# Patient Record
Sex: Female | Born: 1945
Health system: Southern US, Community
[De-identification: ages and names within clinical notes are randomized; demographics above are authoritative.]

## PROBLEM LIST (undated history)

## (undated) DIAGNOSIS — Z9109 Other allergy status, other than to drugs and biological substances: Secondary | ICD-10-CM

## (undated) DIAGNOSIS — H409 Unspecified glaucoma: Secondary | ICD-10-CM

## (undated) DIAGNOSIS — M8580 Other specified disorders of bone density and structure, unspecified site: Secondary | ICD-10-CM

## (undated) DIAGNOSIS — G4700 Insomnia, unspecified: Secondary | ICD-10-CM

## (undated) DIAGNOSIS — F32A Depression, unspecified: Secondary | ICD-10-CM

## (undated) DIAGNOSIS — L309 Dermatitis, unspecified: Secondary | ICD-10-CM

## (undated) DIAGNOSIS — F329 Major depressive disorder, single episode, unspecified: Secondary | ICD-10-CM

## (undated) DIAGNOSIS — C55 Malignant neoplasm of uterus, part unspecified: Secondary | ICD-10-CM

## (undated) DIAGNOSIS — C541 Malignant neoplasm of endometrium: Secondary | ICD-10-CM

## (undated) DIAGNOSIS — J45909 Unspecified asthma, uncomplicated: Secondary | ICD-10-CM

## (undated) DIAGNOSIS — R739 Hyperglycemia, unspecified: Secondary | ICD-10-CM

## (undated) DIAGNOSIS — E785 Hyperlipidemia, unspecified: Secondary | ICD-10-CM

## (undated) DIAGNOSIS — E278 Other specified disorders of adrenal gland: Secondary | ICD-10-CM

## (undated) DIAGNOSIS — B019 Varicella without complication: Secondary | ICD-10-CM

## (undated) HISTORY — DX: Malignant neoplasm of endometrium: C54.1

## (undated) HISTORY — DX: Other specified disorders of adrenal gland: E27.8

## (undated) HISTORY — DX: Hyperlipidemia, unspecified: E78.5

## (undated) HISTORY — PX: CATARACT EXTRACTION: SUR2

## (undated) HISTORY — PX: OTHER SURGICAL HISTORY: SHX169

## (undated) HISTORY — DX: Dermatitis, unspecified: L30.9

## (undated) HISTORY — DX: Insomnia, unspecified: G47.00

## (undated) HISTORY — PX: ECTOPIC PREGNANCY SURGERY: SHX613

## (undated) HISTORY — PX: ABDOMINAL HYSTERECTOMY: SHX81

## (undated) HISTORY — DX: Unspecified asthma, uncomplicated: J45.909

## (undated) HISTORY — DX: Malignant neoplasm of uterus, part unspecified: C55

## (undated) HISTORY — PX: BREAST SURGERY: SHX581

## (undated) HISTORY — DX: Unspecified glaucoma: H40.9

## (undated) HISTORY — DX: Varicella without complication: B01.9

## (undated) HISTORY — DX: Hyperglycemia, unspecified: R73.9

## (undated) HISTORY — DX: Depression, unspecified: F32.A

## (undated) HISTORY — DX: Other allergy status, other than to drugs and biological substances: Z91.09

## (undated) HISTORY — DX: Major depressive disorder, single episode, unspecified: F32.9

## (undated) HISTORY — DX: Other specified disorders of bone density and structure, unspecified site: M85.80

---

## 1997-11-07 HISTORY — PX: ABDOMINAL HYSTERECTOMY: SHX81

## 2001-11-07 HISTORY — PX: BREAST SURGERY: SHX581

## 2008-08-27 ENCOUNTER — Encounter: Payer: Self-pay | Admitting: Endocrinology

## 2011-11-15 DIAGNOSIS — M5020 Other cervical disc displacement, unspecified cervical region: Secondary | ICD-10-CM | POA: Diagnosis not present

## 2011-11-15 DIAGNOSIS — M9981 Other biomechanical lesions of cervical region: Secondary | ICD-10-CM | POA: Diagnosis not present

## 2011-11-15 DIAGNOSIS — M5412 Radiculopathy, cervical region: Secondary | ICD-10-CM | POA: Diagnosis not present

## 2011-11-15 DIAGNOSIS — M5126 Other intervertebral disc displacement, lumbar region: Secondary | ICD-10-CM | POA: Diagnosis not present

## 2011-11-23 DIAGNOSIS — M5126 Other intervertebral disc displacement, lumbar region: Secondary | ICD-10-CM | POA: Diagnosis not present

## 2011-11-23 DIAGNOSIS — M9981 Other biomechanical lesions of cervical region: Secondary | ICD-10-CM | POA: Diagnosis not present

## 2011-11-23 DIAGNOSIS — M5412 Radiculopathy, cervical region: Secondary | ICD-10-CM | POA: Diagnosis not present

## 2011-11-23 DIAGNOSIS — M5020 Other cervical disc displacement, unspecified cervical region: Secondary | ICD-10-CM | POA: Diagnosis not present

## 2011-12-19 DIAGNOSIS — H409 Unspecified glaucoma: Secondary | ICD-10-CM | POA: Diagnosis not present

## 2011-12-19 DIAGNOSIS — J309 Allergic rhinitis, unspecified: Secondary | ICD-10-CM | POA: Diagnosis not present

## 2011-12-19 DIAGNOSIS — L259 Unspecified contact dermatitis, unspecified cause: Secondary | ICD-10-CM | POA: Diagnosis not present

## 2011-12-19 DIAGNOSIS — J45909 Unspecified asthma, uncomplicated: Secondary | ICD-10-CM | POA: Diagnosis not present

## 2011-12-21 DIAGNOSIS — M5020 Other cervical disc displacement, unspecified cervical region: Secondary | ICD-10-CM | POA: Diagnosis not present

## 2011-12-21 DIAGNOSIS — M9981 Other biomechanical lesions of cervical region: Secondary | ICD-10-CM | POA: Diagnosis not present

## 2011-12-21 DIAGNOSIS — M5412 Radiculopathy, cervical region: Secondary | ICD-10-CM | POA: Diagnosis not present

## 2012-01-18 DIAGNOSIS — M5412 Radiculopathy, cervical region: Secondary | ICD-10-CM | POA: Diagnosis not present

## 2012-01-18 DIAGNOSIS — M5020 Other cervical disc displacement, unspecified cervical region: Secondary | ICD-10-CM | POA: Diagnosis not present

## 2012-01-18 DIAGNOSIS — M9981 Other biomechanical lesions of cervical region: Secondary | ICD-10-CM | POA: Diagnosis not present

## 2012-02-01 DIAGNOSIS — H4010X0 Unspecified open-angle glaucoma, stage unspecified: Secondary | ICD-10-CM | POA: Diagnosis not present

## 2012-02-02 DIAGNOSIS — H4011X0 Primary open-angle glaucoma, stage unspecified: Secondary | ICD-10-CM | POA: Diagnosis not present

## 2012-03-22 DIAGNOSIS — L259 Unspecified contact dermatitis, unspecified cause: Secondary | ICD-10-CM | POA: Diagnosis not present

## 2012-03-22 DIAGNOSIS — H409 Unspecified glaucoma: Secondary | ICD-10-CM | POA: Diagnosis not present

## 2012-03-22 DIAGNOSIS — J45909 Unspecified asthma, uncomplicated: Secondary | ICD-10-CM | POA: Diagnosis not present

## 2012-03-22 DIAGNOSIS — J309 Allergic rhinitis, unspecified: Secondary | ICD-10-CM | POA: Diagnosis not present

## 2012-04-04 DIAGNOSIS — Z1231 Encounter for screening mammogram for malignant neoplasm of breast: Secondary | ICD-10-CM | POA: Diagnosis not present

## 2012-04-04 DIAGNOSIS — M899 Disorder of bone, unspecified: Secondary | ICD-10-CM | POA: Diagnosis not present

## 2012-04-04 DIAGNOSIS — Z01419 Encounter for gynecological examination (general) (routine) without abnormal findings: Secondary | ICD-10-CM | POA: Diagnosis not present

## 2012-04-04 DIAGNOSIS — Z8542 Personal history of malignant neoplasm of other parts of uterus: Secondary | ICD-10-CM | POA: Diagnosis not present

## 2012-04-04 DIAGNOSIS — M949 Disorder of cartilage, unspecified: Secondary | ICD-10-CM | POA: Diagnosis not present

## 2012-04-24 DIAGNOSIS — M5126 Other intervertebral disc displacement, lumbar region: Secondary | ICD-10-CM | POA: Diagnosis not present

## 2012-04-24 DIAGNOSIS — M999 Biomechanical lesion, unspecified: Secondary | ICD-10-CM | POA: Diagnosis not present

## 2012-04-24 DIAGNOSIS — M5020 Other cervical disc displacement, unspecified cervical region: Secondary | ICD-10-CM | POA: Diagnosis not present

## 2012-04-24 DIAGNOSIS — M5430 Sciatica, unspecified side: Secondary | ICD-10-CM | POA: Diagnosis not present

## 2012-06-07 ENCOUNTER — Other Ambulatory Visit: Payer: Self-pay | Admitting: Speech Pathology

## 2012-06-07 ENCOUNTER — Encounter: Payer: Self-pay | Admitting: Family

## 2012-06-07 ENCOUNTER — Ambulatory Visit (INDEPENDENT_AMBULATORY_CARE_PROVIDER_SITE_OTHER): Payer: Medicare Other | Admitting: Family

## 2012-06-07 VITALS — BP 140/84 | HR 80 | Temp 98.2°F | Resp 16 | Ht 77.0 in | Wt 125.0 lb

## 2012-06-07 DIAGNOSIS — R51 Headache: Secondary | ICD-10-CM

## 2012-06-07 DIAGNOSIS — H31099 Other chorioretinal scars, unspecified eye: Secondary | ICD-10-CM | POA: Diagnosis not present

## 2012-06-07 DIAGNOSIS — H43819 Vitreous degeneration, unspecified eye: Secondary | ICD-10-CM | POA: Diagnosis not present

## 2012-06-07 DIAGNOSIS — H538 Other visual disturbances: Secondary | ICD-10-CM | POA: Diagnosis not present

## 2012-06-07 DIAGNOSIS — R5381 Other malaise: Secondary | ICD-10-CM

## 2012-06-07 DIAGNOSIS — H4010X0 Unspecified open-angle glaucoma, stage unspecified: Secondary | ICD-10-CM | POA: Diagnosis not present

## 2012-06-07 DIAGNOSIS — R5383 Other fatigue: Secondary | ICD-10-CM | POA: Diagnosis not present

## 2012-06-07 DIAGNOSIS — H2510 Age-related nuclear cataract, unspecified eye: Secondary | ICD-10-CM | POA: Diagnosis not present

## 2012-06-07 DIAGNOSIS — R519 Headache, unspecified: Secondary | ICD-10-CM

## 2012-06-07 LAB — SEDIMENTATION RATE: Sed Rate: 9 mm/hr (ref 0–22)

## 2012-06-07 LAB — CBC WITH DIFFERENTIAL/PLATELET
Basophils Relative: 0.6 % (ref 0.0–3.0)
Eosinophils Relative: 5.7 % — ABNORMAL HIGH (ref 0.0–5.0)
Lymphocytes Relative: 20.6 % (ref 12.0–46.0)
Neutrophils Relative %: 66.2 % (ref 43.0–77.0)
RBC: 4.32 Mil/uL (ref 3.87–5.11)
WBC: 9.7 10*3/uL (ref 4.5–10.5)

## 2012-06-07 LAB — COMPREHENSIVE METABOLIC PANEL
Albumin: 3.9 g/dL (ref 3.5–5.2)
BUN: 18 mg/dL (ref 6–23)
Calcium: 9.5 mg/dL (ref 8.4–10.5)
Chloride: 102 mEq/L (ref 96–112)
Glucose, Bld: 85 mg/dL (ref 70–99)
Potassium: 3.9 mEq/L (ref 3.5–5.1)

## 2012-06-07 LAB — GLUCOSE, POCT (MANUAL RESULT ENTRY): POC Glucose: 98 mg/dl (ref 70–99)

## 2012-06-07 NOTE — Patient Instructions (Addendum)
Blurred Vision You have been seen today complaining of blurred vision. This means you have a loss of ability to see small details.  CAUSES  Blurred vision can be a symptom of underlying eye problems, such as:  Aging of the eye (presbyopia).   Glaucoma.   Cataracts.   Eye infection.   Eye-related migraine.   Diabetes mellitus.   Fatigue.   Migraine headaches.   High blood pressure.   Breakdown of the back of the eye (macular degeneration).   Problems caused by some medications.  The most common cause of blurred vision is the need for eyeglasses or a new prescription. Today in the emergency department, no cause for your blurred vision can be found. SYMPTOMS  Blurred vision is the loss of visual sharpness and detail (acuity). DIAGNOSIS  Should blurred vision continue, you should see your caregiver. If your caregiver is your primary care physician, he or she may choose to refer you to another specialist.  TREATMENT  Do not ignore your blurred vision. Make sure to have it checked out to see if further treatment or referral is necessary. SEEK MEDICAL CARE IF:  You are unable to get into a specialist so we can help you with a referral. SEEK IMMEDIATE MEDICAL CARE IF: You have severe eye pain, severe headache, or sudden loss of vision. MAKE SURE YOU:   Understand these instructions.   Will watch your condition.   Will get help right away if you are not doing well or get worse.  Document Released: 10/27/2003 Document Revised: 10/13/2011 Document Reviewed: 05/28/2008 [facility] Patient Information 2012 [place], [place].  Headaches, Frequently Asked Questions MIGRAINE HEADACHES Q: What is migraine? What causes it? How can I treat it? A: Generally, migraine headaches begin as a dull ache. Then they develop into a constant, throbbing, and pulsating pain. You may experience pain at the temples. You may experience pain at the front or back of one or both sides of the head. The pain is  usually accompanied by a combination of:  Nausea.   Vomiting.   Sensitivity to light and noise.  Some people (about 15%) experience an aura (see below) before an attack. The cause of migraine is believed to be chemical reactions in the brain. Treatment for migraine may include over-the-counter or prescription medications. It may also include self-help techniques. These include relaxation training and biofeedback.  Q: What is an aura? A: About 15% of people with migraine get an "aura". This is a sign of neurological symptoms that occur before a migraine headache. You may see wavy or jagged lines, dots, or flashing lights. You might experience tunnel vision or blind spots in one or both eyes. The aura can include visual or auditory hallucinations (something imagined). It may include disruptions in smell (such as strange odors), taste or touch. Other symptoms include:  Numbness.   A "pins and needles" sensation.   Difficulty in recalling or speaking the correct word.  These neurological events may last as long as 60 minutes. These symptoms will fade as the headache begins. Q: What is a trigger? A: Certain physical or environmental factors can lead to or "trigger" a migraine. These include:  Foods.   Hormonal changes.   Weather.   Stress.  It is important to remember that triggers are different for everyone. To help prevent migraine attacks, you need to figure out which triggers affect you. Keep a headache diary. This is a good way to track triggers. The diary will help you talk to  your healthcare professional about your condition. Q: Does weather affect migraines? A: Bright sunshine, hot, humid conditions, and drastic changes in barometric pressure may lead to, or "trigger," a migraine attack in some people. But studies have shown that weather does not act as a trigger for everyone with migraines. Q: What is the link between migraine and hormones? A: Hormones start and regulate many of  your body's functions. Hormones keep your body in balance within a constantly changing environment. The levels of hormones in your body are unbalanced at times. Examples are during menstruation, pregnancy, or menopause. That can lead to a migraine attack. In fact, about three quarters of all women with migraine report that their attacks are related to the menstrual cycle.  Q: Is there an increased risk of stroke for migraine sufferers? A: The likelihood of a migraine attack causing a stroke is very remote. That is not to say that migraine sufferers cannot have a stroke associated with their migraines. In persons under age 86, the most common associated factor for stroke is migraine headache. But over the course of a person's normal life span, the occurrence of migraine headache may actually be associated with a reduced risk of dying from cerebrovascular disease due to stroke.  Q: What are acute medications for migraine? A: Acute medications are used to treat the pain of the headache after it has started. Examples over-the-counter medications, NSAIDs, ergots, and triptans.  Q: What are the triptans? A: Triptans are the newest class of abortive medications. They are specifically targeted to treat migraine. Triptans are vasoconstrictors. They moderate some chemical reactions in the brain. The triptans work on receptors in your brain. Triptans help to restore the balance of a neurotransmitter called serotonin. Fluctuations in levels of serotonin are thought to be a main cause of migraine.  Q: Are over-the-counter medications for migraine effective? A: Over-the-counter, or "OTC," medications may be effective in relieving mild to moderate pain and associated symptoms of migraine. But you should see your caregiver before beginning any treatment regimen for migraine.  Q: What are preventive medications for migraine? A: Preventive medications for migraine are sometimes referred to as "prophylactic" treatments. They  are used to reduce the frequency, severity, and length of migraine attacks. Examples of preventive medications include antiepileptic medications, antidepressants, beta-blockers, calcium channel blockers, and NSAIDs (nonsteroidal anti-inflammatory drugs). Q: Why are anticonvulsants used to treat migraine? A: During the past few years, there has been an increased interest in antiepileptic drugs for the prevention of migraine. They are sometimes referred to as "anticonvulsants". Both epilepsy and migraine may be caused by similar reactions in the brain.  Q: Why are antidepressants used to treat migraine? A: Antidepressants are typically used to treat people with depression. They may reduce migraine frequency by regulating chemical levels, such as serotonin, in the brain.  Q: What alternative therapies are used to treat migraine? A: The term "alternative therapies" is often used to describe treatments considered outside the scope of conventional Western medicine. Examples of alternative therapy include acupuncture, acupressure, and yoga. Another common alternative treatment is herbal therapy. Some herbs are believed to relieve headache pain. Always discuss alternative therapies with your caregiver before proceeding. Some herbal products contain arsenic and other toxins. TENSION HEADACHES Q: What is a tension-type headache? What causes it? How can I treat it? A: Tension-type headaches occur randomly. They are often the result of temporary stress, anxiety, fatigue, or anger. Symptoms include soreness in your temples, a tightening band-like sensation around your head (a "  vice-like" ache). Symptoms can also include a pulling feeling, pressure sensations, and contracting head and neck muscles. The headache begins in your forehead, temples, or the back of your head and neck. Treatment for tension-type headache may include over-the-counter or prescription medications. Treatment may also include self-help techniques  such as relaxation training and biofeedback. CLUSTER HEADACHES Q: What is a cluster headache? What causes it? How can I treat it? A: Cluster headache gets its name because the attacks come in groups. The pain arrives with little, if any, warning. It is usually on one side of the head. A tearing or bloodshot eye and a runny nose on the same side of the headache may also accompany the pain. Cluster headaches are believed to be caused by chemical reactions in the brain. They have been described as the most severe and intense of any headache type. Treatment for cluster headache includes prescription medication and oxygen. SINUS HEADACHES Q: What is a sinus headache? What causes it? How can I treat it? A: When a cavity in the bones of the face and skull (a sinus) becomes inflamed, the inflammation will cause localized pain. This condition is usually the result of an allergic reaction, a tumor, or an infection. If your headache is caused by a sinus blockage, such as an infection, you will probably have a fever. An x-ray will confirm a sinus blockage. Your caregiver's treatment might include antibiotics for the infection, as well as antihistamines or decongestants.  REBOUND HEADACHES Q: What is a rebound headache? What causes it? How can I treat it? A: A pattern of taking acute headache medications too often can lead to a condition known as "rebound headache." A pattern of taking too much headache medication includes taking it more than 2 days per week or in excessive amounts. That means more than the label or a caregiver advises. With rebound headaches, your medications not only stop relieving pain, they actually begin to cause headaches. Doctors treat rebound headache by tapering the medication that is being overused. Sometimes your caregiver will gradually substitute a different type of treatment or medication. Stopping may be a challenge. Regularly overusing a medication increases the potential for serious  side effects. Consult a caregiver if you regularly use headache medications more than 2 days per week or more than the label advises. ADDITIONAL QUESTIONS AND ANSWERS Q: What is biofeedback? A: Biofeedback is a self-help treatment. Biofeedback uses special equipment to monitor your body's involuntary physical responses. Biofeedback monitors:  Breathing.   Pulse.   Heart rate.   Temperature.   Muscle tension.   Brain activity.  Biofeedback helps you refine and perfect your relaxation exercises. You learn to control the physical responses that are related to stress. Once the technique has been mastered, you do not need the equipment any more. Q: Are headaches hereditary? A: Four out of five (80%) of people that suffer report a family history of migraine. Scientists are not sure if this is genetic or a family predisposition. Despite the uncertainty, a child has a 50% chance of having migraine if one parent suffers. The child has a 75% chance if both parents suffer.  Q: Can children get headaches? A: By the time they reach high school, most young people have experienced some type of headache. Many safe and effective approaches or medications can prevent a headache from occurring or stop it after it has begun.  Q: What type of doctor should I see to diagnose and treat my headache? A: Start with your  primary caregiver. Discuss his or her experience and approach to headaches. Discuss methods of classification, diagnosis, and treatment. Your caregiver may decide to recommend you to a headache specialist, depending upon your symptoms or other physical conditions. Having diabetes, allergies, etc., may require a more comprehensive and inclusive approach to your headache. The National Headache Foundation will provide, upon request, a list of [facility] physician members in your state. Document Released: 01/14/2004 Document Revised: 10/13/2011 Document Reviewed: 06/23/2008 [facility] Patient Information 2012  [place], [place].

## 2012-06-07 NOTE — Progress Notes (Signed)
Subjective:    Patient ID: Kelly Petersen, female    DOB: 1946/09/12, 66 y.o.   MRN: [id number]  HPI 66 year old WF, new patient to the practice, is in with c/o aura, headache to the back of her head, blurred vision, and fatigue. She reports symptoms beginning suddenly 10 days ago. She is worried she has had a TIA. She has a history of migraines without headache but has had aura. Rates headache 4/10, lasting 4-6 hours a day. She takes ASA that helps. Denies nausea, vomiting, sensitivity to light or noise. She is stressed having 2 sick parents. She has a mother with advanced Alzheimer's disease and a father with cancer. She is also concerned that her blood pressure is elevated more than usual.      Review of Systems  Constitutional: Positive for fatigue.  Eyes: Positive for visual disturbance.  Respiratory: Negative.   Cardiovascular: Negative.   Gastrointestinal: Negative.   Genitourinary: Negative.   Musculoskeletal: Negative.   Skin: Negative.   Neurological: Positive for weakness and headaches.  Hematological: Negative.   Psychiatric/Behavioral: Negative.    Past Medical History  Diagnosis Date  . Endometrial cancer   . Chicken pox   . Depression   . Hyperlipidemia   . Glaucoma     History   Social History  . Marital Status: Single    Spouse Name: N/A    Number of Children: N/A  . Years of Education: N/A   Occupational History  . Not on file.   Social History Main Topics  . Smoking status: Never Smoker   . Smokeless tobacco: Not on file  . Alcohol Use: Yes  . Drug Use: No  . Sexually Active: Not on file   Other Topics Concern  . Not on file   Social History Narrative  . No narrative on file    Past Surgical History  Procedure Date  . Breast surgery   . 2004   . Abdominal hysterectomy     Family History  Problem Relation Age of Onset  . Lung cancer    . Hyperlipidemia      Not on File  Current Outpatient Prescriptions on File Prior to Visit    Medication Sig Dispense Refill  . mometasone (ASMANEX) 220 MCG/INH inhaler Inhale 4 puffs into the lungs daily.      . montelukast (SINGULAIR) 10 MG tablet Take 10 mg by mouth at bedtime.      . rosuvastatin (CRESTOR) 5 MG tablet Take 5 mg by mouth daily.      . sertraline (ZOLOFT) 50 MG tablet Take 50 mg by mouth daily.      . traZODone (DESYREL) 50 MG tablet Take 50 mg by mouth at bedtime.        BP 140/84  Pulse 80  Temp 98.2 F (36.8 C)  Resp 16  Ht 6\' 5"  (1.956 m)  Wt 125 lb (56.7 kg)  BMI 14.82 kg/m2chart    Objective:   Physical Exam  Constitutional: She is oriented to person, place, and time. She appears well-developed and well-nourished.  HENT:  Right Ear: External ear normal.  Left Ear: External ear normal.  Nose: Nose normal.  Mouth/Throat: Oropharynx is clear and moist.  Eyes: Conjunctivae and EOM are normal. Pupils are equal, round, and reactive to light.  Neck: Normal range of motion. Neck supple. No thyromegaly present.  Cardiovascular: Normal rate, regular rhythm and normal heart sounds.   Pulmonary/Chest: Effort normal and breath sounds normal.  Abdominal: Soft.  Bowel sounds are normal.  Musculoskeletal: Normal range of motion. She exhibits no edema and no tenderness.  Neurological: She is alert and oriented to person, place, and time. She has normal reflexes. She displays normal reflexes. No cranial nerve deficit. She exhibits normal muscle tone. Coordination normal.  Skin: Skin is warm and dry.  Psychiatric: She has a normal mood and affect.          Assessment & Plan:  Assessment: Headache-occipital, Fatigue, Blurred Vision   Plan: Labs sent. CT scan of the head ordered. Will notify patient pending results.

## 2012-06-08 ENCOUNTER — Ambulatory Visit (INDEPENDENT_AMBULATORY_CARE_PROVIDER_SITE_OTHER)
Admission: RE | Admit: 2012-06-08 | Discharge: 2012-06-08 | Disposition: A | Discharge status: Home / Self Care | Payer: Medicare Other | Source: Ambulatory Visit | Attending: Family | Admitting: Family

## 2012-06-08 ENCOUNTER — Telehealth: Payer: Self-pay | Admitting: Family

## 2012-06-08 DIAGNOSIS — H538 Other visual disturbances: Secondary | ICD-10-CM | POA: Diagnosis not present

## 2012-06-08 DIAGNOSIS — R5381 Other malaise: Secondary | ICD-10-CM | POA: Diagnosis not present

## 2012-06-08 DIAGNOSIS — R5383 Other fatigue: Secondary | ICD-10-CM | POA: Diagnosis not present

## 2012-06-08 DIAGNOSIS — R519 Headache, unspecified: Secondary | ICD-10-CM

## 2012-06-08 DIAGNOSIS — R51 Headache: Secondary | ICD-10-CM

## 2012-06-08 NOTE — Telephone Encounter (Signed)
Pt aware or lab and CT results and results mailed, per pt request

## 2012-06-08 NOTE — Telephone Encounter (Signed)
Pt would like ct scan/blood work results

## 2012-06-11 ENCOUNTER — Telehealth: Payer: Self-pay | Admitting: Family

## 2012-06-11 NOTE — Telephone Encounter (Signed)
Consider an med for stress like Lexapro once daily. Let me know what she wants to do.

## 2012-06-11 NOTE — Telephone Encounter (Signed)
Caller: Kelly Petersen; PCP: Adline Mango; CB#: [phone]; ; ; Patient was seen on 06/07/12 for headaches, blurry vision, etc. Patient was sent for CT scan and had a eye doctor visit. Patient was called with a negative result of CT scan. Patient reports headache x 2 weeks (05/29/12). Patient concerned that her blood pressure was elevated when she went to see the doctor. She wants to know what to do for the headaches and blurry vision. All emergent symptoms ruled out per Hypertension, Diagnosed or Suspected guideline with exception to "Single elevated blood pressure reading and has questions." Home care advice given.

## 2012-07-04 ENCOUNTER — Other Ambulatory Visit: Payer: Self-pay | Admitting: Specialist

## 2012-07-04 DIAGNOSIS — Z79899 Other long term (current) drug therapy: Secondary | ICD-10-CM | POA: Diagnosis not present

## 2012-07-04 DIAGNOSIS — H539 Unspecified visual disturbance: Secondary | ICD-10-CM

## 2012-07-04 DIAGNOSIS — G43119 Migraine with aura, intractable, without status migrainosus: Secondary | ICD-10-CM | POA: Diagnosis not present

## 2012-07-05 ENCOUNTER — Ambulatory Visit
Admission: RE | Admit: 2012-07-05 | Discharge: 2012-07-05 | Disposition: A | Discharge status: Home / Self Care | Payer: Medicare Other | Source: Ambulatory Visit | Attending: Specialist | Admitting: Specialist

## 2012-07-05 DIAGNOSIS — R51 Headache: Secondary | ICD-10-CM | POA: Diagnosis not present

## 2012-07-05 DIAGNOSIS — H539 Unspecified visual disturbance: Secondary | ICD-10-CM

## 2012-07-05 MED ORDER — GADOBENATE DIMEGLUMINE 529 MG/ML IV SOLN
10.0000 mL | Freq: Once | INTRAVENOUS | Status: AC | PRN
  Administered 2012-07-05: 10 mL via INTRAVENOUS

## 2012-07-11 ENCOUNTER — Other Ambulatory Visit: Payer: Medicare Other

## 2012-07-23 DIAGNOSIS — Z1231 Encounter for screening mammogram for malignant neoplasm of breast: Secondary | ICD-10-CM | POA: Diagnosis not present

## 2012-07-23 DIAGNOSIS — M899 Disorder of bone, unspecified: Secondary | ICD-10-CM | POA: Diagnosis not present

## 2012-07-23 DIAGNOSIS — M949 Disorder of cartilage, unspecified: Secondary | ICD-10-CM | POA: Diagnosis not present

## 2012-07-24 DIAGNOSIS — M9981 Other biomechanical lesions of cervical region: Secondary | ICD-10-CM | POA: Diagnosis not present

## 2012-07-24 DIAGNOSIS — R51 Headache: Secondary | ICD-10-CM | POA: Diagnosis not present

## 2012-07-24 DIAGNOSIS — M5412 Radiculopathy, cervical region: Secondary | ICD-10-CM | POA: Diagnosis not present

## 2012-07-24 DIAGNOSIS — M5020 Other cervical disc displacement, unspecified cervical region: Secondary | ICD-10-CM | POA: Diagnosis not present

## 2012-07-26 DIAGNOSIS — L819 Disorder of pigmentation, unspecified: Secondary | ICD-10-CM | POA: Diagnosis not present

## 2012-07-26 DIAGNOSIS — D485 Neoplasm of uncertain behavior of skin: Secondary | ICD-10-CM | POA: Diagnosis not present

## 2012-07-26 DIAGNOSIS — L820 Inflamed seborrheic keratosis: Secondary | ICD-10-CM | POA: Diagnosis not present

## 2012-07-26 DIAGNOSIS — D239 Other benign neoplasm of skin, unspecified: Secondary | ICD-10-CM | POA: Diagnosis not present

## 2012-07-26 DIAGNOSIS — D492 Neoplasm of unspecified behavior of bone, soft tissue, and skin: Secondary | ICD-10-CM | POA: Diagnosis not present

## 2012-08-07 DIAGNOSIS — H4011X0 Primary open-angle glaucoma, stage unspecified: Secondary | ICD-10-CM | POA: Diagnosis not present

## 2012-08-09 DIAGNOSIS — Z23 Encounter for immunization: Secondary | ICD-10-CM | POA: Diagnosis not present

## 2012-08-09 DIAGNOSIS — J45909 Unspecified asthma, uncomplicated: Secondary | ICD-10-CM | POA: Diagnosis not present

## 2012-08-09 DIAGNOSIS — H409 Unspecified glaucoma: Secondary | ICD-10-CM | POA: Diagnosis not present

## 2012-08-09 DIAGNOSIS — J309 Allergic rhinitis, unspecified: Secondary | ICD-10-CM | POA: Diagnosis not present

## 2012-08-13 DIAGNOSIS — C7490 Malignant neoplasm of unspecified part of unspecified adrenal gland: Secondary | ICD-10-CM | POA: Diagnosis not present

## 2012-08-21 DIAGNOSIS — M5020 Other cervical disc displacement, unspecified cervical region: Secondary | ICD-10-CM | POA: Diagnosis not present

## 2012-08-21 DIAGNOSIS — M9981 Other biomechanical lesions of cervical region: Secondary | ICD-10-CM | POA: Diagnosis not present

## 2012-08-21 DIAGNOSIS — M5412 Radiculopathy, cervical region: Secondary | ICD-10-CM | POA: Diagnosis not present

## 2012-08-21 DIAGNOSIS — R51 Headache: Secondary | ICD-10-CM | POA: Diagnosis not present

## 2012-09-25 DIAGNOSIS — M999 Biomechanical lesion, unspecified: Secondary | ICD-10-CM | POA: Diagnosis not present

## 2012-09-25 DIAGNOSIS — M5137 Other intervertebral disc degeneration, lumbosacral region: Secondary | ICD-10-CM | POA: Diagnosis not present

## 2012-09-25 DIAGNOSIS — M5124 Other intervertebral disc displacement, thoracic region: Secondary | ICD-10-CM | POA: Diagnosis not present

## 2012-09-25 DIAGNOSIS — M5414 Radiculopathy, thoracic region: Secondary | ICD-10-CM | POA: Diagnosis not present

## 2012-12-11 DIAGNOSIS — J309 Allergic rhinitis, unspecified: Secondary | ICD-10-CM | POA: Diagnosis not present

## 2012-12-11 DIAGNOSIS — H409 Unspecified glaucoma: Secondary | ICD-10-CM | POA: Diagnosis not present

## 2012-12-11 DIAGNOSIS — J45909 Unspecified asthma, uncomplicated: Secondary | ICD-10-CM | POA: Diagnosis not present

## 2012-12-17 DIAGNOSIS — M5126 Other intervertebral disc displacement, lumbar region: Secondary | ICD-10-CM | POA: Diagnosis not present

## 2012-12-17 DIAGNOSIS — M999 Biomechanical lesion, unspecified: Secondary | ICD-10-CM | POA: Diagnosis not present

## 2012-12-17 DIAGNOSIS — M5430 Sciatica, unspecified side: Secondary | ICD-10-CM | POA: Diagnosis not present

## 2013-03-26 DIAGNOSIS — H4011X0 Primary open-angle glaucoma, stage unspecified: Secondary | ICD-10-CM | POA: Diagnosis not present

## 2013-04-16 DIAGNOSIS — M5412 Radiculopathy, cervical region: Secondary | ICD-10-CM | POA: Diagnosis not present

## 2013-04-16 DIAGNOSIS — M5020 Other cervical disc displacement, unspecified cervical region: Secondary | ICD-10-CM | POA: Diagnosis not present

## 2013-04-16 DIAGNOSIS — M9981 Other biomechanical lesions of cervical region: Secondary | ICD-10-CM | POA: Diagnosis not present

## 2013-04-30 DIAGNOSIS — H4011X0 Primary open-angle glaucoma, stage unspecified: Secondary | ICD-10-CM | POA: Diagnosis not present

## 2013-05-08 ENCOUNTER — Encounter: Payer: Self-pay | Admitting: Family

## 2013-05-08 ENCOUNTER — Ambulatory Visit (INDEPENDENT_AMBULATORY_CARE_PROVIDER_SITE_OTHER): Payer: Medicare Other | Admitting: Family

## 2013-05-08 VITALS — BP 96/60 | HR 90 | Wt 122.0 lb

## 2013-05-08 DIAGNOSIS — L282 Other prurigo: Secondary | ICD-10-CM | POA: Diagnosis not present

## 2013-05-08 DIAGNOSIS — L259 Unspecified contact dermatitis, unspecified cause: Secondary | ICD-10-CM | POA: Diagnosis not present

## 2013-05-08 MED ORDER — METHYLPREDNISOLONE 4 MG PO KIT: PACK | ORAL | Status: AC

## 2013-05-08 NOTE — Progress Notes (Signed)
Subjective:    Patient ID: Kelly Petersen, female    DOB: 1946-01-11, 67 y.o.   MRN: [id number]  HPI Pt is a 67 year old white female who presents to PCP with suspected exposure to poison ivy to BUE and BLE x a week and a half. Rash started after doing yard work at home. Pt describes rash as "itchy"; denies any SOB or chest tightness with the onset of rash. Pt has used peroxide spray to affected areas, in which she states the solution has helped dry out the rash and takes a Benadryl at night which helps relieves the itching. Despite treatments at home new areas of rash continue to appear and the itching is persistent.    Review of Systems  Constitutional: Negative.   HENT: Negative.   Eyes: Negative.   Respiratory: Negative.   Cardiovascular: Negative.   Gastrointestinal: Negative.   Endocrine: Negative.   Genitourinary: Negative.   Musculoskeletal: Negative.   Skin: Positive for rash.  Allergic/Immunologic: Negative.   Neurological: Negative.   Hematological: Negative.   Psychiatric/Behavioral: Negative.        Past Medical History  Diagnosis Date  . Endometrial cancer   . Chicken pox   . Depression   . Hyperlipidemia   . Glaucoma     History   Social History  . Marital Status: Single    Spouse Name: N/A    Number of Children: N/A  . Years of Education: N/A   Occupational History  . Not on file.   Social History Main Topics  . Smoking status: Never Smoker   . Smokeless tobacco: Not on file  . Alcohol Use: Yes  . Drug Use: No  . Sexually Active: Not on file   Other Topics Concern  . Not on file   Social History Narrative  . No narrative on file    Past Surgical History  Procedure Laterality Date  . Breast surgery    . 2004    . Abdominal hysterectomy      Family History  Problem Relation Age of Onset  . Lung cancer    . Hyperlipidemia      No Known Allergies  Current Outpatient Prescriptions on File Prior to Visit  Medication Sig Dispense  Refill  . Brimonidine Tartrate-Timolol (COMBIGAN OP) Apply 1 drop to eye 2 (two) times daily.      . montelukast (SINGULAIR) 10 MG tablet Take 10 mg by mouth at bedtime.      . raloxifene (EVISTA) 60 MG tablet Take 60 mg by mouth daily.      . rosuvastatin (CRESTOR) 5 MG tablet Take 5 mg by mouth daily.      . sertraline (ZOLOFT) 50 MG tablet Take 50 mg by mouth daily.      . traZODone (DESYREL) 50 MG tablet Take 50 mg by mouth at bedtime.      . mometasone (ASMANEX) 220 MCG/INH inhaler Inhale 4 puffs into the lungs daily.       No current facility-administered medications on file prior to visit.    BP 96/60  Pulse 90  Wt 122 lb (55.339 kg)  BMI 14.46 kg/m2  SpO2 97%chart                  Objective:   Physical Exam  Constitutional: She is oriented to person, place, and time. She appears well-developed and well-nourished.  HENT:  Head: Normocephalic and atraumatic.  Eyes: Conjunctivae are normal. Pupils are equal, round, and reactive to light.  Neck: Normal range of motion.  Cardiovascular: Normal rate and regular rhythm.   Pulmonary/Chest: Effort normal and breath sounds normal.  Abdominal: Soft. Bowel sounds are normal.  Musculoskeletal: Normal range of motion.  Neurological: She is alert and oriented to person, place, and time.  Skin: Skin is warm and dry. Rash noted. There is erythema.  Papular rash to BUE and BLE; areas are dry with no drainage/discharge present          Assessment & Plan:  1. Contact Dermatitis  2. Pruritus   Pt prescribed Medrol pack and instructed to continue with Benadryl at home. Pt to contact PCP if symptoms worsen or persist.   Note by [facility], FNP Student

## 2013-05-08 NOTE — Patient Instructions (Signed)

## 2013-06-13 IMAGING — CT CT HEAD W/O CM
1 of 2 series · 16 of 30 positions shown, 20 images · non-contrast
Comparison: None.

CLINICAL DATA: Blurred vision.  Headache

CT HEAD WITHOUT CONTRAST
TECHNIQUE: Contiguous axial images were obtained from the base of
the skull through the vertex without contrast.

[Series 2: head_seq -c 4.5 h37s st · axial · 0.40mm/px · z∈[-112,+14]mm · 16 of 32 slices shown, 20 images]
[im 2/32  brain]
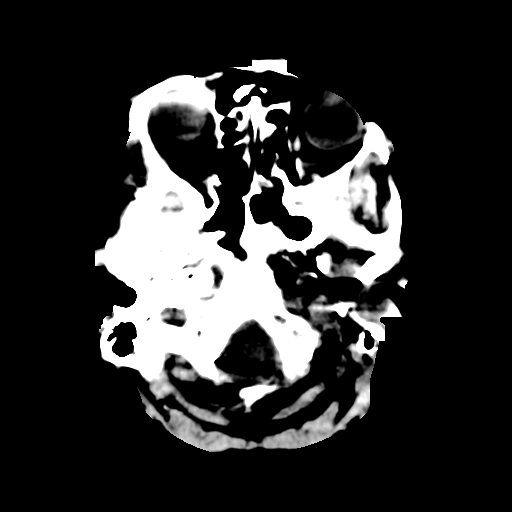
[im 2/32  bone]
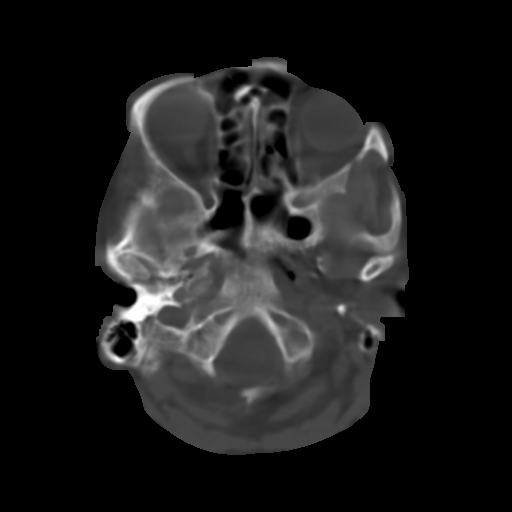
[im 5/32  brain]
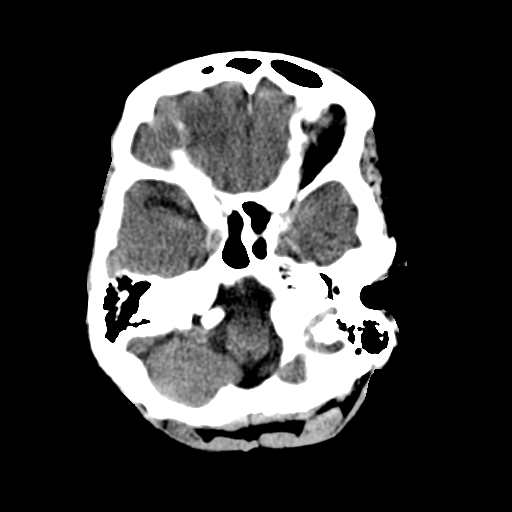
[im 6/32  brain]
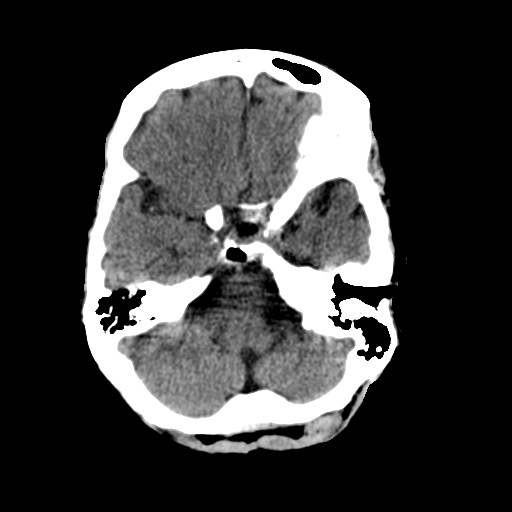
[im 7/32  brain]
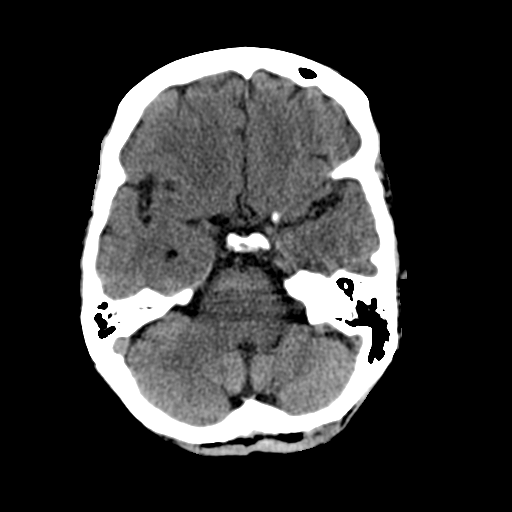
[im 10/32  brain]
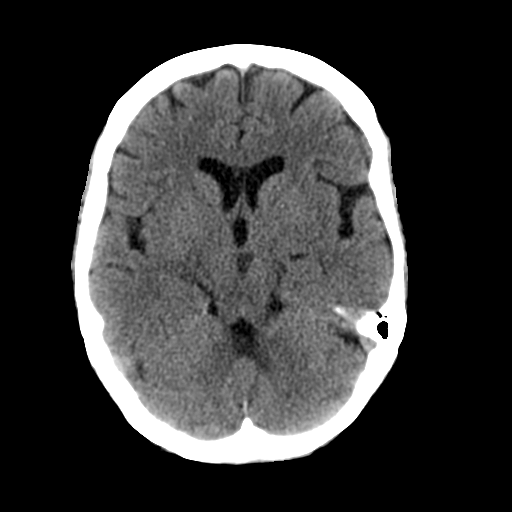
[im 10/32  bone]
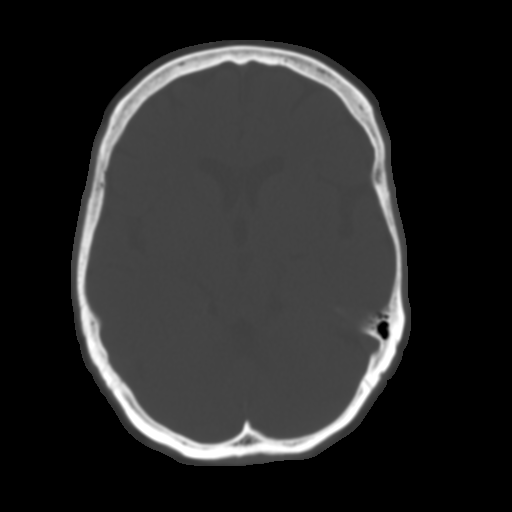
[im 11/32  brain]
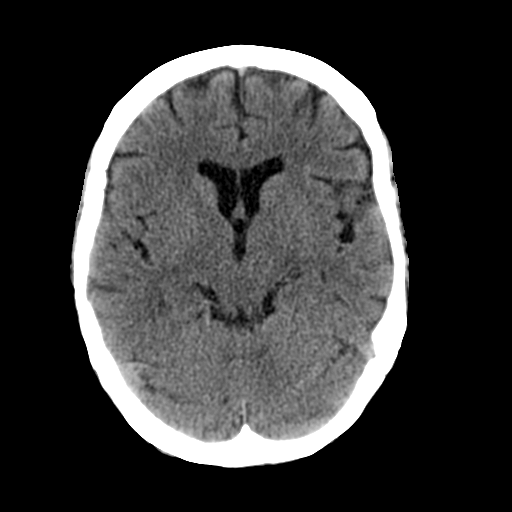
[im 13/32  brain]
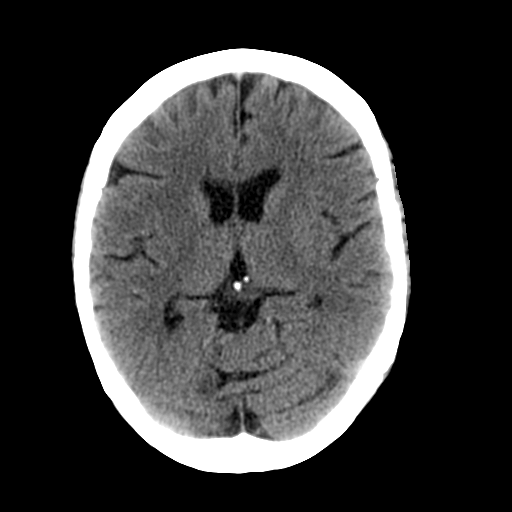
[im 15/32  brain]
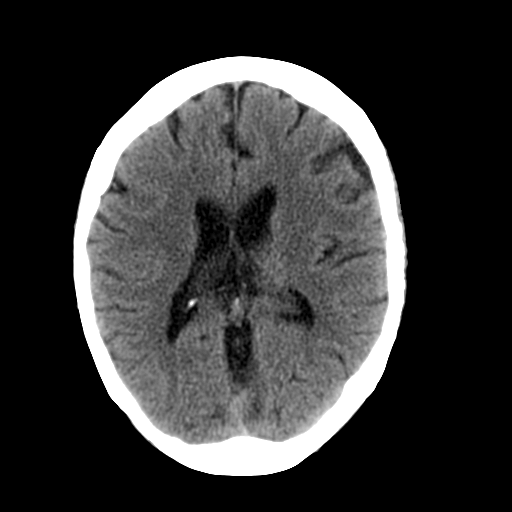
[im 17/32  brain]
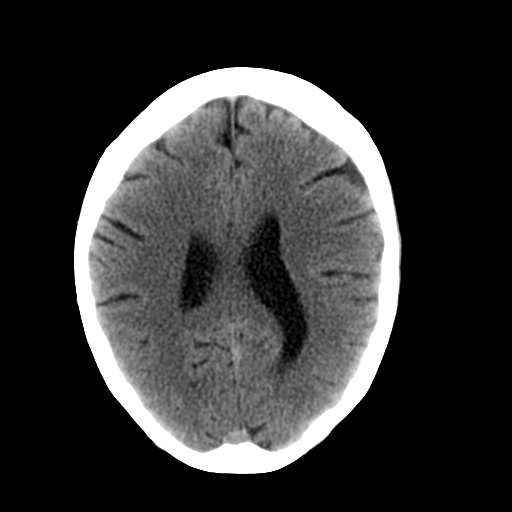
[im 17/32  bone]
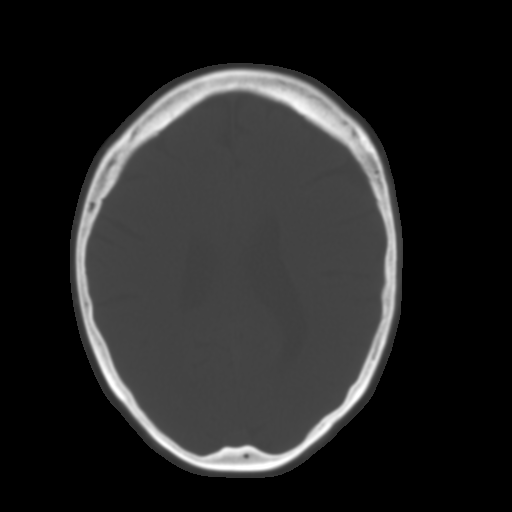
[im 19/32  brain]
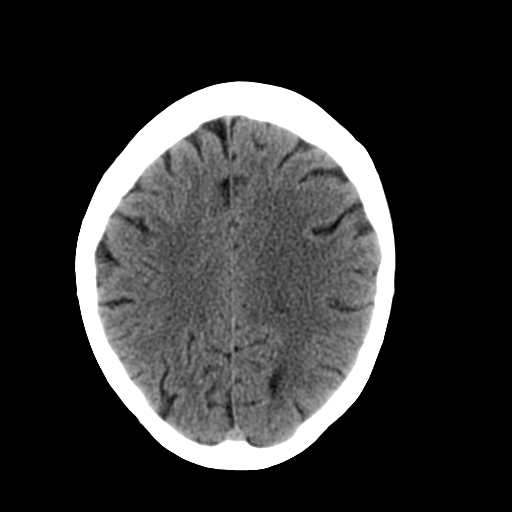
[im 21/32  brain]
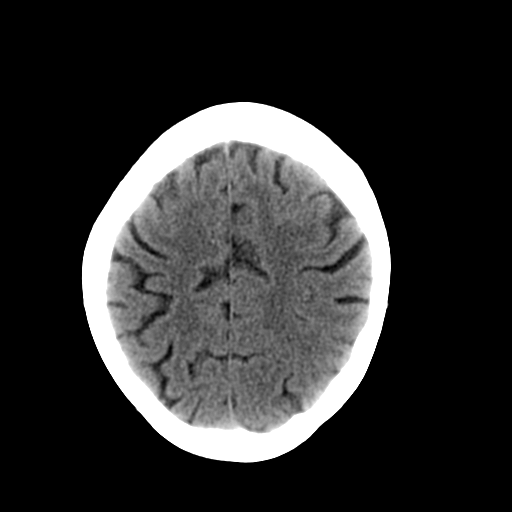
[im 22/32  brain]
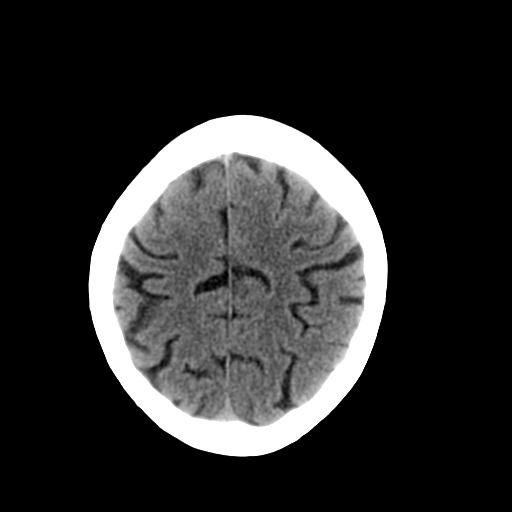
[im 25/32  brain]
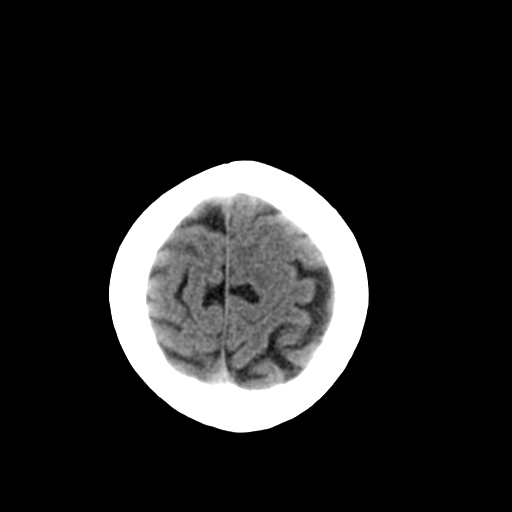
[im 25/32  bone]
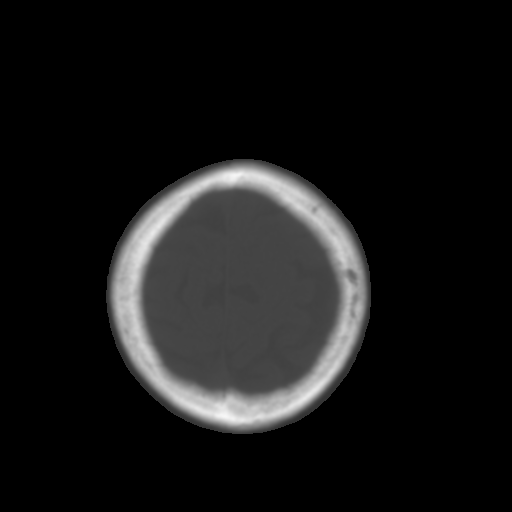
[im 26/32  brain]
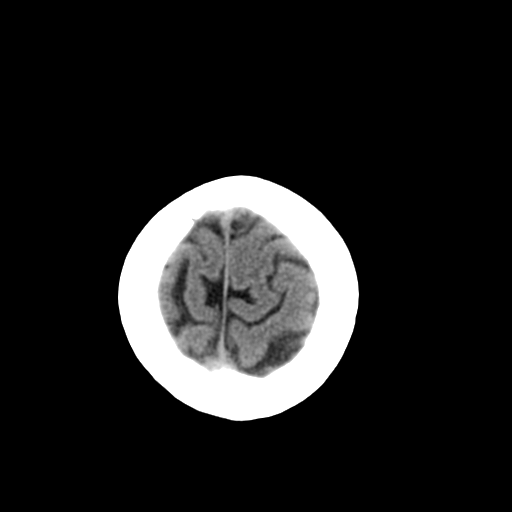
[im 27/32  brain]
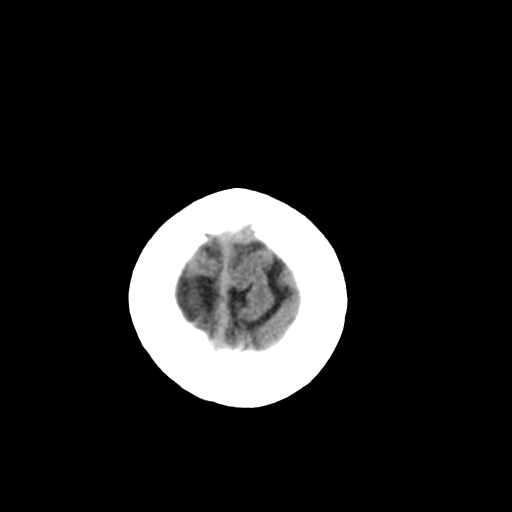
[im 30/32  brain]
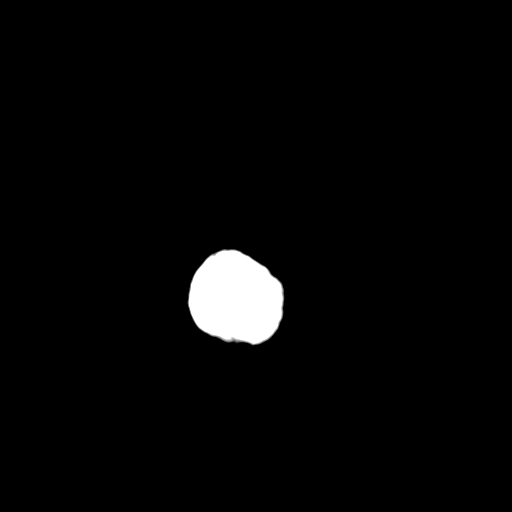

[16 of 30 positions shown; findings below may reference images not displayed]

FINDINGS: Ventricle size is normal.  Age appropriate atrophy.
Negative for acute or chronic infarct.  Negative for hemorrhage or
mass.

Mucosal thickening in the ethmoid sinuses bilaterally.  Calvarium
is intact.
IMPRESSION: No acute intracranial abnormality.

Mild chronic sinusitis.

## 2013-06-25 DIAGNOSIS — H4010X0 Unspecified open-angle glaucoma, stage unspecified: Secondary | ICD-10-CM | POA: Diagnosis not present

## 2013-06-27 DIAGNOSIS — Z23 Encounter for immunization: Secondary | ICD-10-CM | POA: Diagnosis not present

## 2013-06-28 DIAGNOSIS — H409 Unspecified glaucoma: Secondary | ICD-10-CM | POA: Diagnosis not present

## 2013-06-28 DIAGNOSIS — J309 Allergic rhinitis, unspecified: Secondary | ICD-10-CM | POA: Diagnosis not present

## 2013-06-28 DIAGNOSIS — Z8542 Personal history of malignant neoplasm of other parts of uterus: Secondary | ICD-10-CM | POA: Diagnosis not present

## 2013-06-28 DIAGNOSIS — M899 Disorder of bone, unspecified: Secondary | ICD-10-CM | POA: Diagnosis not present

## 2013-06-28 DIAGNOSIS — Z1231 Encounter for screening mammogram for malignant neoplasm of breast: Secondary | ICD-10-CM | POA: Diagnosis not present

## 2013-06-28 DIAGNOSIS — J45909 Unspecified asthma, uncomplicated: Secondary | ICD-10-CM | POA: Diagnosis not present

## 2013-06-28 DIAGNOSIS — Z01419 Encounter for gynecological examination (general) (routine) without abnormal findings: Secondary | ICD-10-CM | POA: Diagnosis not present

## 2013-07-02 DIAGNOSIS — M5430 Sciatica, unspecified side: Secondary | ICD-10-CM | POA: Diagnosis not present

## 2013-07-02 DIAGNOSIS — M5126 Other intervertebral disc displacement, lumbar region: Secondary | ICD-10-CM | POA: Diagnosis not present

## 2013-07-02 DIAGNOSIS — M999 Biomechanical lesion, unspecified: Secondary | ICD-10-CM | POA: Diagnosis not present

## 2013-07-15 DIAGNOSIS — Z1231 Encounter for screening mammogram for malignant neoplasm of breast: Secondary | ICD-10-CM | POA: Diagnosis not present

## 2013-07-19 DIAGNOSIS — D239 Other benign neoplasm of skin, unspecified: Secondary | ICD-10-CM | POA: Diagnosis not present

## 2013-07-19 DIAGNOSIS — D485 Neoplasm of uncertain behavior of skin: Secondary | ICD-10-CM | POA: Diagnosis not present

## 2013-07-19 DIAGNOSIS — D492 Neoplasm of unspecified behavior of bone, soft tissue, and skin: Secondary | ICD-10-CM | POA: Diagnosis not present

## 2013-07-19 DIAGNOSIS — L820 Inflamed seborrheic keratosis: Secondary | ICD-10-CM | POA: Diagnosis not present

## 2013-07-22 DIAGNOSIS — R222 Localized swelling, mass and lump, trunk: Secondary | ICD-10-CM | POA: Diagnosis not present

## 2013-07-23 DIAGNOSIS — E279 Disorder of adrenal gland, unspecified: Secondary | ICD-10-CM | POA: Diagnosis not present

## 2013-07-23 DIAGNOSIS — Z8541 Personal history of malignant neoplasm of cervix uteri: Secondary | ICD-10-CM | POA: Diagnosis not present

## 2013-08-12 ENCOUNTER — Encounter: Payer: Self-pay | Admitting: *Deleted

## 2013-08-12 DIAGNOSIS — E039 Hypothyroidism, unspecified: Secondary | ICD-10-CM | POA: Diagnosis not present

## 2013-08-12 DIAGNOSIS — C7490 Malignant neoplasm of unspecified part of unspecified adrenal gland: Secondary | ICD-10-CM | POA: Diagnosis not present

## 2013-08-16 DIAGNOSIS — Z01818 Encounter for other preprocedural examination: Secondary | ICD-10-CM | POA: Diagnosis not present

## 2013-08-16 DIAGNOSIS — Z1289 Encounter for screening for malignant neoplasm of other sites: Secondary | ICD-10-CM | POA: Diagnosis not present

## 2013-08-16 DIAGNOSIS — K2100 Gastro-esophageal reflux disease with esophagitis, without bleeding: Secondary | ICD-10-CM | POA: Diagnosis not present

## 2013-08-29 DIAGNOSIS — D485 Neoplasm of uncertain behavior of skin: Secondary | ICD-10-CM | POA: Diagnosis not present

## 2013-08-29 DIAGNOSIS — L820 Inflamed seborrheic keratosis: Secondary | ICD-10-CM | POA: Diagnosis not present

## 2013-08-29 DIAGNOSIS — L723 Sebaceous cyst: Secondary | ICD-10-CM | POA: Diagnosis not present

## 2013-11-16 DIAGNOSIS — J111 Influenza due to unidentified influenza virus with other respiratory manifestations: Secondary | ICD-10-CM | POA: Diagnosis not present

## 2013-12-09 DIAGNOSIS — S93409A Sprain of unspecified ligament of unspecified ankle, initial encounter: Secondary | ICD-10-CM | POA: Diagnosis not present

## 2013-12-18 DIAGNOSIS — R12 Heartburn: Secondary | ICD-10-CM | POA: Diagnosis not present

## 2013-12-18 DIAGNOSIS — K219 Gastro-esophageal reflux disease without esophagitis: Secondary | ICD-10-CM | POA: Diagnosis not present

## 2013-12-18 DIAGNOSIS — K6289 Other specified diseases of anus and rectum: Secondary | ICD-10-CM | POA: Diagnosis not present

## 2013-12-18 DIAGNOSIS — K319 Disease of stomach and duodenum, unspecified: Secondary | ICD-10-CM | POA: Diagnosis not present

## 2013-12-18 DIAGNOSIS — Z5181 Encounter for therapeutic drug level monitoring: Secondary | ICD-10-CM | POA: Diagnosis not present

## 2013-12-18 DIAGNOSIS — Z1211 Encounter for screening for malignant neoplasm of colon: Secondary | ICD-10-CM | POA: Diagnosis not present

## 2013-12-18 DIAGNOSIS — K2100 Gastro-esophageal reflux disease with esophagitis, without bleeding: Secondary | ICD-10-CM | POA: Diagnosis not present

## 2013-12-18 LAB — HM COLONOSCOPY: HM COLON: NORMAL

## 2013-12-19 DIAGNOSIS — H4011X0 Primary open-angle glaucoma, stage unspecified: Secondary | ICD-10-CM | POA: Diagnosis not present

## 2013-12-20 DIAGNOSIS — K2100 Gastro-esophageal reflux disease with esophagitis, without bleeding: Secondary | ICD-10-CM | POA: Diagnosis not present

## 2013-12-25 DIAGNOSIS — J45909 Unspecified asthma, uncomplicated: Secondary | ICD-10-CM | POA: Diagnosis not present

## 2013-12-25 DIAGNOSIS — E785 Hyperlipidemia, unspecified: Secondary | ICD-10-CM | POA: Diagnosis not present

## 2013-12-25 DIAGNOSIS — J3089 Other allergic rhinitis: Secondary | ICD-10-CM | POA: Diagnosis not present

## 2013-12-30 DIAGNOSIS — M5126 Other intervertebral disc displacement, lumbar region: Secondary | ICD-10-CM | POA: Diagnosis not present

## 2013-12-30 DIAGNOSIS — M999 Biomechanical lesion, unspecified: Secondary | ICD-10-CM | POA: Diagnosis not present

## 2013-12-30 DIAGNOSIS — M5430 Sciatica, unspecified side: Secondary | ICD-10-CM | POA: Diagnosis not present

## 2014-01-13 DIAGNOSIS — M5430 Sciatica, unspecified side: Secondary | ICD-10-CM | POA: Diagnosis not present

## 2014-01-13 DIAGNOSIS — M5126 Other intervertebral disc displacement, lumbar region: Secondary | ICD-10-CM | POA: Diagnosis not present

## 2014-01-13 DIAGNOSIS — M999 Biomechanical lesion, unspecified: Secondary | ICD-10-CM | POA: Diagnosis not present

## 2014-01-23 DIAGNOSIS — H409 Unspecified glaucoma: Secondary | ICD-10-CM | POA: Diagnosis not present

## 2014-01-23 DIAGNOSIS — J45909 Unspecified asthma, uncomplicated: Secondary | ICD-10-CM | POA: Diagnosis not present

## 2014-01-23 DIAGNOSIS — J309 Allergic rhinitis, unspecified: Secondary | ICD-10-CM | POA: Diagnosis not present

## 2014-03-19 DIAGNOSIS — M5020 Other cervical disc displacement, unspecified cervical region: Secondary | ICD-10-CM | POA: Diagnosis not present

## 2014-03-19 DIAGNOSIS — M5412 Radiculopathy, cervical region: Secondary | ICD-10-CM | POA: Diagnosis not present

## 2014-03-19 DIAGNOSIS — M9981 Other biomechanical lesions of cervical region: Secondary | ICD-10-CM | POA: Diagnosis not present

## 2014-03-24 DIAGNOSIS — M5020 Other cervical disc displacement, unspecified cervical region: Secondary | ICD-10-CM | POA: Diagnosis not present

## 2014-03-24 DIAGNOSIS — M9981 Other biomechanical lesions of cervical region: Secondary | ICD-10-CM | POA: Diagnosis not present

## 2014-03-24 DIAGNOSIS — M5412 Radiculopathy, cervical region: Secondary | ICD-10-CM | POA: Diagnosis not present

## 2014-03-28 DIAGNOSIS — M5412 Radiculopathy, cervical region: Secondary | ICD-10-CM | POA: Diagnosis not present

## 2014-03-28 DIAGNOSIS — M5020 Other cervical disc displacement, unspecified cervical region: Secondary | ICD-10-CM | POA: Diagnosis not present

## 2014-03-28 DIAGNOSIS — M9981 Other biomechanical lesions of cervical region: Secondary | ICD-10-CM | POA: Diagnosis not present

## 2014-04-04 DIAGNOSIS — M5412 Radiculopathy, cervical region: Secondary | ICD-10-CM | POA: Diagnosis not present

## 2014-04-04 DIAGNOSIS — M5020 Other cervical disc displacement, unspecified cervical region: Secondary | ICD-10-CM | POA: Diagnosis not present

## 2014-04-04 DIAGNOSIS — M9981 Other biomechanical lesions of cervical region: Secondary | ICD-10-CM | POA: Diagnosis not present

## 2014-04-14 DIAGNOSIS — H1710 Central corneal opacity, unspecified eye: Secondary | ICD-10-CM | POA: Diagnosis not present

## 2014-04-29 DIAGNOSIS — M999 Biomechanical lesion, unspecified: Secondary | ICD-10-CM | POA: Diagnosis not present

## 2014-04-29 DIAGNOSIS — M5124 Other intervertebral disc displacement, thoracic region: Secondary | ICD-10-CM | POA: Diagnosis not present

## 2014-04-29 DIAGNOSIS — M5414 Radiculopathy, thoracic region: Secondary | ICD-10-CM | POA: Diagnosis not present

## 2014-05-27 DIAGNOSIS — H52219 Irregular astigmatism, unspecified eye: Secondary | ICD-10-CM | POA: Diagnosis not present

## 2014-05-27 DIAGNOSIS — H409 Unspecified glaucoma: Secondary | ICD-10-CM | POA: Diagnosis not present

## 2014-05-27 DIAGNOSIS — H2510 Age-related nuclear cataract, unspecified eye: Secondary | ICD-10-CM | POA: Diagnosis not present

## 2014-05-27 DIAGNOSIS — H4011X0 Primary open-angle glaucoma, stage unspecified: Secondary | ICD-10-CM | POA: Diagnosis not present

## 2014-05-27 DIAGNOSIS — H18719 Corneal ectasia, unspecified eye: Secondary | ICD-10-CM | POA: Diagnosis not present

## 2014-07-02 ENCOUNTER — Ambulatory Visit (INDEPENDENT_AMBULATORY_CARE_PROVIDER_SITE_OTHER): Payer: Medicare Other | Admitting: Family

## 2014-07-02 ENCOUNTER — Encounter: Payer: Self-pay | Admitting: Family

## 2014-07-02 VITALS — BP 98/60 | HR 86 | Ht 63.0 in | Wt 121.6 lb

## 2014-07-02 DIAGNOSIS — F3289 Other specified depressive episodes: Secondary | ICD-10-CM | POA: Diagnosis not present

## 2014-07-02 DIAGNOSIS — E785 Hyperlipidemia, unspecified: Secondary | ICD-10-CM

## 2014-07-02 DIAGNOSIS — J45909 Unspecified asthma, uncomplicated: Secondary | ICD-10-CM | POA: Diagnosis not present

## 2014-07-02 DIAGNOSIS — M8580 Other specified disorders of bone density and structure, unspecified site: Secondary | ICD-10-CM

## 2014-07-02 DIAGNOSIS — M899 Disorder of bone, unspecified: Secondary | ICD-10-CM

## 2014-07-02 DIAGNOSIS — J309 Allergic rhinitis, unspecified: Secondary | ICD-10-CM

## 2014-07-02 DIAGNOSIS — M949 Disorder of cartilage, unspecified: Secondary | ICD-10-CM

## 2014-07-02 DIAGNOSIS — L570 Actinic keratosis: Secondary | ICD-10-CM | POA: Diagnosis not present

## 2014-07-02 DIAGNOSIS — F329 Major depressive disorder, single episode, unspecified: Secondary | ICD-10-CM | POA: Insufficient documentation

## 2014-07-02 DIAGNOSIS — Z8542 Personal history of malignant neoplasm of other parts of uterus: Secondary | ICD-10-CM

## 2014-07-02 NOTE — Progress Notes (Signed)
Pre visit review using our clinic review tool, if applicable. No additional management support is needed unless otherwise documented below in the visit note. 

## 2014-07-02 NOTE — Progress Notes (Signed)
Subjective:    Patient ID: Kelly Petersen, female    DOB: 1946-05-29, 68 y.o.   MRN: [id number]  HPI  68 year old white female, nonsmoker with a history of asthma, allergic rhinitis, depression, and hypercholesterolemia is in today with complaints of a lesion behind her right knee has been present for an unknown period of time. Denies any known history of any skin cancers. Typically has a dermatology exam annually. Denies any bleeding, groin or irregularity as far she knows. Also has a second lesion to her right lower back that is rough and dry. Is unsure of how long the lesions in it.   Review of Systems  Constitutional: Negative.   Respiratory: Negative.   Cardiovascular: Negative.   Skin:       Lesion right back of leg and right lower back  Psychiatric/Behavioral: Negative.    Past Medical History  Diagnosis Date  . Endometrial cancer   . Chicken pox   . Depression   . Hyperlipidemia   . Glaucoma     History   Social History  . Marital Status: Single    Spouse Name: N/A    Number of Children: N/A  . Years of Education: N/A   Occupational History  . Not on file.   Social History Main Topics  . Smoking status: Never Smoker   . Smokeless tobacco: Not on file  . Alcohol Use: Yes  . Drug Use: No  . Sexual Activity: Not on file   Other Topics Concern  . Not on file   Social History Narrative  . No narrative on file    Past Surgical History  Procedure Laterality Date  . Breast surgery    . 2004    . Abdominal hysterectomy      Family History  Problem Relation Age of Onset  . Lung cancer    . Hyperlipidemia      No Known Allergies  Current Outpatient Prescriptions on File Prior to Visit  Medication Sig Dispense Refill  . Brimonidine Tartrate-Timolol (COMBIGAN OP) Apply 1 drop to eye 2 (two) times daily.      . mometasone (ASMANEX) 220 MCG/INH inhaler Inhale 4 puffs into the lungs daily.      . montelukast (SINGULAIR) 10 MG tablet Take 10 mg by mouth  at bedtime.      . raloxifene (EVISTA) 60 MG tablet Take 60 mg by mouth daily.      . rosuvastatin (CRESTOR) 5 MG tablet Take 5 mg by mouth daily.      . traZODone (DESYREL) 50 MG tablet Take 50 mg by mouth at bedtime.       No current facility-administered medications on file prior to visit.    BP 98/60  Pulse 86  Ht 5\' 3"  (1.6 m)  Wt 121 lb 9.6 oz (55.157 kg)  BMI 21.55 kg/m2chart     Objective:   Physical Exam  Constitutional: She is oriented to person, place, and time. She appears well-developed and well-nourished.  Cardiovascular: Normal rate, regular rhythm and normal heart sounds.   Pulmonary/Chest: Effort normal and breath sounds normal.  Neurological: She is alert and oriented to person, place, and time.  Skin:     Psychiatric: She has a normal mood and affect.     Right leg and lower back: Informed consent was obtained and the site was treated with 20 % acetic acid. The color of the lesion changed to white with the application of acid. The patient tolerated the procedure well and  aftercare instructions were given to the patient.     Assessment & Plan:  Kelly Petersen was seen today for no specified reason.  Diagnoses and associated orders for this visit:  Actinic keratoses  Unspecified asthma(493.90)  Other and unspecified hyperlipidemia  Depressive disorder, not elsewhere classified  Osteopenia  Allergic rhinitis, unspecified allergic rhinitis type  History of uterine cancer   Return for CPX asap. See dermatology as scheduled.

## 2014-07-02 NOTE — Patient Instructions (Signed)
Actinic Keratosis Actinic keratosis is a precancerous growth on the skin. This means it could develop into skin cancer if it is not treated. About 1% of actinic keratoses turn into skin cancer within a year. It is important to have all such growths removed to prevent them from developing into skin cancer. CAUSES  Actinic keratosis is caused by getting too much ultraviolet (UV) radiation from the sun or other UV light sources. RISK FACTORS Factors that increase your chances of getting actinic keratosis include:  Having light-colored skin and blue eyes.  Having blonde or red hair.  Spending a lot of time in the sun.  Age. The risk of actinic keratosis increases with age. SYMPTOMS  Actinic keratosis growths look like scaly, rough spots of skin. They can be as small as a pinhead or as big as a quarter. They may itch, hurt, or feel sensitive. Sometimes there is a little tag of pink or gray skin growing off them. In some cases, actinic keratoses are easier felt than seen. They do not go away with the use of moisturizing lotions or creams. Actinic keratoses appear most often on areas of skin that get a lot of sun exposure. These areas include the:  Scalp.  Face.  Ears.  Lips.  Upper back.  Backs of the hands.  Forearms. DIAGNOSIS  Your health care provider can usually tell what is wrong by performing a physical exam. A tissue sample (biopsy) may also be taken and examined under a microscope. TREATMENT  Actinic keratosis can be treated several ways. Most treatments can be done in your health care provider's office. Treatment options may include:  Curettage. A tool is used to gently scrape off the growth.  Cryosurgery. Liquid nitrogen is applied to the growth to freeze it. The growth eventually falls off the skin.  Medicated creams, such as 5-fluorouracil or imiquimod. The medicine destroys the cells in the growth.  Chemical peels. Chemicals are applied to the growth and the outer  layers of skin are peeled off.  Photodynamic therapy. A drug that makes your skin more sensitive to light is applied to the skin. A strong, blue light is aimed at the skin and destroys the growth. PREVENTION  To prevent future sun damage:  Try to avoid the sun between 10:00 a.m. and 4:00 p.m. when it is the strongest.  Use a sunscreen or sunblock with SPF 30 or greater.  Apply sunscreen at least 30 minutes before exposure to the sun.  Always wear protective hats, clothing, and sunglasses with UV protection.  Avoid medicines, herbs, and foods that increase your sensitivity to sunlight.  Avoid tanning beds. HOME CARE INSTRUCTIONS   If your skin was covered with a bandage, change and remove the bandage as directed by your health care provider.  Keep the treated area dry as directed by your health care provider.  Apply any creams as prescribed by your health care provider. Follow the directions carefully.  Check your skin regularly for any changes.  Visit a skin doctor (dermatologist) every year for a skin exam. SEEK MEDICAL CARE IF:   Your skin does not heal and becomes irritated, red, or bleeds.  You notice any changes or new growths on your skin. Document Released: 01/20/2009 Document Revised: 03/10/2014 Document Reviewed: 12/05/2011 [employer] Patient Information 2015 [employer]. This information is not intended to replace advice given to you by your health care provider. Make sure you discuss any questions you have with your health care provider.  

## 2014-07-21 DIAGNOSIS — N6019 Diffuse cystic mastopathy of unspecified breast: Secondary | ICD-10-CM | POA: Diagnosis not present

## 2014-07-21 DIAGNOSIS — M899 Disorder of bone, unspecified: Secondary | ICD-10-CM | POA: Diagnosis not present

## 2014-07-21 DIAGNOSIS — M949 Disorder of cartilage, unspecified: Secondary | ICD-10-CM | POA: Diagnosis not present

## 2014-07-23 ENCOUNTER — Other Ambulatory Visit: Payer: Self-pay | Admitting: Obstetrics & Gynecology

## 2014-07-23 DIAGNOSIS — M949 Disorder of cartilage, unspecified: Principal | ICD-10-CM

## 2014-07-23 DIAGNOSIS — N6012 Diffuse cystic mastopathy of left breast: Secondary | ICD-10-CM

## 2014-07-23 DIAGNOSIS — M899 Disorder of bone, unspecified: Secondary | ICD-10-CM

## 2014-08-04 ENCOUNTER — Other Ambulatory Visit: Payer: Medicare Other

## 2014-08-08 ENCOUNTER — Encounter: Payer: Self-pay | Admitting: Family

## 2014-08-08 ENCOUNTER — Ambulatory Visit (INDEPENDENT_AMBULATORY_CARE_PROVIDER_SITE_OTHER): Payer: Medicare Other | Admitting: Family

## 2014-08-08 VITALS — BP 124/70 | Temp 98.3°F | Ht 61.5 in | Wt 121.0 lb

## 2014-08-08 DIAGNOSIS — Z0000 Encounter for general adult medical examination without abnormal findings: Secondary | ICD-10-CM

## 2014-08-08 DIAGNOSIS — E7800 Pure hypercholesterolemia, unspecified: Secondary | ICD-10-CM

## 2014-08-08 DIAGNOSIS — F329 Major depressive disorder, single episode, unspecified: Secondary | ICD-10-CM

## 2014-08-08 DIAGNOSIS — J4540 Moderate persistent asthma, uncomplicated: Secondary | ICD-10-CM

## 2014-08-08 DIAGNOSIS — Z23 Encounter for immunization: Secondary | ICD-10-CM

## 2014-08-08 DIAGNOSIS — F32A Depression, unspecified: Secondary | ICD-10-CM

## 2014-08-08 DIAGNOSIS — G4700 Insomnia, unspecified: Secondary | ICD-10-CM

## 2014-08-08 LAB — HEPATIC FUNCTION PANEL
ALBUMIN: 3.9 g/dL (ref 3.5–5.2)
ALT: 15 U/L (ref 0–35)
AST: 23 U/L (ref 0–37)
Alkaline Phosphatase: 66 U/L (ref 39–117)
Bilirubin, Direct: 0 mg/dL (ref 0.0–0.3)
TOTAL PROTEIN: 7.6 g/dL (ref 6.0–8.3)
Total Bilirubin: 0.5 mg/dL (ref 0.2–1.2)

## 2014-08-08 LAB — LIPID PANEL
CHOL/HDL RATIO: 3
Cholesterol: 186 mg/dL (ref 0–200)
HDL: 53.6 mg/dL (ref >39.00)
LDL Cholesterol: 98 mg/dL (ref 0–99)
NonHDL: 132.4
Triglycerides: 171 mg/dL — ABNORMAL HIGH (ref 0.0–149.0)
VLDL: 34.2 mg/dL (ref 0.0–40.0)

## 2014-08-08 LAB — CBC WITH DIFFERENTIAL/PLATELET
BASOS PCT: 0.9 % (ref 0.0–3.0)
Basophils Absolute: 0.1 10*3/uL (ref 0.0–0.1)
EOS PCT: 6.4 % — AB (ref 0.0–5.0)
Eosinophils Absolute: 0.5 10*3/uL (ref 0.0–0.7)
HCT: 40.5 % (ref 36.0–46.0)
Hemoglobin: 13.5 g/dL (ref 12.0–15.0)
Lymphocytes Relative: 24.5 % (ref 12.0–46.0)
Lymphs Abs: 2 10*3/uL (ref 0.7–4.0)
MCHC: 33.3 g/dL (ref 30.0–36.0)
MCV: 94.1 fl (ref 78.0–100.0)
Monocytes Absolute: 0.6 10*3/uL (ref 0.1–1.0)
Monocytes Relative: 7.1 % (ref 3.0–12.0)
NEUTROS PCT: 61.1 % (ref 43.0–77.0)
Neutro Abs: 4.9 10*3/uL (ref 1.4–7.7)
Platelets: 256 10*3/uL (ref 150.0–400.0)
RBC: 4.3 Mil/uL (ref 3.87–5.11)
RDW: 13.2 % (ref 11.5–15.5)
WBC: 8.1 10*3/uL (ref 4.0–10.5)

## 2014-08-08 LAB — BASIC METABOLIC PANEL
BUN: 20 mg/dL (ref 6–23)
CHLORIDE: 107 meq/L (ref 96–112)
CO2: 26 meq/L (ref 19–32)
Calcium: 9.7 mg/dL (ref 8.4–10.5)
Creatinine, Ser: 0.9 mg/dL (ref 0.4–1.2)
GFR: 63.3 mL/min (ref >60.00)
GLUCOSE: 93 mg/dL (ref 70–99)
Potassium: 4.9 mEq/L (ref 3.5–5.1)
Sodium: 141 mEq/L (ref 135–145)

## 2014-08-08 LAB — POCT URINALYSIS DIPSTICK
Bilirubin, UA: NEGATIVE
Blood, UA: NEGATIVE
GLUCOSE UA: NEGATIVE
Ketones, UA: NEGATIVE
NITRITE UA: NEGATIVE
Protein, UA: NEGATIVE
Spec Grav, UA: 1.015
Urobilinogen, UA: 0.2
pH, UA: 5

## 2014-08-08 LAB — TSH: TSH: 2.18 u[IU]/mL (ref 0.35–4.50)

## 2014-08-08 MED ORDER — SERTRALINE HCL 50 MG PO TABS: 50.0000 mg | ORAL_TABLET | Freq: Every day | ORAL | Status: DC

## 2014-08-08 MED ORDER — ROSUVASTATIN CALCIUM 5 MG PO TABS: 5.0000 mg | ORAL_TABLET | Freq: Every day | ORAL | Status: DC

## 2014-08-08 NOTE — Addendum Note (Signed)
Addended by: Westley Hummer B on: 08/08/2014 10:47 AM   Modules accepted: Orders

## 2014-08-08 NOTE — Progress Notes (Signed)
Subjective:    Patient ID: Kelly Petersen, female    DOB: 12/25/1945, 68 y.o.   MRN: [id number]  HPI 68 year old white female, nonsmoker with a history of depression, allergic rhinitis, osteopenia, asthma, and hyperlipidemia since for complete physical exam. She sees gynecology for female care. She has had a complete hysterectomy due to uterine cancer. Has a mammogram scheduled for next week.   Review of Systems  Constitutional: Negative.   Eyes: Negative.   Respiratory: Negative.   Cardiovascular: Negative.   Gastrointestinal: Negative.   Endocrine: Negative.   Genitourinary: Negative.   Musculoskeletal: Negative.   Skin: Negative.   Allergic/Immunologic: Negative.   Hematological: Negative.   Psychiatric/Behavioral: Negative.    Past Medical History  Diagnosis Date  . Endometrial cancer   . Chicken pox   . Depression   . Hyperlipidemia   . Glaucoma     History   Social History  . Marital Status: Single    Spouse Name: N/A    Number of Children: N/A  . Years of Education: N/A   Occupational History  . Not on file.   Social History Main Topics  . Smoking status: Never Smoker   . Smokeless tobacco: Not on file  . Alcohol Use: Yes  . Drug Use: No  . Sexual Activity: Not on file   Other Topics Concern  . Not on file   Social History Narrative  . No narrative on file    Past Surgical History  Procedure Laterality Date  . Breast surgery    . 2004    . Abdominal hysterectomy      Family History  Problem Relation Age of Onset  . Lung cancer    . Hyperlipidemia      Allergies  Allergen Reactions  . Other Itching    Azogantrazine - Sulfa drug for UTI    Current Outpatient Prescriptions on File Prior to Visit  Medication Sig Dispense Refill  . Brimonidine Tartrate-Timolol (COMBIGAN OP) Apply 1 drop to eye 2 (two) times daily.      . mometasone (ASMANEX) 220 MCG/INH inhaler Inhale 4 puffs into the lungs daily.      . montelukast (SINGULAIR) 10 MG  tablet Take 10 mg by mouth at bedtime.      . raloxifene (EVISTA) 60 MG tablet Take 60 mg by mouth daily.      . traZODone (DESYREL) 50 MG tablet Take 50 mg by mouth at bedtime.       No current facility-administered medications on file prior to visit.    BP 124/70  Temp(Src) 98.3 F (36.8 C)  Ht 5' 1.5" (1.562 m)  Wt 121 lb (54.885 kg)  BMI 22.50 kg/m2chart    Objective:   Physical Exam  Constitutional: She is oriented to person, place, and time. She appears well-developed and well-nourished.  HENT:  Head: Normocephalic and atraumatic.  Right Ear: External ear normal.  Left Ear: External ear normal.  Nose: Nose normal.  Mouth/Throat: Oropharynx is clear and moist.  Eyes: Conjunctivae and EOM are normal. Pupils are equal, round, and reactive to light.  Neck: Normal range of motion. Neck supple. No thyromegaly present.  Cardiovascular: Normal rate, regular rhythm, normal heart sounds and intact distal pulses.   Pulmonary/Chest: Effort normal and breath sounds normal.  Abdominal: Soft. Bowel sounds are normal.  Musculoskeletal: Normal range of motion.  Neurological: She is alert and oriented to person, place, and time. She has normal reflexes. She displays normal reflexes. No cranial nerve deficit.  Coordination normal.  Skin: Skin is warm and dry.  Psychiatric: She has a normal mood and affect.          Assessment & Plan:  Roseana was seen today for annual exam.  Diagnoses and associated orders for this visit:  Medicare annual wellness visit, subsequent - Basic Metabolic Panel - Hepatic Function Panel - CBC with Differential - EKG 12-Lead - Lipid panel - TSH - POC Urinalysis Dipstick  Depression - Basic Metabolic Panel - Hepatic Function Panel - CBC with Differential - EKG 12-Lead - Lipid panel - TSH - POC Urinalysis Dipstick  Asthma, chronic, moderate persistent, uncomplicated - Basic Metabolic Panel - Hepatic Function Panel - CBC with Differential - EKG  12-Lead - Lipid panel - TSH - POC Urinalysis Dipstick  Pure hypercholesterolemia - Basic Metabolic Panel - Hepatic Function Panel - CBC with Differential - EKG 12-Lead - Lipid panel - TSH - POC Urinalysis Dipstick  Insomnia - Basic Metabolic Panel - Hepatic Function Panel - CBC with Differential - EKG 12-Lead - Lipid panel - TSH - POC Urinalysis Dipstick  Other Orders - rosuvastatin (CRESTOR) 5 MG tablet; Take 1 tablet (5 mg total) by mouth daily. - sertraline (ZOLOFT) 50 MG tablet; Take 1 tablet (50 mg total) by mouth daily.     Call the office with any questions or concerns. Recheck in 6 months or sooner as needed.

## 2014-08-08 NOTE — Patient Instructions (Signed)
Exercise to Stay Healthy Exercise helps you become and stay healthy. EXERCISE IDEAS AND TIPS Choose exercises that:  You enjoy.  Fit into your day. You do not need to exercise really hard to be healthy. You can do exercises at a slow or medium level and stay healthy. You can:  Stretch before and after working out.  Try yoga, Pilates, or tai chi.  Lift weights.  Walk fast, swim, jog, run, climb stairs, bicycle, dance, or rollerskate.  Take aerobic classes. Exercises that burn about 150 calories:  Running 1  miles in 15 minutes.  Playing volleyball for 45 to 60 minutes.  Washing and waxing a car for 45 to 60 minutes.  Playing touch football for 45 minutes.  Walking 1  miles in 35 minutes.  Pushing a stroller 1  miles in 30 minutes.  Playing basketball for 30 minutes.  Raking leaves for 30 minutes.  Bicycling 5 miles in 30 minutes.  Walking 2 miles in 30 minutes.  Dancing for 30 minutes.  Shoveling snow for 15 minutes.  Swimming laps for 20 minutes.  Walking up stairs for 15 minutes.  Bicycling 4 miles in 15 minutes.  Gardening for 30 to 45 minutes.  Jumping rope for 15 minutes.  Washing windows or floors for 45 to 60 minutes. Document Released: 11/26/2010 Document Revised: 01/16/2012 Document Reviewed: 11/26/2010 [facility] Patient Information 2015 [place], [place]. This information is not intended to replace advice given to you by your health care provider. Make sure you discuss any questions you have with your health care provider.

## 2014-08-08 NOTE — Progress Notes (Signed)
Pre visit review using our clinic review tool, if applicable. No additional management support is needed unless otherwise documented below in the visit note. 

## 2014-08-20 ENCOUNTER — Other Ambulatory Visit: Payer: Medicare Other

## 2014-09-03 DIAGNOSIS — L720 Epidermal cyst: Secondary | ICD-10-CM | POA: Diagnosis not present

## 2014-09-03 DIAGNOSIS — L700 Acne vulgaris: Secondary | ICD-10-CM | POA: Diagnosis not present

## 2014-09-03 DIAGNOSIS — L821 Other seborrheic keratosis: Secondary | ICD-10-CM | POA: Diagnosis not present

## 2014-09-03 DIAGNOSIS — L814 Other melanin hyperpigmentation: Secondary | ICD-10-CM | POA: Diagnosis not present

## 2014-09-03 DIAGNOSIS — L738 Other specified follicular disorders: Secondary | ICD-10-CM | POA: Diagnosis not present

## 2014-09-03 DIAGNOSIS — L218 Other seborrheic dermatitis: Secondary | ICD-10-CM | POA: Diagnosis not present

## 2014-09-10 ENCOUNTER — Ambulatory Visit: Payer: Medicare Other

## 2014-09-10 ENCOUNTER — Other Ambulatory Visit: Payer: Self-pay | Admitting: Obstetrics & Gynecology

## 2014-09-10 ENCOUNTER — Encounter (INDEPENDENT_AMBULATORY_CARE_PROVIDER_SITE_OTHER): Payer: Self-pay

## 2014-09-10 ENCOUNTER — Ambulatory Visit
Admission: RE | Admit: 2014-09-10 | Discharge: 2014-09-10 | Disposition: A | Discharge status: Home / Self Care | Payer: Medicare Other | Source: Ambulatory Visit | Attending: Obstetrics & Gynecology | Admitting: Obstetrics & Gynecology

## 2014-09-10 ENCOUNTER — Ambulatory Visit: Admission: RE | Admit: 2014-09-10 | Payer: Medicare Other | Source: Ambulatory Visit

## 2014-09-10 DIAGNOSIS — Z1231 Encounter for screening mammogram for malignant neoplasm of breast: Secondary | ICD-10-CM

## 2014-11-13 ENCOUNTER — Ambulatory Visit
Admission: RE | Admit: 2014-11-13 | Discharge: 2014-11-13 | Disposition: A | Discharge status: Home / Self Care | Payer: Medicare Other | Source: Ambulatory Visit | Attending: Obstetrics & Gynecology | Admitting: Obstetrics & Gynecology

## 2014-11-13 DIAGNOSIS — M85852 Other specified disorders of bone density and structure, left thigh: Secondary | ICD-10-CM | POA: Diagnosis not present

## 2014-11-13 DIAGNOSIS — M949 Disorder of cartilage, unspecified: Principal | ICD-10-CM

## 2014-11-13 DIAGNOSIS — M899 Disorder of bone, unspecified: Secondary | ICD-10-CM

## 2014-11-13 DIAGNOSIS — Z780 Asymptomatic menopausal state: Secondary | ICD-10-CM | POA: Diagnosis not present

## 2014-11-13 LAB — HM DEXA SCAN

## 2014-12-12 ENCOUNTER — Encounter: Payer: Self-pay | Admitting: Family

## 2014-12-12 ENCOUNTER — Ambulatory Visit (INDEPENDENT_AMBULATORY_CARE_PROVIDER_SITE_OTHER): Payer: Medicare Other | Admitting: Family

## 2014-12-12 VITALS — BP 100/64 | Temp 97.9°F | Ht 61.5 in | Wt 122.0 lb

## 2014-12-12 DIAGNOSIS — L570 Actinic keratosis: Secondary | ICD-10-CM

## 2014-12-12 NOTE — Patient Instructions (Signed)
Actinic Keratosis Actinic keratosis is a precancerous growth on the skin. This means it could develop into skin cancer if it is not treated. About 1% of actinic keratoses turn into skin cancer within a year. It is important to have all such growths removed to prevent them from developing into skin cancer. CAUSES  Actinic keratosis is caused by getting too much ultraviolet (UV) radiation from the sun or other UV light sources. RISK FACTORS Factors that increase your chances of getting actinic keratosis include:  Having light-colored skin and blue eyes.  Having blonde or red hair.  Spending a lot of time in the sun.  Age. The risk of actinic keratosis increases with age. SYMPTOMS  Actinic keratosis growths look like scaly, rough spots of skin. They can be as small as a pinhead or as big as a quarter. They may itch, hurt, or feel sensitive. Sometimes there is a little tag of pink or gray skin growing off them. In some cases, actinic keratoses are easier felt than seen. They do not go away with the use of moisturizing lotions or creams. Actinic keratoses appear most often on areas of skin that get a lot of sun exposure. These areas include the:  Scalp.  Face.  Ears.  Lips.  Upper back.  Backs of the hands.  Forearms. DIAGNOSIS  Your health care provider can usually tell what is wrong by performing a physical exam. A tissue sample (biopsy) may also be taken and examined under a microscope. TREATMENT  Actinic keratosis can be treated several ways. Most treatments can be done in your health care provider's office. Treatment options may include:  Curettage. A tool is used to gently scrape off the growth.  Cryosurgery. Liquid nitrogen is applied to the growth to freeze it. The growth eventually falls off the skin.  Medicated creams, such as 5-fluorouracil or imiquimod. The medicine destroys the cells in the growth.  Chemical peels. Chemicals are applied to the growth and the outer  layers of skin are peeled off.  Photodynamic therapy. A drug that makes your skin more sensitive to light is applied to the skin. A strong, blue light is aimed at the skin and destroys the growth. PREVENTION  To prevent future sun damage:  Try to avoid the sun between 10:00 a.m. and 4:00 p.m. when it is the strongest.  Use a sunscreen or sunblock with SPF 30 or greater.  Apply sunscreen at least 30 minutes before exposure to the sun.  Always wear protective hats, clothing, and sunglasses with UV protection.  Avoid medicines, herbs, and foods that increase your sensitivity to sunlight.  Avoid tanning beds. HOME CARE INSTRUCTIONS   If your skin was covered with a bandage, change and remove the bandage as directed by your health care provider.  Keep the treated area dry as directed by your health care provider.  Apply any creams as prescribed by your health care provider. Follow the directions carefully.  Check your skin regularly for any changes.  Visit a skin doctor (dermatologist) every year for a skin exam. SEEK MEDICAL CARE IF:   Your skin does not heal and becomes irritated, red, or bleeds.  You notice any changes or new growths on your skin. Document Released: 01/20/2009 Document Revised: 03/10/2014 Document Reviewed: 12/05/2011 [facility] Patient Information 2015 [place], [place]. This information is not intended to replace advice given to you by your health care provider. Make sure you discuss any questions you have with your health care provider.

## 2014-12-12 NOTE — Progress Notes (Signed)
Subjective:    Patient ID: Kelly Petersen, female    DOB: [birth year], 69 y.o.   MRN: [id number]  HPI 69 year old white female, nonsmoker with a history of actinic keratosis is in today requesting to have a lesion frozen on her lower back. Unsure how long the lesion has been present. Describes as itchy. Applying any medication. She's also requesting a referral to dermatology to evaluate a lesion that she had removed from her left ear that was found to be noncancerous. She has been applying Neosporin and cortisone without much relief. The lesion appears to be more red and tender. No pustular discharge. However, she does not want to see [place] dermatology.   Review of Systems  Constitutional: Negative.   Respiratory: Negative.   Cardiovascular: Negative.   Skin:       Skin lesion lower back.   Recurrent lesion left ear.    Past Medical History  Diagnosis Date  . Endometrial cancer   . Chicken pox   . Depression   . Hyperlipidemia   . Glaucoma     History   Social History  . Marital Status: Single    Spouse Name: N/A    Number of Children: N/A  . Years of Education: N/A   Occupational History  . Not on file.   Social History Main Topics  . Smoking status: Never Smoker   . Smokeless tobacco: Not on file  . Alcohol Use: Yes  . Drug Use: No  . Sexual Activity: Not on file   Other Topics Concern  . Not on file   Social History Narrative    Past Surgical History  Procedure Laterality Date  . Breast surgery    . 2004    . Abdominal hysterectomy      Family History  Problem Relation Age of Onset  . Lung cancer    . Hyperlipidemia      Allergies  Allergen Reactions  . Other Itching    Azogantrazine - Sulfa drug for UTI    Current Outpatient Prescriptions on File Prior to Visit  Medication Sig Dispense Refill  . Brimonidine Tartrate-Timolol (COMBIGAN OP) Apply 1 drop to eye 2 (two) times daily.    . mometasone (ASMANEX) 220 MCG/INH inhaler Inhale 4 puffs  into the lungs daily.    . montelukast (SINGULAIR) 10 MG tablet Take 10 mg by mouth at bedtime.    . raloxifene (EVISTA) 60 MG tablet Take 60 mg by mouth daily.    . rosuvastatin (CRESTOR) 5 MG tablet Take 1 tablet (5 mg total) by mouth daily. 90 tablet 1  . sertraline (ZOLOFT) 50 MG tablet Take 1 tablet (50 mg total) by mouth daily. 90 tablet 1  . traZODone (DESYREL) 50 MG tablet Take 50 mg by mouth at bedtime.     No current facility-administered medications on file prior to visit.    BP 100/64 mmHg  Temp(Src) 97.9 F (36.6 C) (Oral)  Ht 5' 1.5" (1.562 m)  Wt 122 lb (55.339 kg)  BMI 22.68 kg/m2chart    Objective:   Physical Exam  Constitutional: She appears well-developed and well-nourished.  HENT:  Nose: Nose normal.  Mouth/Throat: Oropharynx is clear and moist.  0.2 mm lesion noted to the auricle of the left ear. No drainage or discharge. Tender to palpation. Mildly edematous.  Neck: Normal range of motion. Neck supple.  Cardiovascular: Normal rate, regular rhythm and normal heart sounds.   Pulmonary/Chest: Effort normal and breath sounds normal.  Skin:  Lower back: Informed consent was obtained and the site was treated with 20 % acetic acid. The color of the lesion changed to white with the application of acid. The patient tolerated the procedure well and  aftercare instructions were given to the patient.      Assessment & Plan:  Kelly Petersen was seen today for spot on back.  Diagnoses and associated orders for this visit:  Actinic keratoses - Ambulatory referral to Dermatology    no Refer to dermatology. Cryotherapy  instructions in care provided.

## 2014-12-12 NOTE — Progress Notes (Signed)
Pre visit review using our clinic review tool, if applicable. No additional management support is needed unless otherwise documented below in the visit note. 

## 2014-12-19 DIAGNOSIS — J45901 Unspecified asthma with (acute) exacerbation: Secondary | ICD-10-CM | POA: Diagnosis not present

## 2014-12-19 DIAGNOSIS — J40 Bronchitis, not specified as acute or chronic: Secondary | ICD-10-CM | POA: Diagnosis not present

## 2015-02-02 ENCOUNTER — Other Ambulatory Visit: Payer: Self-pay | Admitting: Family

## 2015-02-05 DIAGNOSIS — L720 Epidermal cyst: Secondary | ICD-10-CM | POA: Diagnosis not present

## 2015-02-05 DIAGNOSIS — Z85828 Personal history of other malignant neoplasm of skin: Secondary | ICD-10-CM | POA: Diagnosis not present

## 2015-02-05 DIAGNOSIS — L280 Lichen simplex chronicus: Secondary | ICD-10-CM | POA: Diagnosis not present

## 2015-02-05 DIAGNOSIS — L821 Other seborrheic keratosis: Secondary | ICD-10-CM | POA: Diagnosis not present

## 2015-02-09 ENCOUNTER — Ambulatory Visit: Payer: Medicare Other | Admitting: Family

## 2015-03-20 ENCOUNTER — Ambulatory Visit (INDEPENDENT_AMBULATORY_CARE_PROVIDER_SITE_OTHER): Payer: Medicare Other | Admitting: Adult Health

## 2015-03-20 ENCOUNTER — Encounter: Payer: Self-pay | Admitting: Adult Health

## 2015-03-20 VITALS — Temp 98.5°F | Ht 61.5 in | Wt 120.8 lb

## 2015-03-20 DIAGNOSIS — T148 Other injury of unspecified body region: Secondary | ICD-10-CM | POA: Diagnosis not present

## 2015-03-20 DIAGNOSIS — W57XXXA Bitten or stung by nonvenomous insect and other nonvenomous arthropods, initial encounter: Secondary | ICD-10-CM

## 2015-03-20 MED ORDER — DOXYCYCLINE HYCLATE 100 MG PO CAPS: 100.0000 mg | ORAL_CAPSULE | Freq: Two times a day (BID) | ORAL | Status: DC

## 2015-03-20 NOTE — Patient Instructions (Addendum)
Please take the Doxycycline 100mg  twice a day for 10 days. I have attached information on Lyme Disease and [facility] Spotted fever. Return to the office for any of those signs or symptoms.    [place] Spotted Fever [place] Spotted Fever (RMSF) is the oldest known tick-borne disease of people in the [place]. This disease was named because it was first described among people in the [facility] area who had an illness characterized by a rash with red-purple-black spots. This disease is caused by a rickettsia (Rickettsia rickettsii), a bacteria carried by the tick. The [facility] wood tick and the American dog tick acquire and transmit the RMSF bacteria (pictures NOT actual size). When a larval, nymphal, or adult tick feeds on an infected rodent or larger animal, the tick can become infected. Infected adult ticks then feed on people who may then get RMSF. The tick transmits the disease to humans during a prolonged period of feeding that lasts many hours, days, or even a couple weeks. The bite is painless and frequently goes unnoticed. An infected female tick may also pass the rickettsial bacteria to her eggs that then may mature to be infected adult ticks. The rickettsia that causes RMSF can also get into a person's body through damaged skin. A tick bite is not necessary. People can get RMSF if they crush a tick and get its blood or body fluids on their skin through a small cut or sore.  DIAGNOSIS Diagnosis is made by laboratory tests.  TREATMENT Treatment is with antibiotics (medications that kill rickettsia and other bacteria). Immediate treatment usually prevents death. GEOGRAPHIC RANGE This disease was reported only in the [facility] until 1931. RMSF has more recently been described among individuals in all states except [place], [place], and [place]. The highest reported incidences of RMSF now occur among residents of [place], [place], [place], and the  [place]. TIME OF YEAR  Most cases are diagnosed during late spring and summer when ticks are most active. However, especially in the warmer [place] states, a few cases occur during the winter. SYMPTOMS   Symptoms of RMSF begin from 2 to 14 days after a tick bite. The most common early symptoms are fever, muscle aches, and headache followed by nausea (feeling sick to your stomach) or vomiting.  The RMSF rash is typically delayed until 3 or more days after symptom onset, and eventually develops in 9 of 10 infected patients by the fifth day of illness. If the disease is not treated it can cause death. If you get a fever, headache, muscle aches, rash, nausea, or vomiting within 2 weeks of a possible tick bite or exposure, you should see your caregiver immediately. PREVENTION Ticks prefer to hide in shady, moist ground litter. They can often be found above the ground clinging to tall grass, brush, shrubs and low tree branches. They also inhabit lawns and gardens, especially at the edges of woodlands and around old stone walls. Within the areas where ticks generally live, no naturally vegetated area can be considered completely free of infected ticks. The best precaution against RMSF is to avoid contact with soil, leaf litter, and vegetation as much as possible in tick-infested areas. For those who enjoy gardening or walking in their yards, clear brush and mow tall grass around houses and at the edges of gardens. This may help reduce the tick population in the immediate area. Applications of chemical insecticides by a licensed professional in the spring (late May) and fall (September) will also  control ticks, especially in heavily infested areas. Treatment will never get rid of all the ticks. Getting rid of small animal populations that host ticks will also decrease the tick population. When working in the garden, [employer], or handling soil and vegetation, wear light-colored protective clothing and  gloves. Spot-check often to prevent ticks from reaching the skin. Ticks cannot jump or fly. They will not drop from an above-ground perch onto a passing animal. Once a tick gains access to human skin it climbs upward until it reaches a more protected area. For example, the back of the knee, groin, navel, armpit, ears, or nape of the neck. It then begins the slow process of embedding itself in the skin. Campers, hikers, field workers, and others who spend time in wooded, brushy, or tall grassy areas can avoid exposure to ticks by using the following precautions:  Wear light-colored clothing with a tight weave to spot ticks more easily and prevent contact with the skin.  Wear long pants tucked into socks, long-sleeved shirts tucked into pants and enclosed shoes or boots along with insect repellent.  Spray clothes with insect repellent containing either DEET or Permethrin. Only DEET can be used on exposed skin. Follow the manufacturer's directions carefully.  Wear a hat and keep long hair pulled back.  Stay on cleared, well-worn trails whenever possible.  Spot-check yourself and others often for the presence of ticks on clothes. If you find one, there are likely to be others. Check thoroughly.  Remove clothes after leaving tick-infested areas. If possible, wash them to eliminate any unseen ticks. Check yourself, your children and any pets from head to toe for the presence of ticks.  Shower and shampoo. You can greatly reduce your chances of contracting RMSF if you remove attached ticks as soon as possible. Regular checks of the body, including all body sites covered by hair (head, armpits, genitals), allow removal of the tick before rickettsial transmission. To remove an attached tick, use a forceps or tweezers to detach the intact tick without leaving mouth parts in the skin. The tick bite wound should be cleansed after tick removal. Remember the most common symptoms of RMSF are fever, muscle aches,  headache, and nausea or vomiting with a later onset of rash. If you get these symptoms after a tick bite and while living in an area where RMSF is found, RMSF should be suspected. If the disease is not treated, it can cause death. See your caregiver immediately if you get these symptoms. Do this even if not aware of a tick bite. Document Released: 02/05/2001 Document Revised: 03/10/2014 Document Reviewed: 09/28/2009 [facility] Patient Information 2015 [place], [place]. This information is not intended to replace advice given to you by your health care provider. Make sure you discuss any questions you have with your health care provider. [place] Spotted Fever [place] Spotted Fever (RMSF) is the oldest known tick-borne disease of people in the [place]. This disease was named because it was first described among people in the [facility] area who had an illness characterized by a rash with red-purple-black spots. This disease is caused by a rickettsia (Rickettsia rickettsii), a bacteria carried by the tick. The [facility] wood tick and the American dog tick acquire and transmit the RMSF bacteria (pictures NOT actual size). When a larval, nymphal, or adult tick feeds on an infected rodent or larger animal, the tick can become infected. Infected adult ticks then feed on people who may then get RMSF. The tick  transmits the disease to humans during a prolonged period of feeding that lasts many hours, days, or even a couple weeks. The bite is painless and frequently goes unnoticed. An infected female tick may also pass the rickettsial bacteria to her eggs that then may mature to be infected adult ticks. The rickettsia that causes RMSF can also get into a person's body through damaged skin. A tick bite is not necessary. People can get RMSF if they crush a tick and get its blood or body fluids on their skin through a small cut or sore.  DIAGNOSIS Diagnosis is made by laboratory tests.   TREATMENT Treatment is with antibiotics (medications that kill rickettsia and other bacteria). Immediate treatment usually prevents death. GEOGRAPHIC RANGE This disease was reported only in the [facility] until 1931. RMSF has more recently been described among individuals in all states except [place], [place], and [place]. The highest reported incidences of RMSF now occur among residents of [place], [place], [place], and the [place]. TIME OF YEAR  Most cases are diagnosed during late spring and summer when ticks are most active. However, especially in the warmer [place] states, a few cases occur during the winter. SYMPTOMS   Symptoms of RMSF begin from 2 to 14 days after a tick bite. The most common early symptoms are fever, muscle aches, and headache followed by nausea (feeling sick to your stomach) or vomiting.  The RMSF rash is typically delayed until 3 or more days after symptom onset, and eventually develops in 9 of 10 infected patients by the fifth day of illness. If the disease is not treated it can cause death. If you get a fever, headache, muscle aches, rash, nausea, or vomiting within 2 weeks of a possible tick bite or exposure, you should see your caregiver immediately. PREVENTION Ticks prefer to hide in shady, moist ground litter. They can often be found above the ground clinging to tall grass, brush, shrubs and low tree branches. They also inhabit lawns and gardens, especially at the edges of woodlands and around old stone walls. Within the areas where ticks generally live, no naturally vegetated area can be considered completely free of infected ticks. The best precaution against RMSF is to avoid contact with soil, leaf litter, and vegetation as much as possible in tick-infested areas. For those who enjoy gardening or walking in their yards, clear brush and mow tall grass around houses and at the edges of gardens. This may help reduce the tick population in the immediate area.  Applications of chemical insecticides by a licensed professional in the spring (late May) and fall (September) will also control ticks, especially in heavily infested areas. Treatment will never get rid of all the ticks. Getting rid of small animal populations that host ticks will also decrease the tick population. When working in the garden, [employer], or handling soil and vegetation, wear light-colored protective clothing and gloves. Spot-check often to prevent ticks from reaching the skin. Ticks cannot jump or fly. They will not drop from an above-ground perch onto a passing animal. Once a tick gains access to human skin it climbs upward until it reaches a more protected area. For example, the back of the knee, groin, navel, armpit, ears, or nape of the neck. It then begins the slow process of embedding itself in the skin. Campers, hikers, field workers, and others who spend time in wooded, brushy, or tall grassy areas can avoid exposure to ticks by using the following precautions:  Wear light-colored clothing with a  tight weave to spot ticks more easily and prevent contact with the skin.  Wear long pants tucked into socks, long-sleeved shirts tucked into pants and enclosed shoes or boots along with insect repellent.  Spray clothes with insect repellent containing either DEET or Permethrin. Only DEET can be used on exposed skin. Follow the manufacturer's directions carefully.  Wear a hat and keep long hair pulled back.  Stay on cleared, well-worn trails whenever possible.  Spot-check yourself and others often for the presence of ticks on clothes. If you find one, there are likely to be others. Check thoroughly.  Remove clothes after leaving tick-infested areas. If possible, wash them to eliminate any unseen ticks. Check yourself, your children and any pets from head to toe for the presence of ticks.  Shower and shampoo. You can greatly reduce your chances of contracting RMSF if you remove  attached ticks as soon as possible. Regular checks of the body, including all body sites covered by hair (head, armpits, genitals), allow removal of the tick before rickettsial transmission. To remove an attached tick, use a forceps or tweezers to detach the intact tick without leaving mouth parts in the skin. The tick bite wound should be cleansed after tick removal. Remember the most common symptoms of RMSF are fever, muscle aches, headache, and nausea or vomiting with a later onset of rash. If you get these symptoms after a tick bite and while living in an area where RMSF is found, RMSF should be suspected. If the disease is not treated, it can cause death. See your caregiver immediately if you get these symptoms. Do this even if not aware of a tick bite. Document Released: 02/05/2001 Document Revised: 03/10/2014 Document Reviewed: 09/28/2009 [facility] Patient Information 2015 [place], [place]. This information is not intended to replace advice given to you by your health care provider. Make sure you discuss any questions you have with your health care provider.

## 2015-03-20 NOTE — Progress Notes (Signed)
Pre visit review using our clinic review tool, if applicable. No additional management support is needed unless otherwise documented below in the visit note. 

## 2015-03-20 NOTE — Progress Notes (Signed)
   Subjective:    Patient ID: Kelly Petersen, female    DOB: 1946-06-13, 69 y.o.   MRN: [id number]  HPI Patient here after spending time outside and being bitten by multiple ticks two days ago. She has picked " a lot" off of her. She has multiple skin welts from where the ticks were attached. She endorses itching but no pain.   Denies any body rash or bulls eye rash   Review of Systems  Skin:       Itching and welts throughout body  All other systems reviewed and are negative.      Objective:   Physical Exam  Constitutional: She is oriented to person, place, and time. She appears well-developed and well-nourished. No distress.  Neurological: She is alert and oriented to person, place, and time.  Skin: Skin is warm and dry.  Pulled two ticks off her right lower extremity.   Multiple red welts on body from tick bites  Psychiatric: She has a normal mood and affect. Her behavior is normal. Judgment and thought content normal.  Nursing note and vitals reviewed.       Assessment & Plan:  1. Tick bite - doxycycline (VIBRAMYCIN) 100 MG capsule; Take 1 capsule (100 mg total) by mouth 2 (two) times daily.  Dispense: 20 capsule; Refill: 0 - Information given on Lyme Disease and RMSF. Advised to come back with any signs and symptoms.  - Can use benadryl cream for itching.

## 2015-04-07 DIAGNOSIS — H18713 Corneal ectasia, bilateral: Secondary | ICD-10-CM | POA: Diagnosis not present

## 2015-04-07 DIAGNOSIS — H52213 Irregular astigmatism, bilateral: Secondary | ICD-10-CM | POA: Diagnosis not present

## 2015-05-06 ENCOUNTER — Telehealth: Payer: Self-pay | Admitting: *Deleted

## 2015-05-06 NOTE — Telephone Encounter (Signed)
Pt walked in today stating that both eyes were red x3 days.  She said she woke up this morning and it was draining mucus.  She states that they are not really matted shut.  It bothers in the morning and night more so than during the day.  She is taking singulair, asthmanex, and systane eye drops and it has not help.  She also states they itch a lot.  No appts available today.  Spoke with Dr Sarajane Jews and per Dr Sarajane Jews he will be happy to call her in some eye drops.  Curtiss.  Pt aware rx will be called in

## 2015-05-07 MED ORDER — TOBRAMYCIN-DEXAMETHASONE 0.3-0.05 % OP SUSP: 2.0000 [drp] | OPHTHALMIC | Status: DC

## 2015-05-07 NOTE — Telephone Encounter (Signed)
rx sent in electronically 

## 2015-05-07 NOTE — Telephone Encounter (Signed)
Call in Tobradex solution to use 2 drops in the eyes every 4 hours prn, 10 ml with no rf

## 2015-05-13 DIAGNOSIS — H4011X1 Primary open-angle glaucoma, mild stage: Secondary | ICD-10-CM | POA: Diagnosis not present

## 2015-05-13 DIAGNOSIS — H18713 Corneal ectasia, bilateral: Secondary | ICD-10-CM | POA: Diagnosis not present

## 2015-05-13 DIAGNOSIS — H52213 Irregular astigmatism, bilateral: Secondary | ICD-10-CM | POA: Diagnosis not present

## 2015-05-13 DIAGNOSIS — H2513 Age-related nuclear cataract, bilateral: Secondary | ICD-10-CM | POA: Diagnosis not present

## 2015-05-20 DIAGNOSIS — Z0000 Encounter for general adult medical examination without abnormal findings: Secondary | ICD-10-CM | POA: Diagnosis not present

## 2015-07-23 DIAGNOSIS — L821 Other seborrheic keratosis: Secondary | ICD-10-CM | POA: Diagnosis not present

## 2015-08-01 ENCOUNTER — Other Ambulatory Visit: Payer: Self-pay | Admitting: Family

## 2015-08-03 ENCOUNTER — Encounter: Payer: Self-pay | Admitting: Family Medicine

## 2015-08-03 ENCOUNTER — Ambulatory Visit (INDEPENDENT_AMBULATORY_CARE_PROVIDER_SITE_OTHER): Payer: Medicare Other | Admitting: Family Medicine

## 2015-08-03 VITALS — BP 98/60 | HR 108 | Temp 98.1°F | Ht 61.5 in | Wt 120.7 lb

## 2015-08-03 DIAGNOSIS — J4530 Mild persistent asthma, uncomplicated: Secondary | ICD-10-CM

## 2015-08-03 DIAGNOSIS — E785 Hyperlipidemia, unspecified: Secondary | ICD-10-CM | POA: Diagnosis not present

## 2015-08-03 DIAGNOSIS — J309 Allergic rhinitis, unspecified: Secondary | ICD-10-CM

## 2015-08-03 DIAGNOSIS — F3342 Major depressive disorder, recurrent, in full remission: Secondary | ICD-10-CM | POA: Diagnosis not present

## 2015-08-03 DIAGNOSIS — Z7189 Other specified counseling: Secondary | ICD-10-CM | POA: Diagnosis not present

## 2015-08-03 DIAGNOSIS — Z23 Encounter for immunization: Secondary | ICD-10-CM

## 2015-08-03 DIAGNOSIS — Z7689 Persons encountering health services in other specified circumstances: Secondary | ICD-10-CM

## 2015-08-03 MED ORDER — MOMETASONE FUROATE 220 MCG/INH IN AEPB: 2.0000 | INHALATION_SPRAY | Freq: Two times a day (BID) | RESPIRATORY_TRACT | Status: DC

## 2015-08-03 MED ORDER — TRAZODONE HCL 50 MG PO TABS: 50.0000 mg | ORAL_TABLET | ORAL | Status: DC

## 2015-08-03 MED ORDER — MONTELUKAST SODIUM 10 MG PO TABS: 10.0000 mg | ORAL_TABLET | Freq: Every day | ORAL | Status: DC

## 2015-08-03 MED ORDER — ROSUVASTATIN CALCIUM 5 MG PO TABS: 5.0000 mg | ORAL_TABLET | Freq: Every day | ORAL | Status: DC

## 2015-08-03 NOTE — Progress Notes (Signed)
HPI:  Kelly Petersen is here to establish care. She used to [employer]. She sees Dr. Alwyn Pea in gyn, breast and bone health yearly.  Has the following chronic problems that require follow up and concerns today:  Asthma and Allergens: -dx in 1998 -never hospitalized -used to see a specialist in [place] -she has frequent symptoms unless on asmanex and singulair chronically   Hyperlipidemia/Prediabetes: -for > 8 years -no regular aerobic; poor diet -on crestor 5 for a long time -had labs with lifes screening and these looked good recently  Depression/Insomnia: -for several years but now resolved - on zoloft in the past and worked well -takes low dose of trazadone 3 times per week chronically for poor sleep - did with prior PCP "forever" -denies: SI, depression, hx of hospitalization  Eczema: -sees skin surgery center, uses clobetasol sol  Osteopenia: -sees gyn for this    ROS negative for unless reported above: fevers, unintentional weight loss, hearing or vision loss, chest pain, palpitations, struggling to breath, hemoptysis, melena, hematochezia, hematuria, falls, loc, si, thoughts of self harm  Past Medical History  Diagnosis Date  . Endometrial cancer   . Chicken pox   . Depression   . Hyperlipidemia   . Glaucoma   . Asthma   . Environmental allergies   . Hyperglycemia   . Insomnia   . Osteopenia   . Eczema     Past Surgical History  Procedure Laterality Date  . Breast surgery    . 2004    . Abdominal hysterectomy      Family History  Problem Relation Age of Onset  . Lung cancer    . Hyperlipidemia      Social History   Social History  . Marital Status: Single    Spouse Name: N/A  . Number of Children: N/A  . Years of Education: N/A   Social History Main Topics  . Smoking status: Never Smoker   . Smokeless tobacco: None  . Alcohol Use: Yes     Comment: 1 drink rarely  . Drug Use: No  . Sexual Activity: Not Asked   Other Topics Concern   . None   Social History Narrative   Work or School: cares for grandchild      Home Situation: lives alone      Spiritual Beliefs: Christian, no church currently      Lifestyle: no regular exercise; diet is poor           Current outpatient prescriptions:  .  Brimonidine Tartrate-Timolol (COMBIGAN OP), Apply 1 drop to eye 2 (two) times daily., Disp: , Rfl:  .  clobetasol (TEMOVATE) 0.05 % external solution, Apply 1 application topically as needed., Disp: , Rfl: 0 .  mometasone (ASMANEX) 220 MCG/INH inhaler, Inhale 2 puffs into the lungs 2 (two) times daily., Disp: 3 Inhaler, Rfl: 3 .  montelukast (SINGULAIR) 10 MG tablet, Take 1 tablet (10 mg total) by mouth at bedtime., Disp: 90 tablet, Rfl: 3 .  raloxifene (EVISTA) 60 MG tablet, Take 60 mg by mouth daily., Disp: , Rfl:  .  rosuvastatin (CRESTOR) 5 MG tablet, Take 1 tablet (5 mg total) by mouth daily., Disp: 90 tablet, Rfl: 3 .  traZODone (DESYREL) 50 MG tablet, Take 1 tablet (50 mg total) by mouth 3 (three) times a week. Before bed for poor sleep as needed., Disp: 30 tablet, Rfl: 3  EXAM:  Filed Vitals:   08/03/15 1114  BP: 98/60  Pulse: 108  Temp: 98.1 F (  36.7 C)    Body mass index is 22.44 kg/(m^2).  GENERAL: vitals reviewed and listed above, alert, oriented, appears well hydrated and in no acute distress  HEENT: atraumatic, conjunttiva clear, no obvious abnormalities on inspection of external nose and ears  NECK: no obvious masses on inspection  LUNGS: clear to auscultation bilaterally, no wheezes, rales or rhonchi, good air movement  CV: HRRR, no peripheral edema  MS: moves all extremities without noticeable abnormality  PSYCH: pleasant and cooperative, no obvious depression or anxiety  ASSESSMENT AND PLAN:  Discussed the following assessment and plan:  Encounter to establish care  Asthma, mild persistent, uncomplicated  Hyperlipemia  Major depressive disorder, recurrent, in full  remission  Allergic rhinitis, unspecified allergic rhinitis type  -We reviewed the PMH, PSH, FH, SH, Meds and Allergies. -We provided refills for any medications we will prescribe as needed. -We addressed current concerns per orders and patient instructions. -We have asked for records for pertinent exams, studies, vaccines and notes from previous providers. -We have advised patient to follow up per instructions below. -meds refilled -flu shot -medicare wellness visit in 3-4 months  -Patient advised to return or notify a doctor immediately if symptoms worsen or persist or new concerns arise.  Patient Instructions  BEFORE YOU LEAVE: -flu shot -schedule Medicare Wellness Visit in 3-4 months  We recommend the following healthy lifestyle measures: - eat a healthy diet consisting of lots of vegetables, fruits, beans, nuts, seeds, healthy meats such as white chicken and fish and whole grains.  - avoid white starches, sweets, fried foods, fast food, processed foods, sodas, red meet and other fattening foods.  - get a least 150-300 minutes of aerobic exercise per week.     FOR IMPROVED SLEEP AND TO RESET YOUR SLEEP SCHEDULE: []  exercise 30 minutes daily  []  go to bed and wake up at the same time  []  keep bedroom cool, dark and quiet  []  reserve bed for sleep - do not read, watch TV, etc in bed  []  If you toss and turn more then 15-20 minutes get out of bed and list thoughts/do quite activity then go back to bed; repeat as needed; do not worry about when you eventually fall asleep - still get up at the same time and turn on lights and take shower  [] get counseling  []  some people find that a half dose of benadryl, melatonin, tylenol pm or unisom on a few nights per week is helpful initially for a few weeks  [] seek help and treat any depression or anxiety  [] prescription strength sleep medications should only be used in severe cases of insomnia if other measures fail and should be  used sparingly      KIM, Jarrett Soho R.

## 2015-08-03 NOTE — Patient Instructions (Addendum)
BEFORE YOU LEAVE: -flu shot -schedule Medicare Wellness Visit in 3-4 months  We recommend the following healthy lifestyle measures: - eat a healthy diet consisting of lots of vegetables, fruits, beans, nuts, seeds, healthy meats such as white chicken and fish and whole grains.  - avoid white starches, sweets, fried foods, fast food, processed foods, sodas, red meet and other fattening foods.  - get a least 150-300 minutes of aerobic exercise per week.     FOR IMPROVED SLEEP AND TO RESET YOUR SLEEP SCHEDULE: []  exercise 30 minutes daily  []  go to bed and wake up at the same time  []  keep bedroom cool, dark and quiet  []  reserve bed for sleep - do not read, watch TV, etc in bed  []  If you toss and turn more then 15-20 minutes get out of bed and list thoughts/do quite activity then go back to bed; repeat as needed; do not worry about when you eventually fall asleep - still get up at the same time and turn on lights and take shower  [] get counseling  []  some people find that a half dose of benadryl, melatonin, tylenol pm or unisom on a few nights per week is helpful initially for a few weeks  [] seek help and treat any depression or anxiety  [] prescription strength sleep medications should only be used in severe cases of insomnia if other measures fail and should be used sparingly

## 2015-08-03 NOTE — Progress Notes (Signed)
Pre visit review using our clinic review tool, if applicable. No additional management support is needed unless otherwise documented below in the visit note. 

## 2015-08-05 NOTE — Telephone Encounter (Signed)
I received a refill request for Zoloft. This medication is only on the patient's historical med list. Is it okay to refill this medication?

## 2015-08-19 ENCOUNTER — Encounter: Payer: Self-pay | Admitting: Family Medicine

## 2015-08-19 ENCOUNTER — Ambulatory Visit (INDEPENDENT_AMBULATORY_CARE_PROVIDER_SITE_OTHER): Payer: Medicare Other | Admitting: Family Medicine

## 2015-08-19 VITALS — BP 104/80 | HR 111 | Temp 98.5°F | Ht 61.5 in | Wt 118.9 lb

## 2015-08-19 DIAGNOSIS — J069 Acute upper respiratory infection, unspecified: Secondary | ICD-10-CM | POA: Diagnosis not present

## 2015-08-19 NOTE — Patient Instructions (Signed)
INSTRUCTIONS FOR UPPER RESPIRATORY INFECTION:  -plenty of rest and fluids  -nasal saline wash 2-3 times daily (use prepackaged nasal saline or bottled/distilled water if making your own)   -can use AFRIN nasal spray for drainage and nasal congestion - but do NOT use longer then 3-4 days  -can use tylenol (in no history of liver disease) or ibuprofen (if no history of kidney disease, bowel bleeding or significant heart disease) as directed for aches and sorethroat  -in the winter time, using a humidifier at night is helpful (please follow cleaning instructions)  -if you are taking a cough medication - use only as directed, may also try a teaspoon of honey to coat the throat and throat lozenges. If given a cough medication with codeine or hydrocodone or other narcotic please be advised that this contains a strong and  potentially addicting medication. Please follow instructions carefully, take as little as possible and only use AS NEEDED for severe cough. Discuss potential side effects with your pharmacy. Please do not drive or operate machinery while taking these types of medications. Please do not take other sedating medications, drugs or alcohol while taking this medication without discussing with your doctor.  -for sore throat, salt water gargles can help  -follow up if you have fevers, facial pain, tooth pain, difficulty breathing or are worsening or symptoms persist longer then expected  Upper Respiratory Infection, Adult An upper respiratory infection (URI) is also known as the common cold. It is often caused by a type of germ (virus). Colds are easily spread (contagious). You can pass it to others by kissing, coughing, sneezing, or drinking out of the same glass. Usually, you get better in 1 to 3  weeks.  However, the cough can last for even longer. HOME CARE   Only take medicine as told by your doctor. Follow instructions provided above.  Drink enough water and fluids to keep your pee  (urine) clear or pale yellow.  Get plenty of rest.  Return to work when your temperature is < 100 for 24 hours or as told by your doctor. You may use a face mask and wash your hands to stop your cold from spreading. GET HELP RIGHT AWAY IF:   After the first few days, you feel you are getting worse.  You have questions about your medicine.  You have fevers, shortness of breath, or red spit (mucus).  You have pain in the face for more then 2-3 days, especially when you bend forward.  You have a fever, puffy (swollen) neck, pain when you swallow, or white spots in the back of your throat.  You have a severe headache, ear pain, sinus pain that persists, or chest pain.  You have a high-pitched whistling sound when you breathe in and out (wheezing).  You cough up blood.  You have sore muscles or a stiff neck. MAKE SURE YOU:   Understand these instructions.  Will watch your condition.  Will get help right away if you are not doing well or get worse. Document Released: 04/11/2008 Document Revised: 01/16/2012 Document Reviewed: 01/29/2014 [facility] Patient Information 2015 [place], [place]. This information is not intended to replace advice given to you by your health care provider. Make sure you discuss any questions you have with your health care provider.

## 2015-08-19 NOTE — Progress Notes (Signed)
HPI:  URI: -started: 2 - 3 days ago, feeling a little better today -symptoms:nasal congestion, sore throat, cough, sinus pain and pressure, fatigue, body aches -denies:fever, SOB, NVD -has tried: musinex and OTC cold medication -sick contacts/travel/risks: denies flu exposure, tick exposure or or Ebola risks -Hx of: allergies and asthma - no asthma symptoms currently  ROS: See pertinent positives and negatives per HPI.  Past Medical History  Diagnosis Date  . Endometrial cancer ([place])   . Chicken pox   . Depression   . Hyperlipidemia   . Glaucoma   . Asthma   . Environmental allergies   . Hyperglycemia   . Insomnia   . Osteopenia   . Eczema     Past Surgical History  Procedure Laterality Date  . Breast surgery    . 2004    . Abdominal hysterectomy      Family History  Problem Relation Age of Onset  . Lung cancer    . Hyperlipidemia      Social History   Social History  . Marital Status: Single    Spouse Name: N/A  . Number of Children: N/A  . Years of Education: N/A   Social History Main Topics  . Smoking status: Never Smoker   . Smokeless tobacco: None  . Alcohol Use: Yes     Comment: 1 drink rarely  . Drug Use: No  . Sexual Activity: Not Asked   Other Topics Concern  . None   Social History Narrative   Work or School: cares for grandchild      Home Situation: lives alone      Spiritual Beliefs: Christian, no church currently      Lifestyle: no regular exercise; diet is poor           Current outpatient prescriptions:  .  Brimonidine Tartrate-Timolol (COMBIGAN OP), Apply 1 drop to eye 2 (two) times daily., Disp: , Rfl:  .  clobetasol (TEMOVATE) 0.05 % external solution, Apply 1 application topically as needed., Disp: , Rfl: 0 .  mometasone (ASMANEX) 220 MCG/INH inhaler, Inhale 2 puffs into the lungs 2 (two) times daily., Disp: 3 Inhaler, Rfl: 3 .  montelukast (SINGULAIR) 10 MG tablet, Take 1 tablet (10 mg total) by mouth at bedtime.,  Disp: 90 tablet, Rfl: 3 .  Pseudoephedrine-Ibuprofen (ADVIL COLD/SINUS PO), Take by mouth as needed., Disp: , Rfl:  .  raloxifene (EVISTA) 60 MG tablet, Take 60 mg by mouth daily., Disp: , Rfl:  .  rosuvastatin (CRESTOR) 5 MG tablet, Take 1 tablet (5 mg total) by mouth daily., Disp: 90 tablet, Rfl: 3 .  traZODone (DESYREL) 50 MG tablet, Take 1 tablet (50 mg total) by mouth 3 (three) times a week. Before bed for poor sleep as needed., Disp: 30 tablet, Rfl: 3  EXAM:  Filed Vitals:   08/19/15 1128  BP: 104/80  Pulse: 111  Temp: 98.5 F (36.9 C)    Body mass index is 22.11 kg/(m^2).  GENERAL: vitals reviewed and listed above, alert, oriented, appears well hydrated and in no acute distress  HEENT: atraumatic, conjunttiva clear, PERRLA, no obvious abnormalities on inspection of external nose and ears, normal appearance of ear canals and TMs, clear nasal congestion, mild post oropharyngeal erythema with PND, no tonsillar edema or exudate, no sinus TTP  NECK: no obvious masses on inspection  LUNGS: clear to auscultation bilaterally, no wheezes, rales or rhonchi, good air movement  CV: HRRR, no peripheral edema  MS: moves all extremities without noticeable  abnormality  PSYCH: pleasant and cooperative, no obvious depression or anxiety  NEURO: CN II-XII grossly intact, finger to nose normal, speech, gait and thought processing grossly intact  ASSESSMENT AND PLAN:  Discussed the following assessment and plan:  Acute upper respiratory infection  -given HPI and exam findings today, a serious infection or illness is unlikely. We discussed potential etiologies, with VURI being most likely, and advised supportive care and monitoring. We discussed treatment side effects, likely course, antibiotic misuse, transmission, and signs of developing a serious illness. -of course, we advised to return or notify a doctor immediately if symptoms worsen or persist or new concerns arise.    Patient  Instructions  INSTRUCTIONS FOR UPPER RESPIRATORY INFECTION:  -plenty of rest and fluids  -nasal saline wash 2-3 times daily (use prepackaged nasal saline or bottled/distilled water if making your own)   -can use AFRIN nasal spray for drainage and nasal congestion - but do NOT use longer then 3-4 days  -can use tylenol (in no history of liver disease) or ibuprofen (if no history of kidney disease, bowel bleeding or significant heart disease) as directed for aches and sorethroat  -in the winter time, using a humidifier at night is helpful (please follow cleaning instructions)  -if you are taking a cough medication - use only as directed, may also try a teaspoon of honey to coat the throat and throat lozenges. If given a cough medication with codeine or hydrocodone or other narcotic please be advised that this contains a strong and  potentially addicting medication. Please follow instructions carefully, take as little as possible and only use AS NEEDED for severe cough. Discuss potential side effects with your pharmacy. Please do not drive or operate machinery while taking these types of medications. Please do not take other sedating medications, drugs or alcohol while taking this medication without discussing with your doctor.  -for sore throat, salt water gargles can help  -follow up if you have fevers, facial pain, tooth pain, difficulty breathing or are worsening or symptoms persist longer then expected  Upper Respiratory Infection, Adult An upper respiratory infection (URI) is also known as the common cold. It is often caused by a type of germ (virus). Colds are easily spread (contagious). You can pass it to others by kissing, coughing, sneezing, or drinking out of the same glass. Usually, you get better in 1 to 3  weeks.  However, the cough can last for even longer. HOME CARE   Only take medicine as told by your doctor. Follow instructions provided above.  Drink enough water and fluids to  keep your pee (urine) clear or pale yellow.  Get plenty of rest.  Return to work when your temperature is < 100 for 24 hours or as told by your doctor. You may use a face mask and wash your hands to stop your cold from spreading. GET HELP RIGHT AWAY IF:   After the first few days, you feel you are getting worse.  You have questions about your medicine.  You have fevers, shortness of breath, or red spit (mucus).  You have pain in the face for more then 2-3 days, especially when you bend forward.  You have a fever, puffy (swollen) neck, pain when you swallow, or white spots in the back of your throat.  You have a severe headache, ear pain, sinus pain that persists, or chest pain.  You have a high-pitched whistling sound when you breathe in and out (wheezing).  You cough up blood.  You have sore muscles or a stiff neck. MAKE SURE YOU:   Understand these instructions.  Will watch your condition.  Will get help right away if you are not doing well or get worse. Document Released: 04/11/2008 Document Revised: 01/16/2012 Document Reviewed: 01/29/2014 [facility] Patient Information 2015 [place], [place]. This information is not intended to replace advice given to you by your health care provider. Make sure you discuss any questions you have with your health care provider.      Colin Benton R.

## 2015-08-19 NOTE — Progress Notes (Signed)
Pre visit review using our clinic review tool, if applicable. No additional management support is needed unless otherwise documented below in the visit note. 

## 2015-09-23 DIAGNOSIS — H18713 Corneal ectasia, bilateral: Secondary | ICD-10-CM | POA: Diagnosis not present

## 2015-09-23 DIAGNOSIS — H2513 Age-related nuclear cataract, bilateral: Secondary | ICD-10-CM | POA: Diagnosis not present

## 2015-09-23 DIAGNOSIS — H401134 Primary open-angle glaucoma, bilateral, indeterminate stage: Secondary | ICD-10-CM | POA: Diagnosis not present

## 2015-09-23 DIAGNOSIS — H52213 Irregular astigmatism, bilateral: Secondary | ICD-10-CM | POA: Diagnosis not present

## 2015-09-23 DIAGNOSIS — H401130 Primary open-angle glaucoma, bilateral, stage unspecified: Secondary | ICD-10-CM | POA: Diagnosis not present

## 2015-11-04 ENCOUNTER — Encounter: Payer: Self-pay | Admitting: *Deleted

## 2015-11-06 DIAGNOSIS — J00 Acute nasopharyngitis [common cold]: Secondary | ICD-10-CM | POA: Diagnosis not present

## 2015-11-17 ENCOUNTER — Ambulatory Visit: Payer: BLUE CROSS/BLUE SHIELD | Admitting: Family Medicine

## 2015-12-09 ENCOUNTER — Encounter: Payer: Self-pay | Admitting: Family Medicine

## 2015-12-09 ENCOUNTER — Ambulatory Visit (INDEPENDENT_AMBULATORY_CARE_PROVIDER_SITE_OTHER): Payer: Medicare Other | Admitting: Family Medicine

## 2015-12-09 VITALS — BP 82/58 | HR 80 | Temp 98.3°F | Ht 61.25 in | Wt 119.5 lb

## 2015-12-09 DIAGNOSIS — J309 Allergic rhinitis, unspecified: Secondary | ICD-10-CM

## 2015-12-09 DIAGNOSIS — E785 Hyperlipidemia, unspecified: Secondary | ICD-10-CM | POA: Diagnosis not present

## 2015-12-09 DIAGNOSIS — Z8542 Personal history of malignant neoplasm of other parts of uterus: Secondary | ICD-10-CM | POA: Diagnosis not present

## 2015-12-09 DIAGNOSIS — F3342 Major depressive disorder, recurrent, in full remission: Secondary | ICD-10-CM | POA: Diagnosis not present

## 2015-12-09 DIAGNOSIS — Z23 Encounter for immunization: Secondary | ICD-10-CM

## 2015-12-09 DIAGNOSIS — Z1159 Encounter for screening for other viral diseases: Secondary | ICD-10-CM

## 2015-12-09 DIAGNOSIS — Z0000 Encounter for general adult medical examination without abnormal findings: Secondary | ICD-10-CM

## 2015-12-09 DIAGNOSIS — J4530 Mild persistent asthma, uncomplicated: Secondary | ICD-10-CM | POA: Diagnosis not present

## 2015-12-09 DIAGNOSIS — R739 Hyperglycemia, unspecified: Secondary | ICD-10-CM

## 2015-12-09 NOTE — Progress Notes (Signed)
Pre visit review using our clinic review tool, if applicable. No additional management support is needed unless otherwise documented below in the visit note. 

## 2015-12-09 NOTE — Progress Notes (Signed)
Medicare Annual Preventive Care Visit  (initial annual wellness or annual wellness exam)  Concerns and/or follow up today:  Asthma and Allergens: -dx in 1998 -never hospitalized -used to see a specialist in [place], wants name of allergist here -she has frequent symptoms unless on asmanex and singulair chronically   Hyperlipidemia/Prediabetes: -had hgba1c 05/2015 with lifeline screening - < 6.4 -for > 8 years -no regular aerobic exercise; poor diet -on crestor 5mg  for a long time -had labs with lifes screening and these looked good recently -reports lifelong low blood pressure and occ lightheaded when stand suddenly if does not eat beef?  Depression/Insomnia: -for several years but now resolved - on zoloft in the past and worked well -takes low dose of trazadone 3 times per week chronically for poor sleep - rxd by prior PCP "forever" -denies: SI, depression, hx of hospitalization  Eczema: -sees skin surgery center, uses clobetasol sol  Osteopenia: -sees gyn for this  ROS: negative for report of fevers, unintentional weight loss, vision changes, vision loss, hearing loss or change, chest pain, sob, hemoptysis, melena, hematochezia, hematuria, genital discharge or lesions, falls, bleeding or bruising, loc, thoughts of suicide or self harm, memory loss  1.) Patient-completed health risk assessment  - completed and reviewed, see scanned documentation  2.) Review of Medical History: -PMH, PSH, Family History and current specialty and care providers reviewed and updated and listed below  - see scanned in document in chart and below  Past Medical History  Diagnosis Date  . Endometrial cancer ([place])   . Chicken pox   . Depression   . Hyperlipidemia   . Glaucoma   . Asthma   . Environmental allergies   . Hyperglycemia   . Insomnia   . Osteopenia   . Eczema     Past Surgical History  Procedure Laterality Date  . Breast surgery    . 2004    . Abdominal hysterectomy       Social History   Social History  . Marital Status: Single    Spouse Name: N/A  . Number of Children: N/A  . Years of Education: N/A   Occupational History  . Not on file.   Social History Main Topics  . Smoking status: Never Smoker   . Smokeless tobacco: Not on file  . Alcohol Use: Yes     Comment: 1 drink rarely  . Drug Use: No  . Sexual Activity: Not on file   Other Topics Concern  . Not on file   Social History Narrative   Work or School: cares for grandchild      Home Situation: lives alone      Spiritual Beliefs: Christian, no church currently      Lifestyle: no regular exercise; diet is poor          Family History  Problem Relation Age of Onset  . Lung cancer    . Hyperlipidemia      Current Outpatient Prescriptions on File Prior to Visit  Medication Sig Dispense Refill  . Brimonidine Tartrate-Timolol (COMBIGAN OP) Apply 1 drop to eye 2 (two) times daily.    . clobetasol (TEMOVATE) 0.05 % external solution Apply 1 application topically as needed.  0  . mometasone (ASMANEX) 220 MCG/INH inhaler Inhale 2 puffs into the lungs 2 (two) times daily. 3 Inhaler 3  . montelukast (SINGULAIR) 10 MG tablet Take 1 tablet (10 mg total) by mouth at bedtime. 90 tablet 3  . Pseudoephedrine-Ibuprofen (ADVIL COLD/SINUS PO) Take by mouth  as needed.    . raloxifene (EVISTA) 60 MG tablet Take 60 mg by mouth daily.    . rosuvastatin (CRESTOR) 5 MG tablet Take 1 tablet (5 mg total) by mouth daily. 90 tablet 3  . traZODone (DESYREL) 50 MG tablet Take 1 tablet (50 mg total) by mouth 3 (three) times a week. Before bed for poor sleep as needed. 30 tablet 3   No current facility-administered medications on file prior to visit.     3.) Review of functional ability and level of safety:  Any difficulty hearing?  NO  History of falling?  NO  Any trouble with IADLs - using a phone, using transportation, grocery shopping, preparing meals, doing housework, doing laundry, taking  medications and managing money? NO  Advance Directives? NO  See summary of recommendations in Patient Instructions below.  4.) Physical Exam Filed Vitals:   12/09/15 1130  BP: 82/58  Pulse: 80  Temp: 98.3 F (36.8 C)   Estimated body mass index is 22.39 kg/(m^2) as calculated from the following:   Height as of this encounter: 5' 1.25" (1.556 m).   Weight as of this encounter: 119 lb 8 oz (54.205 kg).  EKG (optional): deferred  General: alert, appear well hydrated and in no acute distress  HEENT: visual acuity grossly intact  CV: HRRR  Lungs: CTA bilaterally  Psych: pleasant and cooperative, no obvious depression or anxiety  Cognitive function grossly intact  See patient instructions for recommendations.  Education and counseling regarding the above review of health provided with a plan for the following: -see scanned patient completed form for further details -fall prevention strategies discussed  -healthy lifestyle discussed -importance and resources for completing advanced directives discussed -see patient instructions below for any other recommendations provided  4)The following written screening schedule of preventive measures were reviewed with assessment and plan made per below, orders and patient instructions:           Alcohol screening done     Obesity Screening and counseling done     STI screening (Hep C if born 1947-65) hep c today     Tobacco Screening done       Pneumococcal (PPSV23 -one dose after 64, one before if risk factors), influenza yearly and hepatitis B vaccines (if high risk - end stage renal disease, IV drugs, homosexual men, live in home for mentally retarded, hemophilia receiving factors) ASSESSMENT/PLAN: advised today, done today      Screening mammograph (yearly if >40) ASSESSMENT/PLAN: done 09/2014, sees Dr. Alwyn Pea in gyn for bone and breast health - [facility] - is changing providers as Dr. Alwyn Pea left      Screening Pap  smear/pelvic exam (q2 years) ASSESSMENT/PLAN: sees gyn      Colorectal cancer screening (FOBT yearly or flex sig q4y or colonoscopy q10y or barium enema q4y) ASSESSMENT/PLAN: utd per HM section - advised assistant to obtain copy, pt reports done in Jan 2015 and was told to repeat in 10 years      Diabetes outpatient self-management training services ASSESSMENT/PLAN: utd or done      Bone mass measurements(covered q2y if indicated - estrogen def, osteoporosis, hyperparathyroid, vertebral abnormalities, osteoporosis or steroids) ASSESSMENT/PLAN: done 11/2014, sees gyn for this      Screening for glaucoma(q1y if high risk - diabetes, FH, AA and > 50 or hispanic and > 65) ASSESSMENT/PLAN: utd - has glaucoma and sees optho annually      Medical nutritional therapy for individuals with diabetes or renal  disease ASSESSMENT/PLAN: see orders      Cardiovascular screening blood tests (lipids q5y) ASSESSMENT/PLAN: see orders and labs      Diabetes screening tests ASSESSMENT/PLAN: see orders and labs   7.) Summary: -risk factors and conditions per above assessment were discussed and treatment, recommendations and referrals were offered per documentation above and orders and patient instructions.  Medicare annual wellness visit, initial  Hyperlipemia - Plan: Lipid Panel  Recurrent major depressive disorder, in full remission ([place])  Asthma, mild persistent, uncomplicated  Allergic rhinitis, unspecified allergic rhinitis type  History of uterine cancer - Plan: CBC with Differential  Hyperglycemia - Plan: Basic metabolic panel  Need for hepatitis C screening test - Plan: Hepatitis C antibody  Patient Instructions  BEFORE YOU LEAVE: -pneumococcal 23 -permission and obtain records from last colonoscopy with Dr. Debbe Bales -schedule lab appointment - fast for 8 hours but please drink plenty of water -follow up appointment in 6 months  -please bring a copy of your advanced directives to  our office  -We have ordered labs or studies at this visit. It can take up to 1-2 weeks for results and processing. We will contact you with instructions IF your results are abnormal. Normal results will be released to your [facility]. If you have not heard from [place] or can not find your results in [facility] in 2 weeks please contact our office.  We recommend the following healthy lifestyle measures: - eat a healthy whole foods diet consisting of regular small meals composed of vegetables, fruits, beans, nuts, seeds, healthy meats such as white chicken and fish and whole grains.  - avoid sweets, white starchy foods, fried foods, fast food, processed foods, sodas, red meet and other fattening foods.  - get a least 150-300 minutes of aerobic exercise per week.

## 2015-12-09 NOTE — Patient Instructions (Signed)
BEFORE YOU LEAVE: -pneumococcal 23 -permission and obtain records from last colonoscopy with Dr. Debbe Bales -schedule lab appointment - fast for 8 hours but please drink plenty of water -follow up appointment in 6 months  -please bring a copy of your advanced directives to our office  -We have ordered labs or studies at this visit. It can take up to 1-2 weeks for results and processing. We will contact you with instructions IF your results are abnormal. Normal results will be released to your [facility] If you have not heard from [place] or can not find your results in [facility] in 2 weeks please contact our office.  We recommend the following healthy lifestyle measures: - eat a healthy whole foods diet consisting of regular small meals composed of vegetables, fruits, beans, nuts, seeds, healthy meats such as white chicken and fish and whole grains.  - avoid sweets, white starchy foods, fried foods, fast food, processed foods, sodas, red meet and other fattening foods.  - get a least 150-300 minutes of aerobic exercise per week.

## 2015-12-09 NOTE — Addendum Note (Signed)
Addended by: Agnes Lawrence on: 12/09/2015 12:28 PM   Modules accepted: Orders

## 2015-12-14 ENCOUNTER — Other Ambulatory Visit: Payer: Medicare Other

## 2015-12-14 DIAGNOSIS — Z1159 Encounter for screening for other viral diseases: Secondary | ICD-10-CM | POA: Diagnosis not present

## 2015-12-14 LAB — CBC WITH DIFFERENTIAL/PLATELET
BASOS ABS: 0.1 10*3/uL (ref 0.0–0.1)
Basophils Relative: 0.9 % (ref 0.0–3.0)
EOS ABS: 0.5 10*3/uL (ref 0.0–0.7)
Eosinophils Relative: 5.6 % — ABNORMAL HIGH (ref 0.0–5.0)
HEMATOCRIT: 42.6 % (ref 36.0–46.0)
Hemoglobin: 13.9 g/dL (ref 12.0–15.0)
LYMPHS ABS: 2.3 10*3/uL (ref 0.7–4.0)
LYMPHS PCT: 24.5 % (ref 12.0–46.0)
MCHC: 32.7 g/dL (ref 30.0–36.0)
MCV: 95.6 fl (ref 78.0–100.0)
Monocytes Absolute: 0.6 10*3/uL (ref 0.1–1.0)
Monocytes Relative: 6.7 % (ref 3.0–12.0)
NEUTROS ABS: 5.8 10*3/uL (ref 1.4–7.7)
NEUTROS PCT: 62.3 % (ref 43.0–77.0)
PLATELETS: 260 10*3/uL (ref 150.0–400.0)
RBC: 4.45 Mil/uL (ref 3.87–5.11)
RDW: 13.4 % (ref 11.5–15.5)
WBC: 9.3 10*3/uL (ref 4.0–10.5)

## 2015-12-14 LAB — LIPID PANEL
Cholesterol: 169 mg/dL (ref 0–200)
HDL: 48.2 mg/dL (ref >39.00)
NONHDL: 121.27
TRIGLYCERIDES: 280 mg/dL — AB (ref 0.0–149.0)
Total CHOL/HDL Ratio: 4
VLDL: 56 mg/dL — AB (ref 0.0–40.0)

## 2015-12-14 LAB — BASIC METABOLIC PANEL
BUN: 15 mg/dL (ref 6–23)
CALCIUM: 9.7 mg/dL (ref 8.4–10.5)
CO2: 28 meq/L (ref 19–32)
CREATININE: 0.96 mg/dL (ref 0.40–1.20)
Chloride: 102 mEq/L (ref 96–112)
GFR: 60.78 mL/min (ref >60.00)
Glucose, Bld: 93 mg/dL (ref 70–99)
Potassium: 4.3 mEq/L (ref 3.5–5.1)
Sodium: 139 mEq/L (ref 135–145)

## 2015-12-14 LAB — LDL CHOLESTEROL, DIRECT: Direct LDL: 93 mg/dL

## 2015-12-15 LAB — HEPATITIS C ANTIBODY: HCV AB: NEGATIVE

## 2015-12-16 ENCOUNTER — Encounter: Payer: Self-pay | Admitting: Family Medicine

## 2015-12-17 ENCOUNTER — Encounter: Payer: Self-pay | Admitting: *Deleted

## 2015-12-23 DIAGNOSIS — Z1289 Encounter for screening for malignant neoplasm of other sites: Secondary | ICD-10-CM | POA: Diagnosis not present

## 2015-12-24 ENCOUNTER — Other Ambulatory Visit: Payer: Self-pay | Admitting: Obstetrics

## 2015-12-24 DIAGNOSIS — Z1231 Encounter for screening mammogram for malignant neoplasm of breast: Secondary | ICD-10-CM

## 2016-01-27 ENCOUNTER — Ambulatory Visit
Admission: RE | Admit: 2016-01-27 | Discharge: 2016-01-27 | Disposition: A | Discharge status: Home / Self Care | Payer: Medicare Other | Source: Ambulatory Visit | Attending: Obstetrics | Admitting: Obstetrics

## 2016-01-27 DIAGNOSIS — Z1231 Encounter for screening mammogram for malignant neoplasm of breast: Secondary | ICD-10-CM | POA: Diagnosis not present

## 2016-02-04 DIAGNOSIS — L821 Other seborrheic keratosis: Secondary | ICD-10-CM | POA: Diagnosis not present

## 2016-02-04 DIAGNOSIS — Z85828 Personal history of other malignant neoplasm of skin: Secondary | ICD-10-CM | POA: Diagnosis not present

## 2016-02-04 DIAGNOSIS — L719 Rosacea, unspecified: Secondary | ICD-10-CM | POA: Diagnosis not present

## 2016-02-04 DIAGNOSIS — D1801 Hemangioma of skin and subcutaneous tissue: Secondary | ICD-10-CM | POA: Diagnosis not present

## 2016-03-30 DIAGNOSIS — H401131 Primary open-angle glaucoma, bilateral, mild stage: Secondary | ICD-10-CM | POA: Diagnosis not present

## 2016-03-30 DIAGNOSIS — H2513 Age-related nuclear cataract, bilateral: Secondary | ICD-10-CM | POA: Diagnosis not present

## 2016-03-30 DIAGNOSIS — H52213 Irregular astigmatism, bilateral: Secondary | ICD-10-CM | POA: Diagnosis not present

## 2016-04-23 ENCOUNTER — Other Ambulatory Visit: Payer: Self-pay | Admitting: Family Medicine

## 2016-06-07 ENCOUNTER — Ambulatory Visit: Payer: BLUE CROSS/BLUE SHIELD | Admitting: Family Medicine

## 2016-07-18 ENCOUNTER — Encounter: Payer: Self-pay | Admitting: Family Medicine

## 2016-07-18 ENCOUNTER — Ambulatory Visit (INDEPENDENT_AMBULATORY_CARE_PROVIDER_SITE_OTHER): Payer: Medicare Other | Admitting: Family Medicine

## 2016-07-18 VITALS — BP 118/70 | Temp 98.2°F | Ht 61.25 in | Wt 125.0 lb

## 2016-07-18 DIAGNOSIS — Z23 Encounter for immunization: Secondary | ICD-10-CM

## 2016-07-18 DIAGNOSIS — Z8542 Personal history of malignant neoplasm of other parts of uterus: Secondary | ICD-10-CM

## 2016-07-18 DIAGNOSIS — M8580 Other specified disorders of bone density and structure, unspecified site: Secondary | ICD-10-CM

## 2016-07-18 DIAGNOSIS — E785 Hyperlipidemia, unspecified: Secondary | ICD-10-CM

## 2016-07-18 DIAGNOSIS — F3342 Major depressive disorder, recurrent, in full remission: Secondary | ICD-10-CM

## 2016-07-18 DIAGNOSIS — J4530 Mild persistent asthma, uncomplicated: Secondary | ICD-10-CM | POA: Diagnosis not present

## 2016-07-18 MED ORDER — TRAZODONE HCL 50 MG PO TABS: 50.0000 mg | ORAL_TABLET | Freq: Every day | ORAL | 3 refills | Status: DC

## 2016-07-18 NOTE — Progress Notes (Signed)
Pre visit review using our clinic review tool, if applicable. No additional management support is needed unless otherwise documented below in the visit note. 

## 2016-07-18 NOTE — Progress Notes (Signed)
HPI:  Follow up:  Asthma and Allergens: -dx in 1998 -never hospitalized  Hyperlipidemia/Prediabetes: -labs 12/2015 ok -no regular aerobic exercise; poor diet -on crestor 5mg  for a long time -reports lifelong low blood pressure and occ lightheaded when stand suddenly if does not eat beef?  Depression/Insomnia: -for several years but now resolved - on zoloft in the past and worked well -takes low dose of trazadone 3 times per week chronically for poor sleep - but now taking nightly and requests new refill - rxd by prior PCP "forever" -denies: SI, depression, hx of hospitalization  Eczema: -sees skin surgery center, uses clobetasol sol  Osteopenia: -sees gyn for this -hx uterine ca -taking Evista since hysterectomy - always rxed by gyn -did dexa with gyn, but she did not remember this  ROS: See pertinent positives and negatives per HPI.  Past Medical History:  Diagnosis Date  . Asthma   . Chicken pox   . Depression   . Eczema   . Endometrial cancer ([place])   . Environmental allergies   . Glaucoma   . Hyperglycemia   . Hyperlipidemia   . Insomnia   . Osteopenia     Past Surgical History:  Procedure Laterality Date  . 2004    . ABDOMINAL HYSTERECTOMY    . BREAST SURGERY      Family History  Problem Relation Age of Onset  . Lung cancer    . Hyperlipidemia      Social History   Social History  . Marital status: Single    Spouse name: N/A  . Number of children: N/A  . Years of education: N/A   Social History Main Topics  . Smoking status: Never Smoker  . Smokeless tobacco: None  . Alcohol use Yes     Comment: 1 drink rarely  . Drug use: No  . Sexual activity: Not Asked   Other Topics Concern  . None   Social History Narrative   Work or School: cares for grandchild      Home Situation: lives alone      Spiritual Beliefs: Christian, no church currently      Lifestyle: no regular exercise; diet is poor           Current Outpatient  Prescriptions:  .  Brimonidine Tartrate-Timolol (COMBIGAN OP), Apply 1 drop to eye 2 (two) times daily., Disp: , Rfl:  .  clobetasol (TEMOVATE) 0.05 % external solution, Apply 1 application topically as needed., Disp: , Rfl: 0 .  mometasone (ASMANEX) 220 MCG/INH inhaler, Inhale 2 puffs into the lungs 2 (two) times daily., Disp: 3 Inhaler, Rfl: 3 .  montelukast (SINGULAIR) 10 MG tablet, Take 1 tablet (10 mg total) by mouth at bedtime., Disp: 90 tablet, Rfl: 3 .  Pseudoephedrine-Ibuprofen (ADVIL COLD/SINUS PO), Take by mouth as needed., Disp: , Rfl:  .  raloxifene (EVISTA) 60 MG tablet, Take 60 mg by mouth daily., Disp: , Rfl:  .  rosuvastatin (CRESTOR) 5 MG tablet, Take 1 tablet (5 mg total) by mouth daily., Disp: 90 tablet, Rfl: 3 .  sertraline (ZOLOFT) 50 MG tablet, Take 50 mg by mouth daily., Disp: , Rfl:  .  traZODone (DESYREL) 50 MG tablet, Take 1 tablet (50 mg total) by mouth at bedtime., Disp: 90 tablet, Rfl: 3  EXAM:  Vitals:   07/18/16 1014  BP: 118/70  Temp: 98.2 F (36.8 C)    Body mass index is 23.43 kg/m.  GENERAL: vitals reviewed and listed above, alert, oriented, appears well hydrated  and in no acute distress  HEENT: atraumatic, conjunttiva clear, no obvious abnormalities on inspection of external nose and ears  NECK: no obvious masses on inspection  LUNGS: clear to auscultation bilaterally, no wheezes, rales or rhonchi, good air movement  CV: HRRR, no peripheral edema  MS: moves all extremities without noticeable abnormality  PSYCH: pleasant and cooperative, no obvious depression or anxiety  ASSESSMENT AND PLAN:  Discussed the following assessment and plan:  Asthma, mild persistent, uncomplicated  Hyperlipemia  Recurrent major depressive disorder, in full remission ([place])  Osteopenia  History of uterine cancer  -Patient advised to return or notify a doctor immediately if symptoms worsen or persist or new concerns arise.  Patient Instructions  BEFORE  YOU LEAVE: -follow up: Medicare exam in February 2018; will plan to do labs then -flu shot  Stop the multivitamin.  Vit D3 [phone] IU daily. Adequate dietary intake of calcium (1200mg ).   We recommend the following healthy lifestyle for LIFE: 1) Small portions.   Tip: eat off of a salad plate instead of a dinner plate.  Tip: It is ok to feel hungry after a meal - that likely means you ate an appropriate portion.  Tip: if you need more or a snack choose fruits, veggies and/or a handful of nuts or seeds.  2) Eat a healthy clean diet.  * Tip: Avoid (less then 1 serving per week): processed foods, sweets, sweetened drinks, white starches (rice, flour, bread, potatoes, pasta, etc), red meat, fast foods, butter  *Tip: CHOOSE instead   * 5-9 servings per day of fresh or frozen fruits and vegetables (but not corn, potatoes, bananas, canned or dried fruit)   *nuts and seeds, beans   *olives and olive oil   *small portions of lean meats such as fish and white chicken    *small portions of whole grains  3)Get at least 150 minutes of sweaty aerobic exercise per week.  4)Reduce stress - consider counseling, meditation and relaxation to balance other aspects of your life.     Colin Benton R., DO

## 2016-07-18 NOTE — Patient Instructions (Signed)
BEFORE YOU LEAVE: -follow up: Medicare exam in February 2018; will plan to do labs then -flu shot  Stop the multivitamin.  Vit D3 [phone] daily. Adequate dietary intake of calcium (1200mg ).   We recommend the following healthy lifestyle for LIFE: 1) Small portions.   Tip: eat off of a salad plate instead of a dinner plate.  Tip: It is ok to feel hungry after a meal - that likely means you ate an appropriate portion.  Tip: if you need more or a snack choose fruits, veggies and/or a handful of nuts or seeds.  2) Eat a healthy clean diet.  * Tip: Avoid (less then 1 serving per week): processed foods, sweets, sweetened drinks, white starches (rice, flour, bread, potatoes, pasta, etc), red meat, fast foods, butter  *Tip: CHOOSE instead   * 5-9 servings per day of fresh or frozen fruits and vegetables (but not corn, potatoes, bananas, canned or dried fruit)   *nuts and seeds, beans   *olives and olive oil   *small portions of lean meats such as fish and white chicken    *small portions of whole grains  3)Get at least 150 minutes of sweaty aerobic exercise per week.  4)Reduce stress - consider counseling, meditation and relaxation to balance other aspects of your life.

## 2016-08-03 ENCOUNTER — Other Ambulatory Visit: Payer: Self-pay | Admitting: Family Medicine

## 2016-09-21 DIAGNOSIS — H18713 Corneal ectasia, bilateral: Secondary | ICD-10-CM | POA: Diagnosis not present

## 2016-09-21 DIAGNOSIS — H2513 Age-related nuclear cataract, bilateral: Secondary | ICD-10-CM | POA: Diagnosis not present

## 2016-09-21 DIAGNOSIS — H52213 Irregular astigmatism, bilateral: Secondary | ICD-10-CM | POA: Diagnosis not present

## 2016-09-21 DIAGNOSIS — H401131 Primary open-angle glaucoma, bilateral, mild stage: Secondary | ICD-10-CM | POA: Diagnosis not present

## 2016-10-12 DIAGNOSIS — Z0000 Encounter for general adult medical examination without abnormal findings: Secondary | ICD-10-CM | POA: Diagnosis not present

## 2016-12-08 ENCOUNTER — Ambulatory Visit (INDEPENDENT_AMBULATORY_CARE_PROVIDER_SITE_OTHER): Payer: Medicare Other | Admitting: Family Medicine

## 2016-12-08 ENCOUNTER — Encounter: Payer: Self-pay | Admitting: Family Medicine

## 2016-12-08 VITALS — BP 122/80 | HR 108 | Temp 99.7°F | Ht 61.25 in | Wt 121.6 lb

## 2016-12-08 DIAGNOSIS — J4541 Moderate persistent asthma with (acute) exacerbation: Secondary | ICD-10-CM | POA: Diagnosis not present

## 2016-12-08 DIAGNOSIS — J111 Influenza due to unidentified influenza virus with other respiratory manifestations: Secondary | ICD-10-CM

## 2016-12-08 MED ORDER — BENZONATATE 100 MG PO CAPS: 100.0000 mg | ORAL_CAPSULE | Freq: Two times a day (BID) | ORAL | 0 refills | Status: DC | PRN

## 2016-12-08 MED ORDER — AZITHROMYCIN 250 MG PO TABS: ORAL_TABLET | ORAL | 0 refills | Status: DC

## 2016-12-08 MED ORDER — PREDNISONE 20 MG PO TABS: 40.0000 mg | ORAL_TABLET | Freq: Every day | ORAL | 0 refills | Status: DC

## 2016-12-08 MED ORDER — OSELTAMIVIR PHOSPHATE 75 MG PO CAPS: 75.0000 mg | ORAL_CAPSULE | Freq: Two times a day (BID) | ORAL | 0 refills | Status: DC

## 2016-12-08 NOTE — Patient Instructions (Signed)
Go get the medications and start them as soon as possible: Tamiflu for the flu Antibiotic (azithromycin) and prednisone for the asthma. Tessalon for the cough as needed. Use your inhalers as needed.  Seek care immediately if  trouble breathing despite your inhalers, can tolerate oral fluids or feel worse.    Influenza, Adult Influenza, more commonly known as "the flu," is a viral infection that primarily affects the respiratory tract. The respiratory tract includes organs that help you breathe, such as the lungs, nose, and throat. The flu causes many common cold symptoms, as well as a high fever and body aches. The flu spreads easily from person to person (is contagious). Getting a flu shot (influenza vaccination) every year is the best way to prevent influenza. What are the causes? Influenza is caused by a virus. You can catch the virus by:  Breathing in droplets from an infected person's cough or sneeze.  Touching something that was recently contaminated with the virus and then touching your mouth, nose, or eyes. What increases the risk? The following factors may make you more likely to get the flu:  Not cleaning your hands frequently with soap and water or alcohol-based hand sanitizer.  Having close contact with many people during cold and flu season.  Touching your mouth, eyes, or nose without washing or sanitizing your hands first.  Not drinking enough fluids or not eating a healthy diet.  Not getting enough sleep or exercise.  Being under a high amount of stress.  Not getting a yearly (annual) flu shot. You may be at a higher risk of complications from the flu, such as a severe lung infection (pneumonia), if you:  Are over the age of 76.  Are pregnant.  Have a weakened disease-fighting system (immune system). You may have a weakened immune system if you:  Have HIV or AIDS.  Are undergoing chemotherapy.  Aretaking medicines that reduce the activity of (suppress)  the immune system.  Have a long-term (chronic) illness, such as heart disease, kidney disease, diabetes, or lung disease.  Have a liver disorder.  Are obese.  Have anemia. What are the signs or symptoms? Symptoms of this condition typically last 4-10 days and may include:  Fever.  Chills.  Headache, body aches, or muscle aches.  Sore throat.  Cough.  Runny or congested nose.  Chest discomfort and cough.  Poor appetite.  Weakness or tiredness (fatigue).  Dizziness.  Nausea or vomiting. How is this diagnosed? This condition may be diagnosed based on your medical history and a physical exam. Your health care provider may do a nose or throat swab test to confirm the diagnosis. How is this treated? If influenza is detected early, you can be treated with antiviral medicine that can reduce the length of your illness and the severity of your symptoms. This medicine may be given by mouth (orally) or through an IV tube that is inserted in one of your veins. The goal of treatment is to relieve symptoms by taking care of yourself at home. This may include taking over-the-counter medicines, drinking plenty of fluids, and adding humidity to the air in your home. In some cases, influenza goes away on its own. Severe influenza or complications from influenza may be treated in a hospital. Follow these instructions at home:  Take over-the-counter and prescription medicines only as told by your health care provider.  Use a cool mist humidifier to add humidity to the air in your home. This can make breathing easier.  Rest  as needed.  Drink enough fluid to keep your urine clear or pale yellow.  Cover your mouth and nose when you cough or sneeze.  Wash your hands with soap and water often, especially after you cough or sneeze. If soap and water are not available, use hand sanitizer.  Stay home from work or school as told by your health care provider. Unless you are visiting your health  care provider, try to avoid leaving home until your fever has been gone for 24 hours without the use of medicine.  Keep all follow-up visits as told by your health care provider. This is important. How is this prevented?  Getting an annual flu shot is the best way to avoid getting the flu. You may get the flu shot in late summer, fall, or winter. Ask your health care provider when you should get your flu shot.  Wash your hands often or use hand sanitizer often.  Avoid contact with people who are sick during cold and flu season.  Eat a healthy diet, drink plenty of fluids, get enough sleep, and exercise regularly. Contact a health care provider if:  You develop new symptoms.  You have:  Chest pain.  Diarrhea.  A fever.  Your cough gets worse.  You produce more mucus.  You feel nauseous or you vomit. Get help right away if:  You develop shortness of breath or difficulty breathing.  Your skin or nails turn a bluish color.  You have severe pain or stiffness in your neck.  You develop a sudden headache or sudden pain in your face or ear.  You cannot stop vomiting. This information is not intended to replace advice given to you by your health care provider. Make sure you discuss any questions you have with your health care provider. Document Released: 10/21/2000 Document Revised: 03/31/2016 Document Reviewed: 08/18/2015 Elsevier Interactive Patient Education  2017 [employer].

## 2016-12-08 NOTE — Progress Notes (Signed)
Pre visit review using our clinic review tool, if applicable. No additional management support is needed unless otherwise documented below in the visit note. 

## 2016-12-08 NOTE — Progress Notes (Signed)
HPI:  Flu like symptoms: -started: yesterday -symptoms:nasal congestion, fever mild, sore throat, cough productive of thick mucus, wheezing, body aches, headache, nausea, vomited once yesterday -denies:SOB, rash, inability to tolerate fluids -has tried: asa -sick contacts/travel/risks: no reported flu, strep or tick exposure -Hx of: asthma  ROS: See pertinent positives and negatives per HPI.  Past Medical History:  Diagnosis Date  . Asthma   . Chicken pox   . Depression   . Eczema   . Endometrial cancer ([place])   . Environmental allergies   . Glaucoma   . Hyperglycemia   . Hyperlipidemia   . Insomnia   . Osteopenia     Past Surgical History:  Procedure Laterality Date  . 2004    . ABDOMINAL HYSTERECTOMY    . BREAST SURGERY      Family History  Problem Relation Age of Onset  . Lung cancer    . Hyperlipidemia      Social History   Social History  . Marital status: Single    Spouse name: N/A  . Number of children: N/A  . Years of education: N/A   Social History Main Topics  . Smoking status: Never Smoker  . Smokeless tobacco: Never Used  . Alcohol use Yes     Comment: 1 drink rarely  . Drug use: No  . Sexual activity: Not Asked   Other Topics Concern  . None   Social History Narrative   Work or School: cares for grandchild      Home Situation: lives alone      Spiritual Beliefs: Christian, no church currently      Lifestyle: no regular exercise; diet is poor           Current Outpatient Prescriptions:  .  ASMANEX 120 METERED DOSES 220 MCG/INH inhaler, USE 2 INHALATIONS TWICE A DAY. EXPIRES 45 DAYS, Disp: 3 Inhaler, Rfl: 1 .  Brimonidine Tartrate-Timolol (COMBIGAN OP), Apply 1 drop to eye 2 (two) times daily., Disp: , Rfl:  .  clobetasol (TEMOVATE) 0.05 % external solution, Apply 1 application topically as needed., Disp: , Rfl: 0 .  montelukast (SINGULAIR) 10 MG tablet, TAKE 1 TABLET AT BEDTIME, Disp: 90 tablet, Rfl: 1 .   Pseudoephedrine-Ibuprofen (ADVIL COLD/SINUS PO), Take by mouth as needed., Disp: , Rfl:  .  raloxifene (EVISTA) 60 MG tablet, Take 60 mg by mouth daily., Disp: , Rfl:  .  rosuvastatin (CRESTOR) 5 MG tablet, TAKE 1 TABLET DAILY, Disp: 90 tablet, Rfl: 1 .  sertraline (ZOLOFT) 50 MG tablet, Take 50 mg by mouth daily., Disp: , Rfl:  .  traZODone (DESYREL) 50 MG tablet, Take 1 tablet (50 mg total) by mouth at bedtime., Disp: 90 tablet, Rfl: 3 .  azithromycin (ZITHROMAX) 250 MG tablet, 2 tabs day 1, then one tab daily, Disp: 6 tablet, Rfl: 0 .  benzonatate (TESSALON) 100 MG capsule, Take 1 capsule (100 mg total) by mouth 2 (two) times daily as needed for cough., Disp: 20 capsule, Rfl: 0 .  oseltamivir (TAMIFLU) 75 MG capsule, Take 1 capsule (75 mg total) by mouth 2 (two) times daily., Disp: 10 capsule, Rfl: 0 .  predniSONE (DELTASONE) 20 MG tablet, Take 2 tablets (40 mg total) by mouth daily with breakfast., Disp: 8 tablet, Rfl: 0  EXAM:  Vitals:   12/08/16 0954  BP: 122/80  Pulse: (!) 108  Temp: 99.7 F (37.6 C)    Body mass index is 22.79 kg/m.  GENERAL: vitals reviewed and listed above, alert,  oriented, appears well hydrated and in no acute distress  HEENT: atraumatic, conjunttiva clear, no obvious abnormalities on inspection of external nose and ears, normal appearance of ear canals and TMs, clear nasal congestion, mild post oropharyngeal erythema with PND, no tonsillar edema or exudate, no sinus TTP  NECK: no obvious masses on inspection  LUNGS: scattered wheeze, no rhonchi, no signs resp distress, O2 sats 95 % on room air  CV: HRRR, no peripheral edema  MS: moves all extremities without noticeable abnormality  PSYCH: pleasant and cooperative, no obvious depression or anxiety  ASSESSMENT AND PLAN:  Discussed the following assessment and plan:  Influenza  Moderate persistent asthma with acute exacerbation   We discussed potential etiologies, with influenza and asthma  exacerbation being most likely. Opted for tamiflu, abx and prednisone given hx and exam and her concerns after discussion risks/benefits. Tessalon for cough as needed. We discussed treatment side effects, likely course, transmission, potential complications, return and emergency precuations and signs of developing a serious illness. -of course, we advised to return or notify a doctor immediately if symptoms worsen or persist or new concerns arise.    Patient Instructions  Go get the medications and start them as soon as possible: Tamiflu for the flu Antibiotic (azithromycin) and prednisone for the asthma. Tessalon for the cough as needed. Use your inhalers as needed.  Seek care immediately if  trouble breathing despite your inhalers, can tolerate oral fluids or feel worse.    Influenza, Adult Influenza, more commonly known as "the flu," is a viral infection that primarily affects the respiratory tract. The respiratory tract includes organs that help you breathe, such as the lungs, nose, and throat. The flu causes many common cold symptoms, as well as a high fever and body aches. The flu spreads easily from person to person (is contagious). Getting a flu shot (influenza vaccination) every year is the best way to prevent influenza. What are the causes? Influenza is caused by a virus. You can catch the virus by:  Breathing in droplets from an infected person's cough or sneeze.  Touching something that was recently contaminated with the virus and then touching your mouth, nose, or eyes. What increases the risk? The following factors may make you more likely to get the flu:  Not cleaning your hands frequently with soap and water or alcohol-based hand sanitizer.  Having close contact with many people during cold and flu season.  Touching your mouth, eyes, or nose without washing or sanitizing your hands first.  Not drinking enough fluids or not eating a healthy diet.  Not getting enough  sleep or exercise.  Being under a high amount of stress.  Not getting a yearly (annual) flu shot. You may be at a higher risk of complications from the flu, such as a severe lung infection (pneumonia), if you:  Are over the age of 22.  Are pregnant.  Have a weakened disease-fighting system (immune system). You may have a weakened immune system if you:  Have HIV or AIDS.  Are undergoing chemotherapy.  Aretaking medicines that reduce the activity of (suppress) the immune system.  Have a long-term (chronic) illness, such as heart disease, kidney disease, diabetes, or lung disease.  Have a liver disorder.  Are obese.  Have anemia. What are the signs or symptoms? Symptoms of this condition typically last 4-10 days and may include:  Fever.  Chills.  Headache, body aches, or muscle aches.  Sore throat.  Cough.  Runny or congested nose.  Chest discomfort and cough.  Poor appetite.  Weakness or tiredness (fatigue).  Dizziness.  Nausea or vomiting. How is this diagnosed? This condition may be diagnosed based on your medical history and a physical exam. Your health care provider may do a nose or throat swab test to confirm the diagnosis. How is this treated? If influenza is detected early, you can be treated with antiviral medicine that can reduce the length of your illness and the severity of your symptoms. This medicine may be given by mouth (orally) or through an IV tube that is inserted in one of your veins. The goal of treatment is to relieve symptoms by taking care of yourself at home. This may include taking over-the-counter medicines, drinking plenty of fluids, and adding humidity to the air in your home. In some cases, influenza goes away on its own. Severe influenza or complications from influenza may be treated in a hospital. Follow these instructions at home:  Take over-the-counter and prescription medicines only as told by your health care provider.  Use  a cool mist humidifier to add humidity to the air in your home. This can make breathing easier.  Rest as needed.  Drink enough fluid to keep your urine clear or pale yellow.  Cover your mouth and nose when you cough or sneeze.  Wash your hands with soap and water often, especially after you cough or sneeze. If soap and water are not available, use hand sanitizer.  Stay home from work or school as told by your health care provider. Unless you are visiting your health care provider, try to avoid leaving home until your fever has been gone for 24 hours without the use of medicine.  Keep all follow-up visits as told by your health care provider. This is important. How is this prevented?  Getting an annual flu shot is the best way to avoid getting the flu. You may get the flu shot in late summer, fall, or winter. Ask your health care provider when you should get your flu shot.  Wash your hands often or use hand sanitizer often.  Avoid contact with people who are sick during cold and flu season.  Eat a healthy diet, drink plenty of fluids, get enough sleep, and exercise regularly. Contact a health care provider if:  You develop new symptoms.  You have:  Chest pain.  Diarrhea.  A fever.  Your cough gets worse.  You produce more mucus.  You feel nauseous or you vomit. Get help right away if:  You develop shortness of breath or difficulty breathing.  Your skin or nails turn a bluish color.  You have severe pain or stiffness in your neck.  You develop a sudden headache or sudden pain in your face or ear.  You cannot stop vomiting. This information is not intended to replace advice given to you by your health care provider. Make sure you discuss any questions you have with your health care provider. Document Released: 10/21/2000 Document Revised: 03/31/2016 Document Reviewed: 08/18/2015 Elsevier Interactive Patient Education  2017 [place].,  DO

## 2016-12-15 ENCOUNTER — Encounter: Payer: Self-pay | Admitting: Family Medicine

## 2016-12-20 ENCOUNTER — Other Ambulatory Visit: Payer: Self-pay | Admitting: Family Medicine

## 2016-12-20 ENCOUNTER — Encounter: Payer: Medicare Other | Admitting: Family Medicine

## 2016-12-21 NOTE — Telephone Encounter (Signed)
This was refilled for 1 year in September - taking nightly. Can you check why we received this refill request and refill for pt if needed.

## 2016-12-21 NOTE — Progress Notes (Signed)
Subjective:   Kelly Petersen is a 71 y.o. female who presents for Medicare Annual (Subsequent) preventive examination. HRA assessment completed during this visit with Kelly Petersen  The Patient was informed that the wellness visit is to identify future health risk and educate and initiate measures that can reduce risk for increased disease through the lifespan.    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 good  Social History   Social History  . Marital status: Single    Spouse name: N/A  . Number of children: N/A  . Years of education: N/A   Occupational History  . Not on file.   Social History Main Topics  . Smoking status: Never Smoker  . Smokeless tobacco: Never Used  . Alcohol use Yes     Comment: 1 drink rarely  . Drug use: No  . Sexual activity: Not on file   Other Topics Concern  . Not on file   Social History Narrative   Work or School: cares for grandchild      Home Situation: lives alone      Spiritual Beliefs: Christian, no church currently      Lifestyle: no regular exercise; diet is poor          Update:  Tobacco: never smoked  1st spouse smoked;  It has been 30 years since she has been around 2nd hand smoke   How many drinks do you have per week? rare  Medications reviewed and no issues   BMI: 22   Diet;   Breakfast; has cheerios with fruit English muffin and coffee Cut orange juice  Was Hypoglycemic when young; happened when she was exercising; when she gained some weight and ate more this subsided Lunch; chic filet; mcdonalds; grilled ham and cheese Supper; may not eat  dinner but snack; pretzels or ice cream Loves pepsi and coke   Nutritional counseling given: discussed options to a healthier diet.   Exercise;   Carries her 22 lbs poodle down the steps in home States she is on her feet a lot;  Does her own house work;  To get fit bit soon and will be able to track steps    HOME SAFETY;  Has not made new friends as yet due to being busy Fall hx; not significant  Gait: normal  Given education on "Fall Prevention in the Home" for more safety tips the patient can apply as appropriate.  Long term goal is to "age in place" or undecided    Hearing Screening Comments: 2000hz  today but just getting over the flu  Vision Screening Comments: Vision at [facility] under treatment scleral lenses and has to take out with a suction  They are hard lenses and different curvatures   Ophthalmology exam Note in epic; Kelly Petersen; primary open angle glaucoma Beginning cataracts; radiokerentoatomy;  Flattened out and now has to wear special contact lenses    Mental Health:  Any emotional problems? Anxious, depressed, irritable, sad or blue? no Denies feeling depressed or hopeless; voices pleasure in daily life How many  social activities have you been engaged in within the last 2 weeks? no  Pain: no   Asked about urinary frequency or other issues with bowels  Urine is darker toward brownish at times Encouraged more fluids and to make an apt with Kelly Petersen   Cognitive;  Manages checkbook, medications; no failures of task Ad8 score reviewed for issues;  Issues making decisions; no  Less interest in hobbies / activities" no  Repeats questions, stories; family complaining: NO  Trouble using ordinary gadgets; microwave; computer: no  Forgets the month or year: no  Mismanaging finances: no  Missing apt: no but does write them down  Daily problems with thinking of memory NO Ad8 score is 0   Any dizziness when standing up?  no  Mobilization and  Functional losses from last year to this year?  no   Advanced Directive addressed; Completed  There are no preventive care reminders to display for this patient.    Colonoscopy; 12/2023  Mammogram: 01/2016/ repeat 01/2017 Dexa/ per Kelly Petersen; taking Evista x 16 years  Last dexa 11/2014 / -0.7 and -2.0 GYN apt at the end of the month ; will discuss if GYN is following dexa;  Encouraged strength building exercise  Had lifeline screening: osteopenia   EKG 08/2014  The lifeline said something about plaque  Educated on possible carotid u/s She is looking for the summary of her life screening   PAP: educated regarding the need for GYN exam;  Kelly Petersen, Kelly Petersen; [facility]  Bowel or bladder issues;  Leakage with coughing Educated kagel exercises    Immunizations Due: (Vaccines reviewed and educated regarding any overdue)      Objective:     Vitals: BP 98/60   Pulse 80   Ht 5\' 3"  (1.6 m)   Wt 124 lb (56.2 kg)   SpO2 98%   BMI 21.97 kg/m   Body mass index is 21.97 kg/m.   Tobacco History  Smoking Status  . Never Smoker  Smokeless Tobacco  . Never Used     Counseling given: Yes   Past Medical History:  Diagnosis Date  . Asthma   . Chicken pox   . Depression   . Eczema   . Endometrial cancer ([place])   . Environmental allergies   . Glaucoma   . Hyperglycemia   . Hyperlipidemia   . Insomnia   . Osteopenia    Past Surgical History:  Procedure Laterality Date  . 2004    . ABDOMINAL HYSTERECTOMY    . BREAST SURGERY     Family History  Problem Relation Age of Onset  . Lung cancer    . Hyperlipidemia     History  Sexual Activity  . Sexual activity: Not on file    Outpatient Encounter Prescriptions as of 12/22/2016  Medication Sig  . ASMANEX 120 METERED DOSES 220 MCG/INH inhaler USE 2 INHALATIONS TWICE A DAY. EXPIRES 45 DAYS  . Brimonidine Tartrate-Timolol (COMBIGAN OP) Apply 1 drop to eye 2 (two) times daily.  . clobetasol (TEMOVATE) 0.05 % external  solution Apply 1 application topically as needed.  . montelukast (SINGULAIR) 10 MG tablet TAKE 1 TABLET AT BEDTIME  . Pseudoephedrine-Ibuprofen (ADVIL COLD/SINUS PO) Take by mouth as needed.  . raloxifene (EVISTA) 60 MG tablet Take 60 mg by mouth daily.  . rosuvastatin (CRESTOR) 5 MG tablet TAKE 1 TABLET DAILY  . sertraline (ZOLOFT) 50 MG tablet Take 50 mg by mouth daily.  . traZODone (DESYREL) 50 MG tablet Take  1 tablet (50 mg total) by mouth at bedtime.  . benzonatate (TESSALON) 100 MG capsule Take 1 capsule (100 mg total) by mouth 2 (two) times daily as needed for cough.  . [DISCONTINUED] azithromycin (ZITHROMAX) 250 MG tablet 2 tabs day 1, then one tab daily  . [DISCONTINUED] oseltamivir (TAMIFLU) 75 MG capsule Take 1 capsule (75 mg total) by mouth 2 (two) times daily.  . [DISCONTINUED] predniSONE (DELTASONE) 20 MG tablet Take 2 tablets (40 mg total) by mouth daily with breakfast.   No facility-administered encounter medications on file as of 12/22/2016.     Activities of Daily Living In your present state of health, do you have any difficulty performing the following activities: 12/22/2016  Hearing? N  Vision? Y  Some recent data might be hidden    Patient Care Team: Lucretia Kern, DO as PCP - General (Family Medicine) Sanjuana Kava, MD as Referring Physician (Obstetrics and Gynecology) Hillery Jacks, MD as Referring Physician (Dermatology)    Assessment:     Exercise Activities and Dietary recommendations Discussed A1c as she was told she was pre-diabetic during this screening. Given information and encouraged to walk briskly  X 3 to 5 days a week. States she does walk briskly when shopping  Triglycerides high; loves fast food but did discuss improving her diet as what we eat today dictates our health tomorrow    Goals    . Eat more fruits and vegetables          Pick up vegetables farmer's  Snack on them Eat more fruit Cut up some vegetables one day and have them ready  in the frig Not eat as much fast foods or make different choices one day a week        Fall Risk Fall Risk  12/22/2016 08/19/2015 08/08/2014  Falls in the past year? (No Data) No Yes  Number falls in past yr: - - 1  Injury with Fall? - - Yes   Depression Screen PHQ 2/9 Scores 12/22/2016 08/19/2015 08/08/2014  PHQ - 2 Score 0 0 0     Cognitive Function no memory issues Ad8 score 0         Immunization History  Administered Date(s) Administered  . Influenza Split 06/27/2013  . Influenza, High Dose Seasonal PF 08/08/2014, 08/03/2015, 07/18/2016  . Pneumococcal Conjugate-13 08/08/2014  . Pneumococcal Polysaccharide-23 05/15/2003, 11/07/2008, 12/09/2015  . Td 05/11/2003  . Tdap 11/07/2009  . Zoster 11/15/2011   Screening Tests Health Maintenance  Topic Date Due  . MAMMOGRAM  01/26/2018  . TETANUS/TDAP  11/08/2019  . COLONOSCOPY  12/24/2023  . INFLUENZA VACCINE  Completed  . DEXA SCAN  Completed  . ZOSTAVAX  Completed  . Hepatitis C Screening  Addressed  . PNA vac Low Risk Adult  Completed      Plan:    PCP Notes  Health Maintenance Bone density: will see Kelly Petersen at the end of the month and will determine if she is following bone density or Kelly Petersen  Dexa can could be repeated   Abnormal Screens  Was told by life screen that she had "Pre-diabetes"  She was educated and given numbers for diabetes. FBS < 99   Referrals   Patient concerns; To fup with apt with Kelly Petersen for the following Get A1c due to life screen Check ua which is turning darker (dark yellow or brownish)  Also may discuss carotid doffler exam due to  Life screening  [place] on adrenal glad followed  [place] once a [place]  Discuss allergies in general due to frequents illness since moving to [facility]    Nurse Concerns; fup on ua  And other States she is going out of town in April and wanted to check on dates prior to making an apt.  Next PCP apt   During the course of the visit the  patient was educated and counseled about the following appropriate screening and preventive services:   Vaccines to include Pneumoccal, Influenza, Hepatitis B, Td, Zostavax, HCV  Electrocardiogram  Cardiovascular Disease no issues  Colorectal cancer screening due 12/2023  Bone density screening to fup with GYN this month;   Diabetes screening  Glaucoma screening   Mammography/ to schedule March of this year   Followed by GYN  Nutrition counseling / diet is not good; fast food etc  Discussed ways to make it more healthy   Patient Instructions (the written plan) was given to the patient.   Wynetta Fines, RN  12/22/2016

## 2016-12-22 ENCOUNTER — Ambulatory Visit (INDEPENDENT_AMBULATORY_CARE_PROVIDER_SITE_OTHER): Payer: Medicare Other

## 2016-12-22 VITALS — BP 98/60 | HR 80 | Ht 63.0 in | Wt 124.0 lb

## 2016-12-22 DIAGNOSIS — Z0000 Encounter for general adult medical examination without abnormal findings: Secondary | ICD-10-CM

## 2016-12-22 NOTE — Telephone Encounter (Signed)
I left a message for the pt to return my call. 

## 2016-12-22 NOTE — Progress Notes (Signed)
Would advise appt sooner then April for concerns if new concerns. Thanks!

## 2016-12-22 NOTE — Patient Instructions (Addendum)
Kelly Petersen , Thank you for taking time to come for your Medicare Wellness Visit. I appreciate your ongoing commitment to your health goals. Please review the following plan we discussed and let me know if I can assist you in the future.   Will check with Dr. Alwyn Pea to see if she is following your bone density One is due if you would like to repeat.  [email or web address] [email or web address] to learn more   Educated regarding prediabetes and numbers;  A1c ranges from 5.8 to 6.5 or fasting Blood sugar > 115 -126; (126 is diabetic)   Risk: >45yo; family hx; overweight or obese; African American; Hispanic; Latino; American [place] American; [place]; history of diabetes when pregnant; or birth to a baby weighing over 9 lbs. Being less physically active than 30 minutes; 3 times a week;   Prevention; Losing a modest 7 to 8 lbs; If over 200 lbs; 10 to 14 lbs;  Choose healthier foods; colorful veggies; fish or lean meats; drinks water Reduce portion size Start exercising; 30 minutes of fast walking x 30 minutes per day/ 60 min for weight loss    Will make apt with Dr. Maudie Mercury Get A1c due to life screen Check ua which is turning darker  Also may discuss carotid doffler exam due to  Life screening  [place] on adrenal glad [place] in the [place] once a year  Discuss allergies in general due to frequents illness since moving to [place]      These are the goals we discussed: Goals    . Eat more fruits and vegetables          Pick up vegetables farmer's  Snack on them Eat more fruit Cut up some vegetables one day and have them ready in the frig Not eat as much fast foods or make different choices one day a week         This is a list of the screening recommended for you and due dates:  Health Maintenance  Topic Date Due  . Mammogram  01/26/2018  . Tetanus Vaccine  11/08/2019  . Colon Cancer Screening  12/24/2023  . Flu Shot  Completed  . DEXA scan (bone density measurement)  Completed  .  Shingles Vaccine  Completed  .  Hepatitis C: One time screening is recommended by Center for Disease Control  (CDC) for  adults born from [birth year] through 1967.   Addressed  . Pneumonia vaccines  Completed       Bone Densitometry Introduction Bone densitometry is an imaging test that uses a special X-ray to measure the amount of calcium and other minerals in your bones (bone density). This test is also known as a bone mineral density test or dual-energy X-ray absorptiometry (DXA). The test can measure bone density at your hip and your spine. It is similar to having a regular X-ray. You may have this test to:  Diagnose a condition that causes weak or thin bones (osteoporosis).  Predict your risk of a broken bone (fracture).  Determine how well osteoporosis treatment is working. Tell a health care provider about:  Any allergies you have.  All medicines you are taking, including vitamins, herbs, eye drops, creams, and over-the-counter medicines.  Any problems you or family members have had with anesthetic medicines.  Any blood disorders you have.  Any surgeries you have had.  Any medical conditions you have.  Possibility of pregnancy.  Any other medical test you had within the previous 14 days that used  contrast material. What are the risks? Generally, this is a safe procedure. However, problems can occur and may include the following:  This test exposes you to a very small amount of radiation.  The risks of radiation exposure may be greater to unborn children. What happens before the procedure?  Do not take any calcium supplements for 24 hours before having the test. You can otherwise eat and drink what you usually do.  Take off all metal jewelry, eyeglasses, dental appliances, and any other metal objects. What happens during the procedure?  You may lie on an exam table. There will be an X-ray generator below you and an imaging device above you.  Other devices, such as  boxes or braces, may be used to position your body properly for the scan.  You will need to lie still while the machine slowly scans your body.  The images will show up on a computer monitor. What happens after the procedure? You may need more testing at a later time. This information is not intended to replace advice given to you by your health care provider. Make sure you discuss any questions you have with your health care provider. Document Released: 11/15/2004 Document Revised: 03/31/2016 Document Reviewed: 04/03/2014  2017 Elsevier   Fall Prevention in the Home Introduction Falls can cause injuries. They can happen to people of all ages. There are many things you can do to make your home safe and to help prevent falls. What can I do on the outside of my home?  Regularly fix the edges of walkways and driveways and fix any cracks.  Remove anything that might make you trip as you walk through a door, such as a raised step or threshold.  Trim any bushes or trees on the path to your home.  Use bright outdoor lighting.  Clear any walking paths of anything that might make someone trip, such as rocks or tools.  Regularly check to see if handrails are loose or broken. Make sure that both sides of any steps have handrails.  Any raised decks and porches should have guardrails on the edges.  Have any leaves, snow, or ice cleared regularly.  Use sand or salt on walking paths during winter.  Clean up any spills in your garage right away. This includes oil or grease spills. What can I do in the bathroom?  Use night lights.  Install grab bars by the toilet and in the tub and shower. Do not use towel bars as grab bars.  Use non-skid mats or decals in the tub or shower.  If you need to sit down in the shower, use a plastic, non-slip stool.  Keep the floor dry. Clean up any water that spills on the floor as soon as it happens.  Remove soap buildup in the tub or shower  regularly.  Attach bath mats securely with double-sided non-slip rug tape.  Do not have throw rugs and other things on the floor that can make you trip. What can I do in the bedroom?  Use night lights.  Make sure that you have a light by your bed that is easy to reach.  Do not use any sheets or blankets that are too big for your bed. They should not hang down onto the floor.  Have a firm chair that has side arms. You can use this for support while you get dressed.  Do not have throw rugs and other things on the floor that can make you trip. What can  I do in the kitchen?  Clean up any spills right away.  Avoid walking on wet floors.  Keep items that you use a lot in easy-to-reach places.  If you need to reach something above you, use a strong step stool that has a grab bar.  Keep electrical cords out of the way.  Do not use floor polish or wax that makes floors slippery. If you must use wax, use non-skid floor wax.  Do not have throw rugs and other things on the floor that can make you trip. What can I do with my stairs?  Do not leave any items on the stairs.  Make sure that there are handrails on both sides of the stairs and use them. Fix handrails that are broken or loose. Make sure that handrails are as long as the stairways.  Check any carpeting to make sure that it is firmly attached to the stairs. Fix any carpet that is loose or worn.  Avoid having throw rugs at the top or bottom of the stairs. If you do have throw rugs, attach them to the floor with carpet tape.  Make sure that you have a light switch at the top of the stairs and the bottom of the stairs. If you do not have them, ask someone to add them for you. What else can I do to help prevent falls?  Wear shoes that:  Do not have high heels.  Have rubber bottoms.  Are comfortable and fit you well.  Are closed at the toe. Do not wear sandals.  If you use a stepladder:  Make sure that it is fully  opened. Do not climb a closed stepladder.  Make sure that both sides of the stepladder are locked into place.  Ask someone to hold it for you, if possible.  Clearly mark and make sure that you can see:  Any grab bars or handrails.  First and last steps.  Where the edge of each step is.  Use tools that help you move around (mobility aids) if they are needed. These include:  Canes.  Walkers.  Scooters.  Crutches.  Turn on the lights when you go into a dark area. Replace any light bulbs as soon as they burn out.  Set up your furniture so you have a clear path. Avoid moving your furniture around.  If any of your floors are uneven, fix them.  If there are any pets around you, be aware of where they are.  Review your medicines with your doctor. Some medicines can make you feel dizzy. This can increase your chance of falling. Ask your doctor what other things that you can do to help prevent falls. This information is not intended to replace advice given to you by your health care provider. Make sure you discuss any questions you have with your health care provider. Document Released: 08/20/2009 Document Revised: 03/31/2016 Document Reviewed: 11/28/2014  2017 Elsevier  Health Maintenance, Female Introduction Adopting a healthy lifestyle and getting preventive care can go a long way to promote health and wellness. Talk with your health care provider about what schedule of regular examinations is right for you. This is a good chance for you to check in with your provider about disease prevention and staying healthy. In between checkups, there are plenty of things you can do on your own. Experts have done a lot of research about which lifestyle changes and preventive measures are most likely to keep you healthy. Ask your health care provider  for more information. Weight and diet Eat a healthy diet  Be sure to include plenty of vegetables, fruits, low-fat dairy products, and lean  protein.  Do not eat a lot of foods high in solid fats, added sugars, or salt.  Get regular exercise. This is one of the most important things you can do for your health.  Most adults should exercise for at least 150 minutes each week. The exercise should increase your heart rate and make you sweat (moderate-intensity exercise).  Most adults should also do strengthening exercises at least twice a week. This is in addition to the moderate-intensity exercise. Maintain a healthy weight  Body mass index (BMI) is a measurement that can be used to identify possible weight problems. It estimates body fat based on height and weight. Your health care provider can help determine your BMI and help you achieve or maintain a healthy weight.  For females 27 years of age and older:  A BMI below 18.5 is considered underweight.  A BMI of 18.5 to 24.9 is normal.  A BMI of 25 to 29.9 is considered overweight.  A BMI of 30 and above is considered obese. Watch levels of cholesterol and blood lipids  You should start having your blood tested for lipids and cholesterol at 73 years of age, then have this test every 5 years.  You may need to have your cholesterol levels checked more often if:  Your lipid or cholesterol levels are high.  You are older than 73 years of age.  You are at high risk for heart disease. Cancer screening Lung Cancer  Lung cancer screening is recommended for adults 87-24 years old who are at high risk for lung cancer because of a history of smoking.  A yearly low-dose CT scan of the lungs is recommended for people who:  Currently smoke.  Have quit within the past 15 years.  Have at least a 30-pack-year history of smoking. A pack year is smoking an average of one pack of cigarettes a day for 1 year.  Yearly screening should continue until it has been 15 years since you quit.  Yearly screening should stop if you develop a health problem that would prevent you from having  lung cancer treatment. Breast Cancer  Practice breast self-awareness. This means understanding how your breasts normally appear and feel.  It also means doing regular breast self-exams. Let your health care provider know about any changes, no matter how small.  If you are in your 20s or 30s, you should have a clinical breast exam (CBE) by a health care provider every 1-3 years as part of a regular health exam.  If you are 78 or older, have a CBE every year. Also consider having a breast X-ray (mammogram) every year.  If you have a family history of breast cancer, talk to your health care provider about genetic screening.  If you are at high risk for breast cancer, talk to your health care provider about having an MRI and a mammogram every year.  Breast cancer gene (BRCA) assessment is recommended for women who have family members with BRCA-related cancers. BRCA-related cancers include:  Breast.  Ovarian.  Tubal.  Peritoneal cancers.  Results of the assessment will determine the need for genetic counseling and BRCA1 and BRCA2 testing. Cervical Cancer  Your health care provider may recommend that you be screened regularly for cancer of the pelvic organs (ovaries, uterus, and vagina). This screening involves a pelvic examination, including checking for microscopic changes  to the surface of your cervix (Pap test). You may be encouraged to have this screening done every 3 years, beginning at age 78.  For women ages 17-65, health care providers may recommend pelvic exams and Pap testing every 3 years, or they may recommend the Pap and pelvic exam, combined with testing for human papilloma virus (HPV), every 5 years. Some types of HPV increase your risk of cervical cancer. Testing for HPV may also be done on women of any age with unclear Pap test results.  Other health care providers may not recommend any screening for nonpregnant women who are considered low risk for pelvic cancer and who do  not have symptoms. Ask your health care provider if a screening pelvic exam is right for you.  If you have had past treatment for cervical cancer or a condition that could lead to cancer, you need Pap tests and screening for cancer for at least 20 years after your treatment. If Pap tests have been discontinued, your risk factors (such as having a new sexual partner) need to be reassessed to determine if screening should resume. Some women have medical problems that increase the chance of getting cervical cancer. In these cases, your health care provider may recommend more frequent screening and Pap tests. Colorectal Cancer  This type of cancer can be detected and often prevented.  Routine colorectal cancer screening usually begins at 73 years of age and continues through 73 years of age.  Your health care provider may recommend screening at an earlier age if you have risk factors for colon cancer.  Your health care provider may also recommend using home test kits to check for hidden blood in the stool.  A small camera at the end of a tube can be used to examine your colon directly (sigmoidoscopy or colonoscopy). This is done to check for the earliest forms of colorectal cancer.  Routine screening usually begins at age 65.  Direct examination of the colon should be repeated every 5-10 years through 73 years of age. However, you may need to be screened more often if early forms of precancerous polyps or small growths are found. Skin Cancer  Check your skin from head to toe regularly.  Tell your health care provider about any new moles or changes in moles, especially if there is a change in a mole's shape or color.  Also tell your health care provider if you have a mole that is larger than the size of a pencil eraser.  Always use sunscreen. Apply sunscreen liberally and repeatedly throughout the day.  Protect yourself by wearing long sleeves, pants, a wide-brimmed hat, and sunglasses  whenever you are outside. Heart disease, diabetes, and high blood pressure  High blood pressure causes heart disease and increases the risk of stroke. High blood pressure is more likely to develop in:  People who have blood pressure in the high end of the normal range (130-139/85-89 mm Hg).  People who are overweight or obese.  People who are African American.  If you are 23-62 years of age, have your blood pressure checked every 3-5 years. If you are 70 years of age or older, have your blood pressure checked every year. You should have your blood pressure measured twice-once when you are at a hospital or clinic, and once when you are not at a hospital or clinic. Record the average of the two measurements. To check your blood pressure when you are not at a hospital or clinic, you can use:  An automated blood pressure machine at a pharmacy.  A home blood pressure monitor.  If you are between 41 years and 80 years old, ask your health care provider if you should take aspirin to prevent strokes.  Have regular diabetes screenings. This involves taking a blood sample to check your fasting blood sugar level.  If you are at a normal weight and have a low risk for diabetes, have this test once every three years after 73 years of age.  If you are overweight and have a high risk for diabetes, consider being tested at a younger age or more often. Preventing infection Hepatitis B  If you have a higher risk for hepatitis B, you should be screened for this virus. You are considered at high risk for hepatitis B if:  You were born in a country where hepatitis B is common. Ask your health care provider which countries are considered high risk.  Your parents were born in a high-risk country, and you have not been immunized against hepatitis B (hepatitis B vaccine).  You have HIV or AIDS.  You use needles to inject street drugs.  You live with someone who has hepatitis B.  You have had sex with  someone who has hepatitis B.  You get hemodialysis treatment.  You take certain medicines for conditions, including cancer, organ transplantation, and autoimmune conditions. Hepatitis C  Blood testing is recommended for:  Everyone born from [birth year] through 1967.  Anyone with known risk factors for hepatitis C. Sexually transmitted infections (STIs)  You should be screened for sexually transmitted infections (STIs) including gonorrhea and chlamydia if:  You are sexually active and are younger than 73 years of age.  You are older than 73 years of age and your health care provider tells you that you are at risk for this type of infection.  Your sexual activity has changed since you were last screened and you are at an increased risk for chlamydia or gonorrhea. Ask your health care provider if you are at risk.  If you do not have HIV, but are at risk, it may be recommended that you take a prescription medicine daily to prevent HIV infection. This is called pre-exposure prophylaxis (PrEP). You are considered at risk if:  You are sexually active and do not regularly use condoms or know the HIV status of your partner(s).  You take drugs by injection.  You are sexually active with a partner who has HIV. Talk with your health care provider about whether you are at high risk of being infected with HIV. If you choose to begin PrEP, you should first be tested for HIV. You should then be tested every 3 months for as long as you are taking PrEP. Pregnancy  If you are premenopausal and you may become pregnant, ask your health care provider about preconception counseling.  If you may become pregnant, take 400 to 800 micrograms (mcg) of folic acid every day.  If you want to prevent pregnancy, talk to your health care provider about birth control (contraception). Osteoporosis and menopause  Osteoporosis is a disease in which the bones lose minerals and strength with aging. This can result in  serious bone fractures. Your risk for osteoporosis can be identified using a bone density scan.  If you are 35 years of age or older, or if you are at risk for osteoporosis and fractures, ask your health care provider if you should be screened.  Ask your health care provider whether you should take  a calcium or vitamin D supplement to lower your risk for osteoporosis.  Menopause may have certain physical symptoms and risks.  Hormone replacement therapy may reduce some of these symptoms and risks. Talk to your health care provider about whether hormone replacement therapy is right for you. Follow these instructions at home:  Schedule regular health, dental, and eye exams.  Stay current with your immunizations.  Do not use any tobacco products including cigarettes, chewing tobacco, or electronic cigarettes.  If you are pregnant, do not drink alcohol.  If you are breastfeeding, limit how much and how often you drink alcohol.  Limit alcohol intake to no more than 1 drink per day for nonpregnant women. One drink equals 12 ounces of beer, 5 ounces of wine, or 1 ounces of hard liquor.  Do not use street drugs.  Do not share needles.  Ask your health care provider for help if you need support or information about quitting drugs.  Tell your health care provider if you often feel depressed.  Tell your health care provider if you have ever been abused or do not feel safe at home. This information is not intended to replace advice given to you by your health care provider. Make sure you discuss any questions you have with your health care provider. Document Released: 05/09/2011 Document Revised: 03/31/2016 Document Reviewed: 07/28/2015  2017 [employer]   Hearing Loss Introduction Hearing loss is a partial or total loss of the ability to hear. This can be temporary or permanent, and it can happen in one or both ears. Hearing loss may be referred to as deafness. Medical care is necessary  to treat hearing loss properly and to prevent the condition from getting worse. Your hearing may partially or completely come back, depending on what caused your hearing loss and how severe it is. In some cases, hearing loss is permanent. What are the causes? Common causes of hearing loss include:  Too much wax in the ear canal.  Infection of the ear canal or middle ear.  Fluid in the middle ear.  Injury to the ear or surrounding area.  An object stuck in the ear.  Prolonged exposure to loud sounds, such as music. Less common causes of hearing loss include:  Tumors in the ear.  Viral or bacterial infections, such as meningitis.  A hole in the eardrum (perforated eardrum).  Problems with the hearing nerve that sends signals between the brain and the ear.  Certain medicines. What are the signs or symptoms? Symptoms of this condition may include:  Difficulty telling the difference between sounds.  Difficulty following a conversation when there is background noise.  Lack of response to sounds in your environment. This may be most noticeable when you do not respond to startling sounds.  Needing to turn up the volume on the television, radio, etc.  Ringing in the ears.  Dizziness.  Pain in the ears. How is this diagnosed? This condition is diagnosed based on a physical exam and a hearing test (audiometry). The audiometry test will be performed by a hearing specialist (audiologist). You may also be referred to an ear, nose, and throat (ENT) specialist (otolaryngologist). How is this treated? Treatment for recent onset of hearing loss may include:  Ear wax removal.  Being prescribed medicines to prevent infection (antibiotics).  Being prescribed medicines to reduce inflammation (corticosteroids). Follow these instructions at home:  If you were prescribed an antibiotic medicine, take it as told by your health care provider. Do not stop  taking the antibiotic even if you  start to feel better.  Take over-the-counter and prescription medicines only as told by your health care provider.  Avoid loud noises.  Return to your normal activities as told by your health care provider. Ask your health care provider what activities are safe for you.  Keep all follow-up visits as told by your health care provider. This is important. Contact a health care provider if:  You feel dizzy.  You develop new symptoms.  You vomit or feel nauseous.  You have a fever. Get help right away if:  You develop sudden changes in your vision.  You have severe ear pain.  You have new or increased weakness.  You have a severe headache. This information is not intended to replace advice given to you by your health care provider. Make sure you discuss any questions you have with your health care provider. Document Released: 10/24/2005 Document Revised: 03/31/2016 Document Reviewed: 03/11/2015  2017 [employer]vier

## 2016-12-28 DIAGNOSIS — N905 Atrophy of vulva: Secondary | ICD-10-CM | POA: Diagnosis not present

## 2017-01-01 NOTE — Progress Notes (Signed)
HPI:  Kelly Petersen is a pleasant 71 y.o. here for follow up. Chronic medical problems summarized below were reviewed for changes and stability and were updated as needed below. These issues and their treatment remain stable for the most part. She saw susan for an AWV recently and apparently did a lifescreening offered by an outside company and blood sugar was < 99 and she was told she had "prediabetes" which we already were following and for some unknown reason she was screened for carotid artery disease. HgbA1c was 6. Lipid were normal. She had apparently very mild abnormal flow in one carotid artery on the lifeline screen and lifestyle recommendations were advised. She also reported urine sl darker then in the past - does not like to drink fluids. No dysuria. Denies CP, SOB, DOE, treatment intolerance or new symptoms.  Asthma and Allergens: -dx in 1998 -never hospitalized  Adrenal Mass: -managed by endocrinologist in the past, I do not have records -she reports was benign and did not need further imaging after several years of monitoring  Hyperlipidemia/Prediabetes: -labs 12/2015 ok -no regular aerobic exercise; poor diet -on crestor 5mg  for a long time -reports lifelong low blood pressure and occ lightheaded when stand suddenly if does not eat beef?  Depression/Insomnia: -for several years but now resolved - on zoloft in the past and worked well -takes low dose of trazadone  -denies: SI, depression, hx of hospitalization  Eczema: -sees skin surgery center, uses clobetasol sol  Osteopenia: -sees gyn for this, Dr. Alwyn Pea -hx uterine ca -taking Evista since hysterectomy - always rxed by gyn -did dexa with gyn, but she did not remember this  ROS: See pertinent positives and negatives per HPI.  Past Medical History:  Diagnosis Date  . Asthma   . Chicken pox   . Depression   . Eczema   . Endometrial cancer ([place])   . Environmental allergies   . Glaucoma   . Hyperglycemia    . Hyperlipidemia   . Insomnia   . Osteopenia     Past Surgical History:  Procedure Laterality Date  . 2004    . ABDOMINAL HYSTERECTOMY    . BREAST SURGERY      Family History  Problem Relation Age of Onset  . Lung cancer    . Hyperlipidemia      Social History   Social History  . Marital status: Single    Spouse name: N/A  . Number of children: N/A  . Years of education: N/A   Social History Main Topics  . Smoking status: Never Smoker  . Smokeless tobacco: Never Used  . Alcohol use Yes     Comment: 1 drink rarely  . Drug use: No  . Sexual activity: Not Asked   Other Topics Concern  . None   Social History Narrative   Work or School: cares for grandchild      Home Situation: lives alone      Spiritual Beliefs: Christian, no church currently      Lifestyle: no regular exercise; diet is poor           Current Outpatient Prescriptions:  .  ASMANEX 120 METERED DOSES 220 MCG/INH inhaler, USE 2 INHALATIONS TWICE A DAY. EXPIRES 45 DAYS, Disp: 3 Inhaler, Rfl: 1 .  Brimonidine Tartrate-Timolol (COMBIGAN OP), Apply 1 drop to eye 2 (two) times daily., Disp: , Rfl:  .  clobetasol (TEMOVATE) 0.05 % external solution, Apply 1 application topically as needed., Disp: , Rfl: 0 .  montelukast (  SINGULAIR) 10 MG tablet, TAKE 1 TABLET AT BEDTIME, Disp: 90 tablet, Rfl: 1 .  Pseudoephedrine-Ibuprofen (ADVIL COLD/SINUS PO), Take by mouth as needed., Disp: , Rfl:  .  raloxifene (EVISTA) 60 MG tablet, Take 60 mg by mouth daily., Disp: , Rfl:  .  rosuvastatin (CRESTOR) 5 MG tablet, TAKE 1 TABLET DAILY, Disp: 90 tablet, Rfl: 1 .  sertraline (ZOLOFT) 50 MG tablet, Take 25 mg by mouth daily. , Disp: , Rfl:  .  traZODone (DESYREL) 50 MG tablet, Take 1 tablet (50 mg total) by mouth at bedtime., Disp: 90 tablet, Rfl: 0  EXAM:  Vitals:   01/02/17 1057  BP: 100/60  Pulse: 87  Temp: 98 F (36.7 C)    Body mass index is 22.9 kg/m.  GENERAL: vitals reviewed and listed above,  alert, oriented, appears well hydrated and in no acute distress  HEENT: atraumatic, conjunttiva clear, no obvious abnormalities on inspection of external nose and ears  NECK: no obvious masses on inspection  LUNGS: clear to auscultation bilaterally, no wheezes, rales or rhonchi, good air movement  CV: HRRR, no peripheral edema  MS: moves all extremities without noticeable abnormality  PSYCH: pleasant and cooperative, no obvious depression or anxiety  ASSESSMENT AND PLAN:  Discussed the following assessment and plan:  Hyperlipidemia, unspecified hyperlipidemia type  Hyperglycemia  Adrenal mass ([place]) - -monitored by endocrinologist in the past-reports told did not need further imaging  Dark urine  -lifestyle recs for reduction CV risks - Mediterranean diet/lifestyle -check urine dip -I don't have records for prior adrenal mass monitoring - she reports was done with imaging monitoring and was told was benign - advised pt to have records sent to [place] to review or that we could request records - she does not remember name of doctor she was seeing but agrees to obtain -Patient advised to return or notify a doctor immediately if symptoms worsen or persist or new concerns arise.  Patient Instructions  BEFORE YOU LEAVE: -udip -follow up: 4-6 months  Please let us know the name of the endocrinologist you were seeing or request that they send office visit notes and studies to [place].   We recommend the following healthy lifestyle for LIFE and for reduction of cardiovascular risks: 1) Small portions.   Tip: eat off of a salad plate instead of a dinner plate.  Tip: if you need more or a snack choose fruits, veggies and/or a handful of nuts or seeds.  2) Eat a healthy clean diet.  * Tip: Avoid (less then 1 serving per week): processed foods, sweets, sweetened drinks, white starches (rice, flour, bread, potatoes, pasta, etc), red meat, fast foods, butter  *Tip: CHOOSE instead   * 5-9  servings per day of fresh or frozen fruits and vegetables (but not corn, potatoes, bananas, canned or dried fruit)   *nuts and seeds, beans   *olives and olive oil   *small portions of lean meats such as fish and white chicken    *small portions of whole grains  3)Get at least 150 minutes of sweaty aerobic exercise per week.  4)Reduce stress - consider counseling, meditation and relaxation to balance other aspects of your life.     Colin Benton R., DO

## 2017-01-02 ENCOUNTER — Encounter: Payer: Self-pay | Admitting: Family Medicine

## 2017-01-02 ENCOUNTER — Ambulatory Visit (INDEPENDENT_AMBULATORY_CARE_PROVIDER_SITE_OTHER): Payer: Medicare Other | Admitting: Family Medicine

## 2017-01-02 VITALS — BP 100/60 | HR 87 | Temp 98.0°F | Ht 61.0 in | Wt 121.2 lb

## 2017-01-02 DIAGNOSIS — R739 Hyperglycemia, unspecified: Secondary | ICD-10-CM

## 2017-01-02 DIAGNOSIS — E785 Hyperlipidemia, unspecified: Secondary | ICD-10-CM

## 2017-01-02 DIAGNOSIS — E279 Disorder of adrenal gland, unspecified: Secondary | ICD-10-CM | POA: Diagnosis not present

## 2017-01-02 DIAGNOSIS — R82998 Other abnormal findings in urine: Secondary | ICD-10-CM

## 2017-01-02 DIAGNOSIS — E278 Other specified disorders of adrenal gland: Secondary | ICD-10-CM

## 2017-01-02 DIAGNOSIS — R8299 Other abnormal findings in urine: Secondary | ICD-10-CM

## 2017-01-02 HISTORY — DX: Other specified disorders of adrenal gland: E27.8

## 2017-01-02 LAB — POCT URINALYSIS DIPSTICK
Bilirubin, UA: NEGATIVE
GLUCOSE UA: NEGATIVE
Ketones, UA: NEGATIVE
LEUKOCYTES UA: NEGATIVE
NITRITE UA: NEGATIVE
Protein, UA: NEGATIVE
RBC UA: NEGATIVE
Spec Grav, UA: 1.015
UROBILINOGEN UA: 0.2
pH, UA: 7.5

## 2017-01-02 MED ORDER — TRAZODONE HCL 50 MG PO TABS: 50.0000 mg | ORAL_TABLET | Freq: Every day | ORAL | 0 refills | Status: DC

## 2017-01-02 NOTE — Telephone Encounter (Signed)
I spoke with the pt today and she stated she is now using [facility] as her mail order and needs the Rx sent there instead of the local pharmacy.  Rx sent to [facility].

## 2017-01-02 NOTE — Patient Instructions (Signed)
BEFORE YOU LEAVE: -udip -follow up: 4-6 months  Please let us know the name of the endocrinologist you were seeing or request that they send office visit notes and studies to [place].   We recommend the following healthy lifestyle for LIFE and for reduction of cardiovascular risks: 1) Small portions.   Tip: eat off of a salad plate instead of a dinner plate.  Tip: if you need more or a snack choose fruits, veggies and/or a handful of nuts or seeds.  2) Eat a healthy clean diet.  * Tip: Avoid (less then 1 serving per week): processed foods, sweets, sweetened drinks, white starches (rice, flour, bread, potatoes, pasta, etc), red meat, fast foods, butter  *Tip: CHOOSE instead   * 5-9 servings per day of fresh or frozen fruits and vegetables (but not corn, potatoes, bananas, canned or dried fruit)   *nuts and seeds, beans   *olives and olive oil   *small portions of lean meats such as fish and white chicken    *small portions of whole grains  3)Get at least 150 minutes of sweaty aerobic exercise per week.  4)Reduce stress - consider counseling, meditation and relaxation to balance other aspects of your life.

## 2017-01-02 NOTE — Progress Notes (Signed)
Pre visit review using our clinic review tool, if applicable. No additional management support is needed unless otherwise documented below in the visit note. 

## 2017-01-02 NOTE — Addendum Note (Signed)
Addended by: Agnes Lawrence on: 01/02/2017 11:45 AM   Modules accepted: Orders

## 2017-01-25 DIAGNOSIS — H401131 Primary open-angle glaucoma, bilateral, mild stage: Secondary | ICD-10-CM | POA: Diagnosis not present

## 2017-01-31 ENCOUNTER — Other Ambulatory Visit: Payer: Self-pay | Admitting: Family Medicine

## 2017-02-01 DIAGNOSIS — Z1231 Encounter for screening mammogram for malignant neoplasm of breast: Secondary | ICD-10-CM | POA: Diagnosis not present

## 2017-02-25 ENCOUNTER — Other Ambulatory Visit: Payer: Self-pay | Admitting: Family Medicine

## 2017-04-06 ENCOUNTER — Other Ambulatory Visit: Payer: Self-pay | Admitting: *Deleted

## 2017-04-06 MED ORDER — MONTELUKAST SODIUM 10 MG PO TABS: 10.0000 mg | ORAL_TABLET | Freq: Every day | ORAL | 1 refills | Status: DC

## 2017-04-06 MED ORDER — MOMETASONE FUROATE 220 MCG/INH IN AEPB: INHALATION_SPRAY | RESPIRATORY_TRACT | 1 refills | Status: DC

## 2017-04-06 NOTE — Telephone Encounter (Signed)
[facility] Rx Refill Request  Last office visit 01/02/17    sertraline (ZOLOFT) 50 MG tablet - historical med on medication list  Okay to fill?

## 2017-04-06 NOTE — Telephone Encounter (Signed)
See if she is still taking the zoloft and it was 25 mg in the past (half)... A very low dose. Can refill if taking at current dose for 90 days, 1 refill. Thanks!

## 2017-04-07 MED ORDER — SERTRALINE HCL 25 MG PO TABS: 25.0000 mg | ORAL_TABLET | Freq: Every day | ORAL | 1 refills | Status: DC

## 2017-04-07 NOTE — Telephone Encounter (Signed)
Spoke with patient and she is taking Zoloft 25 mg.  Rx sent and med list updated.

## 2017-04-10 ENCOUNTER — Telehealth: Payer: Self-pay | Admitting: *Deleted

## 2017-04-10 NOTE — Telephone Encounter (Signed)
Yes, ok to refill x 1 year. She had labs at work - can we get copy to abstract? Thanks.

## 2017-04-10 NOTE — Telephone Encounter (Signed)
Last lab appointment 12/14/15 and last office visit 12/13/16. Okay to fill?

## 2017-04-11 MED ORDER — ROSUVASTATIN CALCIUM 5 MG PO TABS: 5.0000 mg | ORAL_TABLET | Freq: Every day | ORAL | 3 refills | Status: DC

## 2017-04-11 NOTE — Telephone Encounter (Signed)
Rx sent. Left message on machine for patient to return our call.

## 2017-04-18 NOTE — Telephone Encounter (Signed)
Spoke with patient and she is now retired and no longer gets her labs done there.

## 2017-06-20 ENCOUNTER — Ambulatory Visit (INDEPENDENT_AMBULATORY_CARE_PROVIDER_SITE_OTHER): Payer: Medicare Other | Admitting: Family Medicine

## 2017-06-20 ENCOUNTER — Telehealth: Payer: Self-pay | Admitting: *Deleted

## 2017-06-20 ENCOUNTER — Encounter: Payer: Self-pay | Admitting: Family Medicine

## 2017-06-20 VITALS — BP 100/60 | HR 80 | Temp 98.3°F | Ht 61.0 in | Wt 124.5 lb

## 2017-06-20 DIAGNOSIS — E785 Hyperlipidemia, unspecified: Secondary | ICD-10-CM | POA: Diagnosis not present

## 2017-06-20 DIAGNOSIS — M25551 Pain in right hip: Secondary | ICD-10-CM

## 2017-06-20 DIAGNOSIS — M79672 Pain in left foot: Secondary | ICD-10-CM

## 2017-06-20 DIAGNOSIS — M79606 Pain in leg, unspecified: Secondary | ICD-10-CM | POA: Diagnosis not present

## 2017-06-20 NOTE — Progress Notes (Signed)
HPI:  Acute visit for R hip pain: -x2 months -Lateral hip, worse when first stands up and on steps, then improves -Associated symptoms: Pain and left plantar heel with initial stepping, been better, sometimes soreness throughout legs and standing that improves with activity -Changes and physical activity: Has been caring for ill pet - lifting and carrying up and down stairs multiple times per day -worries if is related to statin as had change in manufacturer during this time, now back on original product -no weakness, numbness, fevers, cp, SOB, swelling, rashes -hx very mildly positive ABI screen with [facility]eline in the past and she wonders if this is "my heart"  ROS: See pertinent positives and negatives per HPI.  Past Medical History:  Diagnosis Date  . Asthma   . Chicken pox   . Depression   . Eczema   . Endometrial cancer ([place])   . Environmental allergies   . Glaucoma   . Hyperglycemia   . Hyperlipidemia   . Insomnia   . Osteopenia     Past Surgical History:  Procedure Laterality Date  . 2004    . ABDOMINAL HYSTERECTOMY    . BREAST SURGERY      Family History  Problem Relation Age of Onset  . Lung cancer Unknown   . Hyperlipidemia Unknown     Social History   Social History  . Marital status: Single    Spouse name: N/A  . Number of children: N/A  . Years of education: N/A   Social History Main Topics  . Smoking status: Never Smoker  . Smokeless tobacco: Never Used  . Alcohol use Yes     Comment: 1 drink rarely  . Drug use: No  . Sexual activity: Not Asked   Other Topics Concern  . None   Social History Narrative   Work or School: cares for grandchild      Home Situation: lives alone      Spiritual Beliefs: Christian, no church currently      Lifestyle: no regular exercise; diet is poor           Current Outpatient Prescriptions:  .  Brimonidine Tartrate-Timolol (COMBIGAN OP), Apply 1 drop to eye 2 (two) times daily., Disp: , Rfl:  .   clobetasol (TEMOVATE) 0.05 % external solution, Apply 1 application topically as needed., Disp: , Rfl: 0 .  mometasone (ASMANEX 120 METERED DOSES) 220 MCG/INH inhaler, USE 2 INHALATIONS TWICE A DAY (EXPIRES 45 DAYS AFTER OPENING), Disp: 3 Inhaler, Rfl: 1 .  montelukast (SINGULAIR) 10 MG tablet, Take 1 tablet (10 mg total) by mouth at bedtime., Disp: 90 tablet, Rfl: 1 .  Pseudoephedrine-Ibuprofen (ADVIL COLD/SINUS PO), Take by mouth as needed., Disp: , Rfl:  .  raloxifene (EVISTA) 60 MG tablet, Take 60 mg by mouth daily., Disp: , Rfl:  .  rosuvastatin (CRESTOR) 5 MG tablet, Take 1 tablet (5 mg total) by mouth daily., Disp: 90 tablet, Rfl: 3 .  sertraline (ZOLOFT) 25 MG tablet, Take 1 tablet (25 mg total) by mouth daily., Disp: 90 tablet, Rfl: 1 .  traZODone (DESYREL) 50 MG tablet, TAKE 1 TABLET BY MOUTH AT  BEDTIME, Disp: 90 tablet, Rfl: 3  EXAM:  Vitals:   06/20/17 1035  BP: 100/60  Pulse: 80  Temp: 98.3 F (36.8 C)    Body mass index is 23.52 kg/m.  GENERAL: vitals reviewed and listed above, alert, oriented, appears well hydrated and in no acute distress  HEENT: atraumatic, conjunttiva clear, no obvious abnormalities  on inspection of external nose and ears  NECK: no obvious masses on inspection  LUNGS: clear to auscultation bilaterally, no wheezes, rales or rhonchi, good air movement  CV: HRRR, no peripheral edema  MS: moves all extremities without noticeable abnormality, tenderness to palpation right trochanteric bursa region and also the left plantar fascial region, neurovascular exam normal in bilateral lower extremities, no other significant tenderness to palpation  PSYCH: pleasant and cooperative, no obvious depression or anxiety  ASSESSMENT AND PLAN:  Discussed the following assessment and plan:  Lateral pain of right hip  Pain of left heel  Pain of lower extremity, unspecified laterality  Hyperlipidemia, unspecified hyperlipidemia type  -Likely separate  musculoskeletal issues related to change in activity  - trochanteric bursitis, possible plantar fasciitis  -Advised physical therapy for this, but she would prefer to do exercises at home -Not classic for claudication, but did offer referral to cardiology - she opted to hold off on this for now  -Home exercise program, she wanted to try a trial off her Crestor to see if this helps and this is reasonable  -Advised she call us in 2 weeks to let us know if off Crestor has helped  -Advised follow-up in 4 weeks  -Patient advised to return or notify a doctor immediately if symptoms worsen or persist or new concerns arise.  Patient Instructions  BEFORE YOU LEAVE: -follow up: 1 month -greater trochanteric bursitis exercises  Can try holding the crestor for 2 weeks to see if this helps. Please let us know.  Do the exercises for the R hip at least 4 days per week.  Ibuprofen or aleve if needed for pain.  I hope you are feeling better soon! Follow up sooner if worsening, new concerns or you are not improving with treatment.     Colin Benton R., DO

## 2017-06-20 NOTE — Telephone Encounter (Signed)
Per the pts request, I mailed a copy of exercises for trochanteric bursitis to the pts home address.

## 2017-06-20 NOTE — Patient Instructions (Addendum)
BEFORE YOU LEAVE: -follow up: 1 month -greater trochanteric bursitis exercises  Can try holding the crestor for 2 weeks to see if this helps. Please let us know.  Do the exercises for the R hip at least 4 days per week.  Ibuprofen or aleve if needed for pain.  I hope you are feeling better soon! Follow up sooner if worsening, new concerns or you are not improving with treatment.

## 2017-07-17 DIAGNOSIS — L821 Other seborrheic keratosis: Secondary | ICD-10-CM | POA: Diagnosis not present

## 2017-07-17 DIAGNOSIS — H61009 Unspecified perichondritis of external ear, unspecified ear: Secondary | ICD-10-CM | POA: Diagnosis not present

## 2017-07-17 DIAGNOSIS — L820 Inflamed seborrheic keratosis: Secondary | ICD-10-CM | POA: Diagnosis not present

## 2017-07-17 DIAGNOSIS — L814 Other melanin hyperpigmentation: Secondary | ICD-10-CM | POA: Diagnosis not present

## 2017-07-17 DIAGNOSIS — L218 Other seborrheic dermatitis: Secondary | ICD-10-CM | POA: Diagnosis not present

## 2017-07-17 DIAGNOSIS — D1801 Hemangioma of skin and subcutaneous tissue: Secondary | ICD-10-CM | POA: Diagnosis not present

## 2017-08-21 ENCOUNTER — Ambulatory Visit (INDEPENDENT_AMBULATORY_CARE_PROVIDER_SITE_OTHER): Payer: Medicare Other | Admitting: *Deleted

## 2017-08-21 DIAGNOSIS — Z23 Encounter for immunization: Secondary | ICD-10-CM | POA: Diagnosis not present

## 2017-08-23 DIAGNOSIS — H18713 Corneal ectasia, bilateral: Secondary | ICD-10-CM | POA: Diagnosis not present

## 2017-08-23 DIAGNOSIS — H401131 Primary open-angle glaucoma, bilateral, mild stage: Secondary | ICD-10-CM | POA: Diagnosis not present

## 2017-08-23 DIAGNOSIS — H52213 Irregular astigmatism, bilateral: Secondary | ICD-10-CM | POA: Diagnosis not present

## 2017-08-23 DIAGNOSIS — H2513 Age-related nuclear cataract, bilateral: Secondary | ICD-10-CM | POA: Diagnosis not present

## 2017-09-02 ENCOUNTER — Other Ambulatory Visit: Payer: Self-pay | Admitting: Family Medicine

## 2017-09-04 DIAGNOSIS — M9901 Segmental and somatic dysfunction of cervical region: Secondary | ICD-10-CM | POA: Diagnosis not present

## 2017-09-04 DIAGNOSIS — M4326 Fusion of spine, lumbar region: Secondary | ICD-10-CM | POA: Diagnosis not present

## 2017-09-04 DIAGNOSIS — M9903 Segmental and somatic dysfunction of lumbar region: Secondary | ICD-10-CM | POA: Diagnosis not present

## 2017-09-04 DIAGNOSIS — M9905 Segmental and somatic dysfunction of pelvic region: Secondary | ICD-10-CM | POA: Diagnosis not present

## 2017-09-04 DIAGNOSIS — M4146 Neuromuscular scoliosis, lumbar region: Secondary | ICD-10-CM | POA: Diagnosis not present

## 2017-09-04 DIAGNOSIS — M9902 Segmental and somatic dysfunction of thoracic region: Secondary | ICD-10-CM | POA: Diagnosis not present

## 2017-09-04 DIAGNOSIS — M25571 Pain in right ankle and joints of right foot: Secondary | ICD-10-CM | POA: Diagnosis not present

## 2017-09-04 DIAGNOSIS — M545 Low back pain: Secondary | ICD-10-CM | POA: Diagnosis not present

## 2017-09-04 DIAGNOSIS — M5431 Sciatica, right side: Secondary | ICD-10-CM | POA: Diagnosis not present

## 2017-09-11 DIAGNOSIS — M545 Low back pain: Secondary | ICD-10-CM | POA: Diagnosis not present

## 2017-09-11 DIAGNOSIS — M9902 Segmental and somatic dysfunction of thoracic region: Secondary | ICD-10-CM | POA: Diagnosis not present

## 2017-09-11 DIAGNOSIS — M5431 Sciatica, right side: Secondary | ICD-10-CM | POA: Diagnosis not present

## 2017-09-11 DIAGNOSIS — M25571 Pain in right ankle and joints of right foot: Secondary | ICD-10-CM | POA: Diagnosis not present

## 2017-09-11 DIAGNOSIS — M9905 Segmental and somatic dysfunction of pelvic region: Secondary | ICD-10-CM | POA: Diagnosis not present

## 2017-09-11 DIAGNOSIS — M4326 Fusion of spine, lumbar region: Secondary | ICD-10-CM | POA: Diagnosis not present

## 2017-09-11 DIAGNOSIS — M4146 Neuromuscular scoliosis, lumbar region: Secondary | ICD-10-CM | POA: Diagnosis not present

## 2017-09-11 DIAGNOSIS — M9901 Segmental and somatic dysfunction of cervical region: Secondary | ICD-10-CM | POA: Diagnosis not present

## 2017-09-11 DIAGNOSIS — M9903 Segmental and somatic dysfunction of lumbar region: Secondary | ICD-10-CM | POA: Diagnosis not present

## 2017-09-13 DIAGNOSIS — M9903 Segmental and somatic dysfunction of lumbar region: Secondary | ICD-10-CM | POA: Diagnosis not present

## 2017-09-13 DIAGNOSIS — M4146 Neuromuscular scoliosis, lumbar region: Secondary | ICD-10-CM | POA: Diagnosis not present

## 2017-09-13 DIAGNOSIS — M9902 Segmental and somatic dysfunction of thoracic region: Secondary | ICD-10-CM | POA: Diagnosis not present

## 2017-09-13 DIAGNOSIS — M545 Low back pain: Secondary | ICD-10-CM | POA: Diagnosis not present

## 2017-09-13 DIAGNOSIS — M9905 Segmental and somatic dysfunction of pelvic region: Secondary | ICD-10-CM | POA: Diagnosis not present

## 2017-09-13 DIAGNOSIS — M5431 Sciatica, right side: Secondary | ICD-10-CM | POA: Diagnosis not present

## 2017-09-13 DIAGNOSIS — M9901 Segmental and somatic dysfunction of cervical region: Secondary | ICD-10-CM | POA: Diagnosis not present

## 2017-09-13 DIAGNOSIS — M4326 Fusion of spine, lumbar region: Secondary | ICD-10-CM | POA: Diagnosis not present

## 2017-09-13 DIAGNOSIS — M25571 Pain in right ankle and joints of right foot: Secondary | ICD-10-CM | POA: Diagnosis not present

## 2017-09-15 DIAGNOSIS — M4146 Neuromuscular scoliosis, lumbar region: Secondary | ICD-10-CM | POA: Diagnosis not present

## 2017-09-15 DIAGNOSIS — M4326 Fusion of spine, lumbar region: Secondary | ICD-10-CM | POA: Diagnosis not present

## 2017-09-15 DIAGNOSIS — M5431 Sciatica, right side: Secondary | ICD-10-CM | POA: Diagnosis not present

## 2017-09-15 DIAGNOSIS — M9902 Segmental and somatic dysfunction of thoracic region: Secondary | ICD-10-CM | POA: Diagnosis not present

## 2017-09-15 DIAGNOSIS — M545 Low back pain: Secondary | ICD-10-CM | POA: Diagnosis not present

## 2017-09-15 DIAGNOSIS — M9901 Segmental and somatic dysfunction of cervical region: Secondary | ICD-10-CM | POA: Diagnosis not present

## 2017-09-15 DIAGNOSIS — M9903 Segmental and somatic dysfunction of lumbar region: Secondary | ICD-10-CM | POA: Diagnosis not present

## 2017-09-15 DIAGNOSIS — M9905 Segmental and somatic dysfunction of pelvic region: Secondary | ICD-10-CM | POA: Diagnosis not present

## 2017-09-15 DIAGNOSIS — M25571 Pain in right ankle and joints of right foot: Secondary | ICD-10-CM | POA: Diagnosis not present

## 2017-09-18 DIAGNOSIS — M9905 Segmental and somatic dysfunction of pelvic region: Secondary | ICD-10-CM | POA: Diagnosis not present

## 2017-09-18 DIAGNOSIS — M25571 Pain in right ankle and joints of right foot: Secondary | ICD-10-CM | POA: Diagnosis not present

## 2017-09-18 DIAGNOSIS — M4146 Neuromuscular scoliosis, lumbar region: Secondary | ICD-10-CM | POA: Diagnosis not present

## 2017-09-18 DIAGNOSIS — M9902 Segmental and somatic dysfunction of thoracic region: Secondary | ICD-10-CM | POA: Diagnosis not present

## 2017-09-18 DIAGNOSIS — M545 Low back pain: Secondary | ICD-10-CM | POA: Diagnosis not present

## 2017-09-18 DIAGNOSIS — M9903 Segmental and somatic dysfunction of lumbar region: Secondary | ICD-10-CM | POA: Diagnosis not present

## 2017-09-18 DIAGNOSIS — M9901 Segmental and somatic dysfunction of cervical region: Secondary | ICD-10-CM | POA: Diagnosis not present

## 2017-09-18 DIAGNOSIS — M5431 Sciatica, right side: Secondary | ICD-10-CM | POA: Diagnosis not present

## 2017-09-18 DIAGNOSIS — M4326 Fusion of spine, lumbar region: Secondary | ICD-10-CM | POA: Diagnosis not present

## 2017-09-20 DIAGNOSIS — M9901 Segmental and somatic dysfunction of cervical region: Secondary | ICD-10-CM | POA: Diagnosis not present

## 2017-09-20 DIAGNOSIS — M9902 Segmental and somatic dysfunction of thoracic region: Secondary | ICD-10-CM | POA: Diagnosis not present

## 2017-09-20 DIAGNOSIS — M4326 Fusion of spine, lumbar region: Secondary | ICD-10-CM | POA: Diagnosis not present

## 2017-09-20 DIAGNOSIS — M25571 Pain in right ankle and joints of right foot: Secondary | ICD-10-CM | POA: Diagnosis not present

## 2017-09-20 DIAGNOSIS — M5431 Sciatica, right side: Secondary | ICD-10-CM | POA: Diagnosis not present

## 2017-09-20 DIAGNOSIS — M4146 Neuromuscular scoliosis, lumbar region: Secondary | ICD-10-CM | POA: Diagnosis not present

## 2017-09-20 DIAGNOSIS — M9905 Segmental and somatic dysfunction of pelvic region: Secondary | ICD-10-CM | POA: Diagnosis not present

## 2017-09-20 DIAGNOSIS — M9903 Segmental and somatic dysfunction of lumbar region: Secondary | ICD-10-CM | POA: Diagnosis not present

## 2017-09-20 DIAGNOSIS — M545 Low back pain: Secondary | ICD-10-CM | POA: Diagnosis not present

## 2017-09-22 DIAGNOSIS — M9905 Segmental and somatic dysfunction of pelvic region: Secondary | ICD-10-CM | POA: Diagnosis not present

## 2017-09-22 DIAGNOSIS — M25571 Pain in right ankle and joints of right foot: Secondary | ICD-10-CM | POA: Diagnosis not present

## 2017-09-22 DIAGNOSIS — M4146 Neuromuscular scoliosis, lumbar region: Secondary | ICD-10-CM | POA: Diagnosis not present

## 2017-09-22 DIAGNOSIS — M5431 Sciatica, right side: Secondary | ICD-10-CM | POA: Diagnosis not present

## 2017-09-22 DIAGNOSIS — M9901 Segmental and somatic dysfunction of cervical region: Secondary | ICD-10-CM | POA: Diagnosis not present

## 2017-09-22 DIAGNOSIS — M545 Low back pain: Secondary | ICD-10-CM | POA: Diagnosis not present

## 2017-09-22 DIAGNOSIS — M4326 Fusion of spine, lumbar region: Secondary | ICD-10-CM | POA: Diagnosis not present

## 2017-09-22 DIAGNOSIS — M9902 Segmental and somatic dysfunction of thoracic region: Secondary | ICD-10-CM | POA: Diagnosis not present

## 2017-09-22 DIAGNOSIS — M9903 Segmental and somatic dysfunction of lumbar region: Secondary | ICD-10-CM | POA: Diagnosis not present

## 2017-10-04 DIAGNOSIS — M4326 Fusion of spine, lumbar region: Secondary | ICD-10-CM | POA: Diagnosis not present

## 2017-10-04 DIAGNOSIS — M5431 Sciatica, right side: Secondary | ICD-10-CM | POA: Diagnosis not present

## 2017-10-04 DIAGNOSIS — M9901 Segmental and somatic dysfunction of cervical region: Secondary | ICD-10-CM | POA: Diagnosis not present

## 2017-10-04 DIAGNOSIS — M9902 Segmental and somatic dysfunction of thoracic region: Secondary | ICD-10-CM | POA: Diagnosis not present

## 2017-10-04 DIAGNOSIS — M4146 Neuromuscular scoliosis, lumbar region: Secondary | ICD-10-CM | POA: Diagnosis not present

## 2017-10-04 DIAGNOSIS — M9903 Segmental and somatic dysfunction of lumbar region: Secondary | ICD-10-CM | POA: Diagnosis not present

## 2017-10-04 DIAGNOSIS — M25571 Pain in right ankle and joints of right foot: Secondary | ICD-10-CM | POA: Diagnosis not present

## 2017-10-04 DIAGNOSIS — M9905 Segmental and somatic dysfunction of pelvic region: Secondary | ICD-10-CM | POA: Diagnosis not present

## 2017-10-04 DIAGNOSIS — M545 Low back pain: Secondary | ICD-10-CM | POA: Diagnosis not present

## 2017-10-06 DIAGNOSIS — M5431 Sciatica, right side: Secondary | ICD-10-CM | POA: Diagnosis not present

## 2017-10-06 DIAGNOSIS — M9903 Segmental and somatic dysfunction of lumbar region: Secondary | ICD-10-CM | POA: Diagnosis not present

## 2017-10-06 DIAGNOSIS — M9905 Segmental and somatic dysfunction of pelvic region: Secondary | ICD-10-CM | POA: Diagnosis not present

## 2017-10-06 DIAGNOSIS — M9901 Segmental and somatic dysfunction of cervical region: Secondary | ICD-10-CM | POA: Diagnosis not present

## 2017-10-06 DIAGNOSIS — M4146 Neuromuscular scoliosis, lumbar region: Secondary | ICD-10-CM | POA: Diagnosis not present

## 2017-10-06 DIAGNOSIS — M9902 Segmental and somatic dysfunction of thoracic region: Secondary | ICD-10-CM | POA: Diagnosis not present

## 2017-10-06 DIAGNOSIS — M4326 Fusion of spine, lumbar region: Secondary | ICD-10-CM | POA: Diagnosis not present

## 2017-10-06 DIAGNOSIS — M25571 Pain in right ankle and joints of right foot: Secondary | ICD-10-CM | POA: Diagnosis not present

## 2017-10-06 DIAGNOSIS — M545 Low back pain: Secondary | ICD-10-CM | POA: Diagnosis not present

## 2017-10-09 DIAGNOSIS — M545 Low back pain: Secondary | ICD-10-CM | POA: Diagnosis not present

## 2017-10-09 DIAGNOSIS — M25571 Pain in right ankle and joints of right foot: Secondary | ICD-10-CM | POA: Diagnosis not present

## 2017-10-09 DIAGNOSIS — M9903 Segmental and somatic dysfunction of lumbar region: Secondary | ICD-10-CM | POA: Diagnosis not present

## 2017-10-09 DIAGNOSIS — M4146 Neuromuscular scoliosis, lumbar region: Secondary | ICD-10-CM | POA: Diagnosis not present

## 2017-10-09 DIAGNOSIS — M4326 Fusion of spine, lumbar region: Secondary | ICD-10-CM | POA: Diagnosis not present

## 2017-10-09 DIAGNOSIS — M9901 Segmental and somatic dysfunction of cervical region: Secondary | ICD-10-CM | POA: Diagnosis not present

## 2017-10-09 DIAGNOSIS — M9902 Segmental and somatic dysfunction of thoracic region: Secondary | ICD-10-CM | POA: Diagnosis not present

## 2017-10-09 DIAGNOSIS — M5431 Sciatica, right side: Secondary | ICD-10-CM | POA: Diagnosis not present

## 2017-10-09 DIAGNOSIS — M9905 Segmental and somatic dysfunction of pelvic region: Secondary | ICD-10-CM | POA: Diagnosis not present

## 2017-10-11 DIAGNOSIS — M4146 Neuromuscular scoliosis, lumbar region: Secondary | ICD-10-CM | POA: Diagnosis not present

## 2017-10-11 DIAGNOSIS — M5431 Sciatica, right side: Secondary | ICD-10-CM | POA: Diagnosis not present

## 2017-10-11 DIAGNOSIS — M4326 Fusion of spine, lumbar region: Secondary | ICD-10-CM | POA: Diagnosis not present

## 2017-10-11 DIAGNOSIS — M9903 Segmental and somatic dysfunction of lumbar region: Secondary | ICD-10-CM | POA: Diagnosis not present

## 2017-10-11 DIAGNOSIS — M9902 Segmental and somatic dysfunction of thoracic region: Secondary | ICD-10-CM | POA: Diagnosis not present

## 2017-10-11 DIAGNOSIS — M25571 Pain in right ankle and joints of right foot: Secondary | ICD-10-CM | POA: Diagnosis not present

## 2017-10-11 DIAGNOSIS — M9901 Segmental and somatic dysfunction of cervical region: Secondary | ICD-10-CM | POA: Diagnosis not present

## 2017-10-11 DIAGNOSIS — M545 Low back pain: Secondary | ICD-10-CM | POA: Diagnosis not present

## 2017-10-11 DIAGNOSIS — M9905 Segmental and somatic dysfunction of pelvic region: Secondary | ICD-10-CM | POA: Diagnosis not present

## 2017-10-13 DIAGNOSIS — M9902 Segmental and somatic dysfunction of thoracic region: Secondary | ICD-10-CM | POA: Diagnosis not present

## 2017-10-13 DIAGNOSIS — M9901 Segmental and somatic dysfunction of cervical region: Secondary | ICD-10-CM | POA: Diagnosis not present

## 2017-10-13 DIAGNOSIS — M25571 Pain in right ankle and joints of right foot: Secondary | ICD-10-CM | POA: Diagnosis not present

## 2017-10-13 DIAGNOSIS — M9905 Segmental and somatic dysfunction of pelvic region: Secondary | ICD-10-CM | POA: Diagnosis not present

## 2017-10-13 DIAGNOSIS — M4146 Neuromuscular scoliosis, lumbar region: Secondary | ICD-10-CM | POA: Diagnosis not present

## 2017-10-13 DIAGNOSIS — M9903 Segmental and somatic dysfunction of lumbar region: Secondary | ICD-10-CM | POA: Diagnosis not present

## 2017-10-13 DIAGNOSIS — M545 Low back pain: Secondary | ICD-10-CM | POA: Diagnosis not present

## 2017-10-13 DIAGNOSIS — M5431 Sciatica, right side: Secondary | ICD-10-CM | POA: Diagnosis not present

## 2017-10-13 DIAGNOSIS — M4326 Fusion of spine, lumbar region: Secondary | ICD-10-CM | POA: Diagnosis not present

## 2017-10-18 DIAGNOSIS — M9901 Segmental and somatic dysfunction of cervical region: Secondary | ICD-10-CM | POA: Diagnosis not present

## 2017-10-18 DIAGNOSIS — M4146 Neuromuscular scoliosis, lumbar region: Secondary | ICD-10-CM | POA: Diagnosis not present

## 2017-10-18 DIAGNOSIS — M545 Low back pain: Secondary | ICD-10-CM | POA: Diagnosis not present

## 2017-10-18 DIAGNOSIS — M9905 Segmental and somatic dysfunction of pelvic region: Secondary | ICD-10-CM | POA: Diagnosis not present

## 2017-10-18 DIAGNOSIS — M25571 Pain in right ankle and joints of right foot: Secondary | ICD-10-CM | POA: Diagnosis not present

## 2017-10-18 DIAGNOSIS — M4326 Fusion of spine, lumbar region: Secondary | ICD-10-CM | POA: Diagnosis not present

## 2017-10-18 DIAGNOSIS — M9902 Segmental and somatic dysfunction of thoracic region: Secondary | ICD-10-CM | POA: Diagnosis not present

## 2017-10-18 DIAGNOSIS — M5431 Sciatica, right side: Secondary | ICD-10-CM | POA: Diagnosis not present

## 2017-10-18 DIAGNOSIS — M9903 Segmental and somatic dysfunction of lumbar region: Secondary | ICD-10-CM | POA: Diagnosis not present

## 2017-10-20 DIAGNOSIS — M9901 Segmental and somatic dysfunction of cervical region: Secondary | ICD-10-CM | POA: Diagnosis not present

## 2017-10-20 DIAGNOSIS — M9905 Segmental and somatic dysfunction of pelvic region: Secondary | ICD-10-CM | POA: Diagnosis not present

## 2017-10-20 DIAGNOSIS — M9903 Segmental and somatic dysfunction of lumbar region: Secondary | ICD-10-CM | POA: Diagnosis not present

## 2017-10-20 DIAGNOSIS — M545 Low back pain: Secondary | ICD-10-CM | POA: Diagnosis not present

## 2017-10-20 DIAGNOSIS — M9902 Segmental and somatic dysfunction of thoracic region: Secondary | ICD-10-CM | POA: Diagnosis not present

## 2017-10-20 DIAGNOSIS — M4326 Fusion of spine, lumbar region: Secondary | ICD-10-CM | POA: Diagnosis not present

## 2017-10-20 DIAGNOSIS — M25571 Pain in right ankle and joints of right foot: Secondary | ICD-10-CM | POA: Diagnosis not present

## 2017-10-20 DIAGNOSIS — M4146 Neuromuscular scoliosis, lumbar region: Secondary | ICD-10-CM | POA: Diagnosis not present

## 2017-10-20 DIAGNOSIS — M5431 Sciatica, right side: Secondary | ICD-10-CM | POA: Diagnosis not present

## 2017-11-23 ENCOUNTER — Other Ambulatory Visit: Payer: Self-pay | Admitting: Family Medicine

## 2018-02-08 ENCOUNTER — Other Ambulatory Visit: Payer: Self-pay | Admitting: Family Medicine

## 2018-02-09 DIAGNOSIS — R350 Frequency of micturition: Secondary | ICD-10-CM | POA: Diagnosis not present

## 2018-02-09 DIAGNOSIS — Z01419 Encounter for gynecological examination (general) (routine) without abnormal findings: Secondary | ICD-10-CM | POA: Diagnosis not present

## 2018-02-09 DIAGNOSIS — Z1231 Encounter for screening mammogram for malignant neoplasm of breast: Secondary | ICD-10-CM | POA: Diagnosis not present

## 2018-02-16 ENCOUNTER — Other Ambulatory Visit: Payer: Self-pay | Admitting: Family Medicine

## 2018-02-20 NOTE — Telephone Encounter (Signed)
Needs appt. Please check and if needs AWV please schedule both on the same day in the next 1-2 months (me and susan). Please call pt and schedule appt., THEN can refill until appt. Thanks.

## 2018-02-20 NOTE — Telephone Encounter (Signed)
I called the pt and informed her of the message below.  Appts scheduled for 5/14 and the pt stated she does not need Trazodone at this time.

## 2018-02-28 DIAGNOSIS — H52213 Irregular astigmatism, bilateral: Secondary | ICD-10-CM | POA: Diagnosis not present

## 2018-02-28 DIAGNOSIS — H2513 Age-related nuclear cataract, bilateral: Secondary | ICD-10-CM | POA: Diagnosis not present

## 2018-02-28 DIAGNOSIS — H401131 Primary open-angle glaucoma, bilateral, mild stage: Secondary | ICD-10-CM | POA: Diagnosis not present

## 2018-02-28 DIAGNOSIS — H18713 Corneal ectasia, bilateral: Secondary | ICD-10-CM | POA: Diagnosis not present

## 2018-03-01 DIAGNOSIS — M25572 Pain in left ankle and joints of left foot: Secondary | ICD-10-CM | POA: Diagnosis not present

## 2018-03-02 DIAGNOSIS — M25572 Pain in left ankle and joints of left foot: Secondary | ICD-10-CM | POA: Diagnosis not present

## 2018-03-15 DIAGNOSIS — M810 Age-related osteoporosis without current pathological fracture: Secondary | ICD-10-CM | POA: Diagnosis not present

## 2018-03-15 DIAGNOSIS — M84375A Stress fracture, left foot, initial encounter for fracture: Secondary | ICD-10-CM | POA: Diagnosis not present

## 2018-03-15 DIAGNOSIS — E559 Vitamin D deficiency, unspecified: Secondary | ICD-10-CM | POA: Diagnosis not present

## 2018-03-18 NOTE — Progress Notes (Addendum)
Subjective:   Kelly Petersen is a 72 y.o. female who presents for Medicare Annual (Subsequent) preventive examination.  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  Asked about glycerna and supplemental nutritious drinks   Exercise;  Carries her 22 lb poodle down the steps in home and not sure if this is how she sustained a fx States she is on her feet a lot;  Does her own house work;  [employer] a mile a few days a week but is active      Health Maintenance Due  Topic Date Due  . MAMMOGRAM  01/26/2018   Colonoscopy 12/2013- repeat in 10 years  Had it 4 years ago in [place];  [place] 01/2016 - just had mammogram at the [facility] - through the gyn this year  Dexa 11/2014  -2.0  (taking Evista x 16 years ) Calcium x 2 per day;  2000 mg of D3 ( was told to take K2 and educated )  States she does drink milk   Last year; was going to fup with Dr. Alwyn Pea, but she didn't order it either  Had a Dexa yesterday - [facility]  Just seen GYN Dr. Carlis Abbott        Objective:     Vitals: BP 98/70   Pulse 75   Wt 126 lb 7 oz (57.4 kg)   SpO2 97%   BMI 23.89 kg/m   Body mass index is 23.89 kg/m.  Advanced Directives 03/20/2018 12/22/2016  Does Patient Have a Medical Advance  Directive? Yes Yes    Tobacco Social History   Tobacco Use  Smoking Status Never Smoker  Smokeless Tobacco Never Used     Counseling given: Yes   Clinical Intake:  Past Medical History:  Diagnosis Date  . Adrenal mass ([place]) 01/02/2017   -monitored by endocrinologist in the past -reports told did not need further imaging  . Asthma   . Chicken pox   . Depression   . Eczema   . Endometrial cancer ([place])   . Environmental allergies   . Glaucoma   . Hyperglycemia   . Hyperlipidemia   . Insomnia   . Osteopenia    Past Surgical History:  Procedure Laterality Date  . 2004    . ABDOMINAL HYSTERECTOMY    . BREAST SURGERY     Family History  Problem Relation Age of Onset  . Lung cancer Unknown   . Hyperlipidemia Unknown    Social History   Socioeconomic History  . Marital status: Single    Spouse name: Not on file  . Number of children: Not on file  . Years of education: Not on file  . Highest education level: Not on  file  Occupational History  . Not on file  Social Needs  . Financial resource strain: Not on file  . Food insecurity:    Worry: Not on file    Inability: Not on file  . Transportation needs:    Medical: Not on file    Non-medical: Not on file  Tobacco Use  . Smoking status: Never Smoker  . Smokeless tobacco: Never Used  Substance and Sexual Activity  . Alcohol use: Yes    Comment: 2 drink rarely  . Drug use: No  . Sexual activity: Not on file  Lifestyle  . Physical activity:    Days per week: Not on file    Minutes per session: Not on file  . Stress: Not on file  Relationships  . Social connections:    Talks on phone: Not on file    Gets together: Not on file    Attends religious service: Not on file    Active member of club or organization: Not on file    Attends meetings of clubs or organizations: Not on file    Relationship status: Not on file  Other Topics Concern  . Not on file  Social History Narrative   Work or School: cares  for grandchild      Home Situation: lives alone      Spiritual Beliefs: Kelly Petersen, no church currently      Lifestyle: no regular exercise; diet is poor       Outpatient Encounter Medications as of 03/20/2018  Medication Sig  . ASMANEX 120 METERED DOSES 220 MCG/INH inhaler USE 2 INHALATIONS TWICE A  DAY  . Brimonidine Tartrate-Timolol (COMBIGAN OP) Apply 1 drop to eye 2 (two) times daily.  . clobetasol (TEMOVATE) 0.05 % external solution Apply 1 application topically as needed.  . montelukast (SINGULAIR) 10 MG tablet TAKE 1 TABLET BY MOUTH  EVERY NIGHT AT BEDTIME  . Pseudoephedrine-Ibuprofen (ADVIL COLD/SINUS PO) Take by mouth as needed.  . raloxifene (EVISTA) 60 MG tablet Take 60 mg by mouth daily.  . rosuvastatin (CRESTOR) 5 MG tablet TAKE 1 TABLET BY MOUTH  DAILY  . sertraline (ZOLOFT) 25 MG tablet TAKE 1 TABLET BY MOUTH  DAILY  . traZODone (DESYREL) 50 MG tablet TAKE 1 TABLET BY MOUTH AT  BEDTIME   No facility-administered encounter medications on file as of 03/20/2018.     Activities of Daily Living In your present state of health, do you have any difficulty performing the following activities: 03/20/2018  Hearing? Y  Comment minor issues  Vision? N  Difficulty concentrating or making decisions? N  Walking or climbing stairs? N  Dressing or bathing? N  Doing errands, shopping? N  Preparing Food and eating ? N  Using the Toilet? N  In the past six months, have you accidently leaked urine? N  Do you have problems with loss of bowel control? N  Managing your Medications? N  Managing your Finances? N  Housekeeping or managing your Housekeeping? N  Some recent data might be hidden    Patient Care Team: Lucretia Kern, DO as PCP - General (Family Medicine) Sanjuana Kava, MD as Referring Physician (Obstetrics and Gynecology) Hillery Jacks, MD as Referring Physician (Dermatology)    Assessment:   This is a routine wellness examination for Kelly Petersen.  Exercise Activities and  Dietary recommendations Current Exercise Habits: Home exercise routine, Type of exercise: walking;strength training/weights(walks 2 days a week; house cleaning and other activiites), Time (Minutes): 60, Frequency (Times/Week): 3, Weekly  Exercise (Minutes/Week): 180, Intensity: Mild  Goals    . Patient Stated     fx to heal and go on cruise this year        Fall Risk Fall Risk  03/20/2018 12/22/2016 08/19/2015 08/08/2014  Falls in the past year? No (No Data) No Yes  Comment - slipped x 1;  - -  Number falls in past yr: - - - 1  Injury with Fall? - - - Yes     Depression Screen PHQ 2/9 Scores 03/20/2018 12/22/2016 08/19/2015 08/08/2014  PHQ - 2 Score 0 0 0 0     Cognitive Function     Ad8 score reviewed for issues:  Issues making decisions:  Less interest in hobbies / activities:  Repeats questions, stories (family complaining):  Trouble using ordinary gadgets (microwave, computer, phone):  Forgets the month or year:   Mismanaging finances:   Remembering appts:  Daily problems with thinking and/or memory: Ad8 score is=0     6CIT Screen 03/20/2018  What Year? 0 points  What month? 0 points  What time? 0 points  Count back from 20 0 points  Months in reverse 0 points  Repeat phrase 0 points  Total Score 0    Immunization History  Administered Date(s) Administered  . Influenza Split 06/27/2013  . Influenza, High Dose Seasonal PF 08/08/2014, 08/03/2015, 07/18/2016, 08/21/2017  . Pneumococcal Conjugate-13 08/08/2014  . Pneumococcal Polysaccharide-23 05/15/2003, 11/07/2008, 12/09/2015  . Td 05/11/2003  . Tdap 11/07/2009  . Zoster 11/15/2011     Screening Tests Health Maintenance  Topic Date Due  . MAMMOGRAM  01/26/2018  . INFLUENZA VACCINE  06/07/2018  . TETANUS/TDAP  11/08/2019  . COLONOSCOPY  12/24/2023  . DEXA SCAN  Completed  . PNA vac Low Risk Adult  Completed  . Hepatitis C Screening  Addressed       Plan:      PCP Notes   Health  Maintenance Doing well in general  Mammogram completed this week at [facility] ordered and completed at [facility], but states Gyn referred treatment back to Dr. Maudie Mercury ( [facility] left Dr. Alwyn Pea)   States lifescreen told her she was pre-diabetic ; hx of hypoglycemia in the family  Given information regarding the shingrix vaccine   Abnormal Screens  Hearing 2000 hz  Referrals  none  Patient concerns; As noted   Nurse Concerns; Has urinary frequency 2 times a night   Endocrinologist in [place]; followed Adrenal mass; does a blood test every year / last check 2015 per her report today   To note; Dr. Maudie Mercury has requested the patient follow up with her orthopedic who ordered the bone density at her  Next PCP apt Today 03/20/2018 visit       I have personally reviewed and noted the following in the patient's chart:   . Medical and social history . Use of alcohol, tobacco or illicit drugs  . Current medications and supplements . Functional ability and status . Nutritional status . Physical activity . Advanced directives . List of other physicians . Hospitalizations, surgeries, and ER visits in previous 12 months . Vitals . Screenings to include cognitive, depression, and falls . Referrals and appointments  In addition, I have reviewed and discussed with patient certain preventive protocols, quality metrics, and best practice recommendations. A written personalized care plan for preventive services as well as general preventive health recommendations were provided to patient.     Wynetta Fines, RN  03/20/2018

## 2018-03-19 ENCOUNTER — Encounter: Payer: Self-pay | Admitting: Family Medicine

## 2018-03-19 DIAGNOSIS — M8589 Other specified disorders of bone density and structure, multiple sites: Secondary | ICD-10-CM | POA: Diagnosis not present

## 2018-03-19 DIAGNOSIS — Z780 Asymptomatic menopausal state: Secondary | ICD-10-CM | POA: Diagnosis not present

## 2018-03-19 NOTE — Progress Notes (Signed)
HPI:  Using dictation device. Unfortunately this device frequently misinterprets words/phrases.  Kelly Petersen is a pleasant 72 y.o. here for follow up. Chronic medical problems summarized below were reviewed for changes and stability and were updated as needed below. These issues and their treatment remain stable for the most part.  Reports her mood has been good.  She continues to take the SSRI.  She uses trazodone only 1-2 times a month when she cannot sleep well. She wants to check her sugar and cholesterol labs today.  She was getting regular exercise, but recently fractured her foot.  She reports her diet is terrible. She is seeing Dr. Layne Petersen about the foot fracture.  Reports Dr. Layne Petersen did order labs and a bone density test which she had yesterday and also recommended some vitamins for her including vitamin D She has some questions regarding an old adrenal mass.  She reports this was evaluated after being found incidentally on a CT scan with her gynecologist before she moved to [place].  In the past she had reported a endocrinologist was following her for this.  However today she reports her primary care doctor checked a lab every year for this.  She does not recall further imaging or other types of testing.  She now wonders if more testing is needed or if she needs further follow-up. She seems concerned about this and reports knowing of a case of adrenal tumor that caused long eyelashes and she check lashes and she is gald they are not long.  She does not have any new symptoms other than the foot fracture.  She does have a history of mild hyperglycemia and hyperlipidemia,  not surprising given her diet. Denies new CP, SOB, DOE, treatment intolerance or new symptoms.  Reports is always had a little bit of shortness of breath with exercise. Due for mammo -reports she did this with her gynecologist a few weeks ago , labs, AWV -did her annual wellness visit with Kelly Petersen today.  Asthma and  Allergens: -dx in 1998 -never hospitalized -stable  Hyperlipidemia/Prediabetes: -no regular aerobic exercise; poor diet -on crestor 5mg  for a long time   Hx of Depression/Insomnia: - on zoloft low dose -takes low dose of trazadone prn a few times per month -denies: SI, depression, hx of hospitalization  Eczema: -sees skin surgery center, uses clobetasol sol  Osteopenia: -sees gyn for this, Dr. Alwyn Petersen -hx uterine ca -taking Evista since hysterectomy - always rxed by gyn -did dexa with gyn in the past, did DEXA with ortho, Dr. Cyd Petersen in 03/2018   ROS: See pertinent positives and negatives per HPI.  Past Medical History:  Diagnosis Date  . Adrenal mass ([place]) 01/02/2017   -monitored by endocrinologist in the past -reports told did not need further imaging  . Asthma   . Chicken pox   . Depression   . Eczema   . Endometrial cancer ([place])   . Environmental allergies   . Glaucoma   . Hyperglycemia   . Hyperlipidemia   . Insomnia   . Osteopenia     Past Surgical History:  Procedure Laterality Date  . 2004    . ABDOMINAL HYSTERECTOMY    . BREAST SURGERY      Family History  Problem Relation Age of Onset  . Lung cancer Unknown   . Hyperlipidemia Unknown     SOCIAL HX: see hpi   Current Outpatient Medications:  .  ASMANEX 120 METERED DOSES 220 MCG/INH inhaler, USE 2 INHALATIONS TWICE A  DAY, Disp: 1 Inhaler, Rfl: 0 .  Brimonidine Tartrate-Timolol (COMBIGAN OP), Apply 1 drop to eye 2 (two) times daily., Disp: , Rfl:  .  clobetasol (TEMOVATE) 0.05 % external solution, Apply 1 application topically as needed., Disp: , Rfl: 0 .  montelukast (SINGULAIR) 10 MG tablet, TAKE 1 TABLET BY MOUTH  EVERY NIGHT AT BEDTIME, Disp: 90 tablet, Rfl: 0 .  Pseudoephedrine-Ibuprofen (ADVIL COLD/SINUS PO), Take by mouth as needed., Disp: , Rfl:  .  raloxifene (EVISTA) 60 MG tablet, Take 60 mg by mouth daily., Disp: , Rfl:  .  rosuvastatin (CRESTOR) 5 MG tablet, Take 1 tablet (5 mg  total) by mouth daily., Disp: 90 tablet, Rfl: 1 .  sertraline (ZOLOFT) 25 MG tablet, Take 1 tablet (25 mg total) by mouth daily., Disp: 90 tablet, Rfl: 1 .  traZODone (DESYREL) 50 MG tablet, TAKE 1 TABLET BY MOUTH AT  BEDTIME, Disp: 90 tablet, Rfl: 3  EXAM:  Vitals:   03/20/18 1145  BP: 98/70  Pulse: 75  SpO2: 97%    Body mass index is 23.89 kg/m.  GENERAL: vitals reviewed and listed above, alert, oriented, appears well hydrated and in no acute distress  HEENT: atraumatic, conjunttiva clear, no obvious abnormalities on inspection of external nose and ears  NECK: no obvious masses on inspection  LUNGS: clear to auscultation bilaterally, no wheezes, rales or rhonchi, good air movement  CV: HRRR, no peripheral edema  MS: moves all extremities without noticeable abnormality - cam walker on L foot  PSYCH: pleasant and cooperative, no obvious depression or anxiety  ASSESSMENT AND PLAN:  Discussed the following assessment and plan:  MDD (recurrent major depressive disorder) in remission ([place])  Hyperlipidemia, unspecified hyperlipidemia type - Plan: Lipid panel  Hyperglycemia - Plan: Hemoglobin A1c  Osteopenia, unspecified location  history of Adrenal mass ([facility])  -refilled medications for depression/sleep -labs per orders and recommended healthy diet and exercise once foot better (see pt instruct) -she will follow up with Dr. Cyd Petersen about her bone Density and fracture -It is unclear what evaluation she had for the adrenal mass remotely and her hx today is different then documented history in the past - she has a lot of question about this and monitoring with blood tests; opted to have her see endocrinology for recommendations to help answer her questions/concerns. -Patient advised to return or notify a doctor immediately if symptoms worsen or persist or new concerns arise.  Patient Instructions  BEFORE YOU LEAVE: -obtain mammogram report -labs -follow up: 6  months  I'll send a referral to endocrine about the history of the adrenal mass as I am not sure what the best type of follow up would be at this point. We will see what they have to recommend.  Follow up with Dr. Layne Petersen about the bone density test and treatment, Evista.   We have ordered labs or studies at this visit. It can take up to 1-2 weeks for results and processing. IF results require follow up or explanation, we will call you with instructions. Clinically stable results will be released to your [facility]. If you have not heard from [place] or cannot find your results in [facility] in 2 weeks please contact our office at [phone].  If you are not yet signed up for [facility], please consider signing up.   We recommend the following healthy lifestyle for LIFE: 1) Small portions. But, make sure to get regular (at least 3 per day), healthy meals and small healthy snacks if needed.  2)  Eat a healthy clean diet.   TRY TO EAT: -at least 5-7 servings of low sugar, colorful, and nutrient rich vegetables per day (not corn, potatoes or bananas.) -berries are the best choice if you wish to eat fruit (only eat small amounts if trying to reduce weight)  -lean meets (fish, white meat of chicken or [place]) -vegan proteins for some meals - beans or tofu, whole grains, nuts and seeds -Replace bad fats with good fats - good fats include: fish, nuts and seeds, canola oil, olive oil -small amounts of low fat or non fat dairy -small amounts of100 % whole grains - check the lables -drink plenty of water  AVOID: -SUGAR, sweets, anything with added sugar, corn syrup or sweeteners - must read labels as even foods advertised as "healthy" often are loaded with sugar -if you must have a sweetener, small amounts of stevia may be best -sweetened beverages and artificially sweetened beverages -simple starches (rice, bread, potatoes, pasta, chips, etc - small amounts of 100% whole grains are ok) -red meat, pork,  butter -fried foods, fast food, processed food, excessive dairy, eggs and coconut.  3)Get at least 150 minutes of sweaty aerobic exercise per week.  4)Reduce stress - consider counseling, meditation and relaxation to balance other aspects of your life.        Lucretia Kern, DO

## 2018-03-20 ENCOUNTER — Ambulatory Visit: Payer: Medicare HMO | Admitting: Family Medicine

## 2018-03-20 ENCOUNTER — Telehealth: Payer: Self-pay

## 2018-03-20 ENCOUNTER — Ambulatory Visit (INDEPENDENT_AMBULATORY_CARE_PROVIDER_SITE_OTHER): Payer: Medicare HMO

## 2018-03-20 ENCOUNTER — Encounter: Payer: Self-pay | Admitting: Family Medicine

## 2018-03-20 VITALS — BP 98/70 | HR 75 | Ht 61.0 in | Wt 126.4 lb

## 2018-03-20 DIAGNOSIS — E278 Other specified disorders of adrenal gland: Secondary | ICD-10-CM

## 2018-03-20 DIAGNOSIS — M8580 Other specified disorders of bone density and structure, unspecified site: Secondary | ICD-10-CM | POA: Diagnosis not present

## 2018-03-20 DIAGNOSIS — R739 Hyperglycemia, unspecified: Secondary | ICD-10-CM

## 2018-03-20 DIAGNOSIS — Z0000 Encounter for general adult medical examination without abnormal findings: Secondary | ICD-10-CM

## 2018-03-20 DIAGNOSIS — E279 Disorder of adrenal gland, unspecified: Secondary | ICD-10-CM

## 2018-03-20 DIAGNOSIS — E785 Hyperlipidemia, unspecified: Secondary | ICD-10-CM

## 2018-03-20 DIAGNOSIS — R69 Illness, unspecified: Secondary | ICD-10-CM | POA: Diagnosis not present

## 2018-03-20 DIAGNOSIS — F3340 Major depressive disorder, recurrent, in remission, unspecified: Secondary | ICD-10-CM | POA: Diagnosis not present

## 2018-03-20 LAB — LIPID PANEL
CHOL/HDL RATIO: 3
Cholesterol: 165 mg/dL (ref 0–200)
HDL: 53.6 mg/dL (ref >39.00)
NonHDL: 111.32
Triglycerides: 249 mg/dL — ABNORMAL HIGH (ref 0.0–149.0)
VLDL: 49.8 mg/dL — AB (ref 0.0–40.0)

## 2018-03-20 LAB — HEMOGLOBIN A1C: HEMOGLOBIN A1C: 6 % (ref 4.6–6.5)

## 2018-03-20 LAB — LDL CHOLESTEROL, DIRECT: LDL DIRECT: 88 mg/dL

## 2018-03-20 MED ORDER — SERTRALINE HCL 25 MG PO TABS: 25.0000 mg | ORAL_TABLET | Freq: Every day | ORAL | 1 refills | Status: DC

## 2018-03-20 MED ORDER — ROSUVASTATIN CALCIUM 5 MG PO TABS: 5.0000 mg | ORAL_TABLET | Freq: Every day | ORAL | 1 refills | Status: DC

## 2018-03-20 NOTE — Patient Instructions (Addendum)
Kelly Petersen , Thank you for taking time to come for your Medicare Wellness Visit. I appreciate your ongoing commitment to your health goals. Please review the following plan we discussed and let me know if I can assist you in the future.   Now there is a new shingles vaccine called the shingrix. Shingrix is a vaccine for the prevention of Shingles in Adults 50 and older.  If you are on Medicare, the shingrix is covered under your Part D plan, so you will take both of the vaccines in the series at your pharmacy. Please check with your benefits regarding applicable copays or out of pocket expenses.  The Shingrix is given in 2 vaccines approx 8 weeks apart. You must receive the 2nd dose prior to 6 months from receipt of the first. Please have the pharmacist print out you Immunization  dates for our office records    Dr. Maudie Mercury to check heel today due to swelling but followed by orthopedic  Blood test Dr. Hal Morales last Thursday; checking your calcium level ; dr. Maudie Mercury will probably check BMP panel which would calcium in it   [facility] to send dexa to Dr. Maudie Mercury. Manuela Schwartz will make a note to call [facility] next week or check to be sure we rec'd your notes  [facility]ogram at [facility] and they will forward to Dr. Maudie Mercury  May want to have a hearing screen;  [facility] [place] - can assist with hearing aid x 1  No reviews  [facility]  [address]  [phone]  [email or web address]   These are the goals we discussed: Goals    . Patient Stated     fx to heal and go on cruise this year        This is a list of the screening recommended for you and due dates:  Health Maintenance  Topic Date Due  . Mammogram  01/26/2018  . Flu Shot  06/07/2018  . Tetanus Vaccine  11/08/2019  . Colon Cancer Screening  12/24/2023  . DEXA scan (bone density measurement)  Completed  .  Pneumonia vaccines  Completed  .  Hepatitis C: One time screening is recommended by Center for Disease Control  (CDC) for  adults born from [birth year] through 1967.   Addressed      Fall Prevention in the Home Falls can cause injuries. They can happen to people of all ages. There are many things you can do to make your home safe and to help prevent falls. What can I do on the outside of my home?  Regularly fix the edges of walkways and driveways and fix any cracks.  Remove anything that might make you trip as you walk through a door, such as a raised step or threshold.  Trim any bushes or trees on the path to your home.  Use bright outdoor lighting.  Clear any walking paths of anything that might make someone trip, such as rocks or tools.  Regularly check to see if handrails are loose or broken. Make sure that both sides of any steps have handrails.  Any raised decks and porches should have guardrails on the edges.  Have any leaves, snow, or ice cleared regularly.  Use sand or salt on walking paths during winter.  Clean up any spills in your garage right away. This includes oil or grease spills. What can I do in the bathroom?  Use night lights.  Install grab bars by  the toilet and in the tub and shower. Do not use towel bars as grab bars.  Use non-skid mats or decals in the tub or shower.  If you need to sit down in the shower, use a plastic, non-slip stool.  Keep the floor dry. Clean up any water that spills on the floor as soon as it happens.  Remove soap buildup in the tub or shower regularly.  Attach bath mats securely with double-sided non-slip rug tape.  Do not have throw rugs and other things on the floor that can make you trip. What can I do in the bedroom?  Use night lights.  Make sure that you have a light by your bed that is easy to reach.  Do not use any sheets or blankets that are too big for your bed. They should not hang down onto the floor.  Have a firm  chair that has side arms. You can use this for support while you get dressed.  Do not have throw rugs and other things on the floor that can make you trip. What can I do in the kitchen?  Clean up any spills right away.  Avoid walking on wet floors.  Keep items that you use a lot in easy-to-reach places.  If you need to reach something above you, use a strong step stool that has a grab bar.  Keep electrical cords out of the way.  Do not use floor polish or wax that makes floors slippery. If you must use wax, use non-skid floor wax.  Do not have throw rugs and other things on the floor that can make you trip. What can I do with my stairs?  Do not leave any items on the stairs.  Make sure that there are handrails on both sides of the stairs and use them. Fix handrails that are broken or loose. Make sure that handrails are as long as the stairways.  Check any carpeting to make sure that it is firmly attached to the stairs. Fix any carpet that is loose or worn.  Avoid having throw rugs at the top or bottom of the stairs. If you do have throw rugs, attach them to the floor with carpet tape.  Make sure that you have a light switch at the top of the stairs and the bottom of the stairs. If you do not have them, ask someone to add them for you. What else can I do to help prevent falls?  Wear shoes that: ? Do not have high heels. ? Have rubber bottoms. ? Are comfortable and fit you well. ? Are closed at the toe. Do not wear sandals.  If you use a stepladder: ? Make sure that it is fully opened. Do not climb a closed stepladder. ? Make sure that both sides of the stepladder are locked into place. ? Ask someone to hold it for you, if possible.  Clearly mark and make sure that you can see: ? Any grab bars or handrails. ? First and last steps. ? Where the edge of each step is.  Use tools that help you move around (mobility aids) if they are needed. These  include: ? Canes. ? Walkers. ? Scooters. ? Crutches.  Turn on the lights when you go into a dark area. Replace any light bulbs as soon as they burn out.  Set up your furniture so you have a clear path. Avoid moving your furniture around.  If any of your floors are uneven, fix them.  If there are any pets around you, be aware of where they are.  Review your medicines with your doctor. Some medicines can make you feel dizzy. This can increase your chance of falling. Ask your doctor what other things that you can do to help prevent falls. This information is not intended to replace advice given to you by your health care provider. Make sure you discuss any questions you have with your health care provider. Document Released: 08/20/2009 Document Revised: 03/31/2016 Document Reviewed: 11/28/2014 Elsevier Interactive Patient Education  2018 [place] Maintenance, Female Adopting a healthy lifestyle and getting preventive care can go a long way to promote health and wellness. Talk with your health care provider about what schedule of regular examinations is right for you. This is a good chance for you to check in with your provider about disease prevention and staying healthy. In between checkups, there are plenty of things you can do on your own. Experts have done a lot of research about which lifestyle changes and preventive measures are most likely to keep you healthy. Ask your health care provider for more information. Weight and diet Eat a healthy diet  Be sure to include plenty of vegetables, fruits, low-fat dairy products, and lean protein.  Do not eat a lot of foods high in solid fats, added sugars, or salt.  Get regular exercise. This is one of the most important things you can do for your health. ? Most adults should exercise for at least 150 minutes each week. The exercise should increase your heart rate and make you sweat (moderate-intensity exercise). ? Most adults  should also do strengthening exercises at least twice a week. This is in addition to the moderate-intensity exercise.  Maintain a healthy weight  Body mass index (BMI) is a measurement that can be used to identify possible weight problems. It estimates body fat based on height and weight. Your health care provider can help determine your BMI and help you achieve or maintain a healthy weight.  For females 33 years of age and older: ? A BMI below 18.5 is considered underweight. ? A BMI of 18.5 to 24.9 is normal. ? A BMI of 25 to 29.9 is considered overweight. ? A BMI of 30 and above is considered obese.  Watch levels of cholesterol and blood lipids  You should start having your blood tested for lipids and cholesterol at 74 years of age, then have this test every 5 years.  You may need to have your cholesterol levels checked more often if: ? Your lipid or cholesterol levels are high. ? You are older than 74 years of age. ? You are at high risk for heart disease.  Cancer screening Lung Cancer  Lung cancer screening is recommended for adults 9-26 years old who are at high risk for lung cancer because of a history of smoking.  A yearly low-dose CT scan of the lungs is recommended for people who: ? Currently smoke. ? Have quit within the past 15 years. ? Have at least a 30-pack-year history of smoking. A pack year is smoking an average of one pack of cigarettes a day for 1 year.  Yearly screening should continue until it has been 15 years since you quit.  Yearly screening should stop if you develop a health problem that would prevent you from having lung cancer treatment.  Breast Cancer  Practice breast self-awareness. This means understanding how your breasts normally appear and feel.  It also means doing  regular breast self-exams. Let your health care provider know about any changes, no matter how small.  If you are in your 20s or 30s, you should have a clinical breast exam (CBE)  by a health care provider every 1-3 years as part of a regular health exam.  If you are 8 or older, have a CBE every year. Also consider having a breast X-ray (mammogram) every year.  If you have a family history of breast cancer, talk to your health care provider about genetic screening.  If you are at high risk for breast cancer, talk to your health care provider about having an MRI and a mammogram every year.  Breast cancer gene (BRCA) assessment is recommended for women who have family members with BRCA-related cancers. BRCA-related cancers include: ? Breast. ? Ovarian. ? Tubal. ? Peritoneal cancers.  Results of the assessment will determine the need for genetic counseling and BRCA1 and BRCA2 testing.  Cervical Cancer Your health care provider may recommend that you be screened regularly for cancer of the pelvic organs (ovaries, uterus, and vagina). This screening involves a pelvic examination, including checking for microscopic changes to the surface of your cervix (Pap test). You may be encouraged to have this screening done every 3 years, beginning at age 60.  For women ages 13-65, health care providers may recommend pelvic exams and Pap testing every 3 years, or they may recommend the Pap and pelvic exam, combined with testing for human papilloma virus (HPV), every 5 years. Some types of HPV increase your risk of cervical cancer. Testing for HPV may also be done on women of any age with unclear Pap test results.  Other health care providers may not recommend any screening for nonpregnant women who are considered low risk for pelvic cancer and who do not have symptoms. Ask your health care provider if a screening pelvic exam is right for you.  If you have had past treatment for cervical cancer or a condition that could lead to cancer, you need Pap tests and screening for cancer for at least 20 years after your treatment. If Pap tests have been discontinued, your risk factors (such as  having a new sexual partner) need to be reassessed to determine if screening should resume. Some women have medical problems that increase the chance of getting cervical cancer. In these cases, your health care provider may recommend more frequent screening and Pap tests.  Colorectal Cancer  This type of cancer can be detected and often prevented.  Routine colorectal cancer screening usually begins at 74 years of age and continues through 74 years of age.  Your health care provider may recommend screening at an earlier age if you have risk factors for colon cancer.  Your health care provider may also recommend using home test kits to check for hidden blood in the stool.  A small camera at the end of a tube can be used to examine your colon directly (sigmoidoscopy or colonoscopy). This is done to check for the earliest forms of colorectal cancer.  Routine screening usually begins at age 37.  Direct examination of the colon should be repeated every 5-10 years through 74 years of age. However, you may need to be screened more often if early forms of precancerous polyps or small growths are found.  Skin Cancer  Check your skin from head to toe regularly.  Tell your health care provider about any new moles or changes in moles, especially if there is a change in a mole's  shape or color.  Also tell your health care provider if you have a mole that is larger than the size of a pencil eraser.  Always use sunscreen. Apply sunscreen liberally and repeatedly throughout the day.  Protect yourself by wearing long sleeves, pants, a wide-brimmed hat, and sunglasses whenever you are outside.  Heart disease, diabetes, and high blood pressure  High blood pressure causes heart disease and increases the risk of stroke. High blood pressure is more likely to develop in: ? People who have blood pressure in the high end of the normal range (130-139/85-89 mm Hg). ? People who are overweight or  obese. ? People who are African American.  If you are 3-37 years of age, have your blood pressure checked every 3-5 years. If you are 3 years of age or older, have your blood pressure checked every year. You should have your blood pressure measured twice-once when you are at a hospital or clinic, and once when you are not at a hospital or clinic. Record the average of the two measurements. To check your blood pressure when you are not at a hospital or clinic, you can use: ? An automated blood pressure machine at a pharmacy. ? A home blood pressure monitor.  If you are between 54 years and 23 years old, ask your health care provider if you should take aspirin to prevent strokes.  Have regular diabetes screenings. This involves taking a blood sample to check your fasting blood sugar level. ? If you are at a normal weight and have a low risk for diabetes, have this test once every three years after 74 years of age. ? If you are overweight and have a high risk for diabetes, consider being tested at a younger age or more often. Preventing infection Hepatitis B  If you have a higher risk for hepatitis B, you should be screened for this virus. You are considered at high risk for hepatitis B if: ? You were born in a country where hepatitis B is common. Ask your health care provider which countries are considered high risk. ? Your parents were born in a high-risk country, and you have not been immunized against hepatitis B (hepatitis B vaccine). ? You have HIV or AIDS. ? You use needles to inject street drugs. ? You live with someone who has hepatitis B. ? You have had sex with someone who has hepatitis B. ? You get hemodialysis treatment. ? You take certain medicines for conditions, including cancer, organ transplantation, and autoimmune conditions.  Hepatitis C  Blood testing is recommended for: ? Everyone born from [birth year] through 1967. ? Anyone with known risk factors for hepatitis  C.  Sexually transmitted infections (STIs)  You should be screened for sexually transmitted infections (STIs) including gonorrhea and chlamydia if: ? You are sexually active and are younger than 74 years of age. ? You are older than 74 years of age and your health care provider tells you that you are at risk for this type of infection. ? Your sexual activity has changed since you were last screened and you are at an increased risk for chlamydia or gonorrhea. Ask your health care provider if you are at risk.  If you do not have HIV, but are at risk, it may be recommended that you take a prescription medicine daily to prevent HIV infection. This is called pre-exposure prophylaxis (PrEP). You are considered at risk if: ? You are sexually active and do not regularly use condoms or know  the HIV status of your partner(s). ? You take drugs by injection. ? You are sexually active with a partner who has HIV.  Talk with your health care provider about whether you are at high risk of being infected with HIV. If you choose to begin PrEP, you should first be tested for HIV. You should then be tested every 3 months for as long as you are taking PrEP. Pregnancy  If you are premenopausal and you may become pregnant, ask your health care provider about preconception counseling.  If you may become pregnant, take 400 to 800 micrograms (mcg) of folic acid every day.  If you want to prevent pregnancy, talk to your health care provider about birth control (contraception). Osteoporosis and menopause  Osteoporosis is a disease in which the bones lose minerals and strength with aging. This can result in serious bone fractures. Your risk for osteoporosis can be identified using a bone density scan.  If you are 6 years of age or older, or if you are at risk for osteoporosis and fractures, ask your health care provider if you should be screened.  Ask your health care provider whether you should take a calcium or  vitamin D supplement to lower your risk for osteoporosis.  Menopause may have certain physical symptoms and risks.  Hormone replacement therapy may reduce some of these symptoms and risks. Talk to your health care provider about whether hormone replacement therapy is right for you. Follow these instructions at home:  Schedule regular health, dental, and eye exams.  Stay current with your immunizations.  Do not use any tobacco products including cigarettes, chewing tobacco, or electronic cigarettes.  If you are pregnant, do not drink alcohol.  If you are breastfeeding, limit how much and how often you drink alcohol.  Limit alcohol intake to no more than 1 drink per day for nonpregnant women. One drink equals 12 ounces of beer, 5 ounces of wine, or 1 ounces of hard liquor.  Do not use street drugs.  Do not share needles.  Ask your health care provider for help if you need support or information about quitting drugs.  Tell your health care provider if you often feel depressed.  Tell your health care provider if you have ever been abused or do not feel safe at home. This information is not intended to replace advice given to you by your health care provider. Make sure you discuss any questions you have with your health care provider. Document Released: 05/09/2011 Document Revised: 03/31/2016 Document Reviewed: 07/28/2015 Elsevier Interactive Patient Education  2018 [employer].   Hearing Loss Hearing loss is a partial or total loss of the ability to hear. This can be temporary or permanent, and it can happen in one or both ears. Hearing loss may be referred to as deafness. Medical care is necessary to treat hearing loss properly and to prevent the condition from getting worse. Your hearing may partially or completely come back, depending on what caused your hearing loss and how severe it is. In some cases, hearing loss is permanent. What are the causes? Common causes of hearing  loss include:  Too much wax in the ear canal.  Infection of the ear canal or middle ear.  Fluid in the middle ear.  Injury to the ear or surrounding area.  An object stuck in the ear.  Prolonged exposure to loud sounds, such as music.  Less common causes of hearing loss include:  Tumors in the ear.  Viral or  bacterial infections, such as meningitis.  A hole in the eardrum (perforated eardrum).  Problems with the hearing nerve that sends signals between the brain and the ear.  Certain medicines.  What are the signs or symptoms? Symptoms of this condition may include:  Difficulty telling the difference between sounds.  Difficulty following a conversation when there is background noise.  Lack of response to sounds in your environment. This may be most noticeable when you do not respond to startling sounds.  Needing to turn up the volume on the television, radio, etc.  Ringing in the ears.  Dizziness.  Pain in the ears.  How is this diagnosed? This condition is diagnosed based on a physical exam and a hearing test (audiometry). The audiometry test will be performed by a hearing specialist (audiologist). You may also be referred to an ear, nose, and throat (ENT) specialist (otolaryngologist). How is this treated? Treatment for recent onset of hearing loss may include:  Ear wax removal.  Being prescribed medicines to prevent infection (antibiotics).  Being prescribed medicines to reduce inflammation (corticosteroids).  Follow these instructions at home:  If you were prescribed an antibiotic medicine, take it as told by your health care provider. Do not stop taking the antibiotic even if you start to feel better.  Take over-the-counter and prescription medicines only as told by your health care provider.  Avoid loud noises.  Return to your normal activities as told by your health care provider. Ask your health care provider what activities are safe for  you.  Keep all follow-up visits as told by your health care provider. This is important. Contact a health care provider if:  You feel dizzy.  You develop new symptoms.  You vomit or feel nauseous.  You have a fever. Get help right away if:  You develop sudden changes in your vision.  You have severe ear pain.  You have new or increased weakness.  You have a severe headache. This information is not intended to replace advice given to you by your health care provider. Make sure you discuss any questions you have with your health care provider. Document Released: 10/24/2005 Document Revised: 03/31/2016 Document Reviewed: 03/11/2015 Elsevier Interactive Patient Education  2018 [employer].

## 2018-03-20 NOTE — Patient Instructions (Addendum)
BEFORE YOU LEAVE: -obtain mammogram report -labs -follow up: 6 months  I'll send a referral to endocrine about the history of the adrenal mass as I am not sure what the best type of follow up would be at this point. We will see what they have to recommend.  Follow up with Dr. Layne Benton about the bone density test and treatment, Evista.   We have ordered labs or studies at this visit. It can take up to 1-2 weeks for results and processing. IF results require follow up or explanation, we will call you with instructions. Clinically stable results will be released to your [facility]. If you have not heard from [place] or cannot find your results in [facility] in 2 weeks please contact our office at [phone].  If you are not yet signed up for [facility], please consider signing up.   We recommend the following healthy lifestyle for LIFE: 1) Small portions. But, make sure to get regular (at least 3 per day), healthy meals and small healthy snacks if needed.  2) Eat a healthy clean diet.   TRY TO EAT: -at least 5-7 servings of low sugar, colorful, and nutrient rich vegetables per day (not corn, potatoes or bananas.) -berries are the best choice if you wish to eat fruit (only eat small amounts if trying to reduce weight)  -lean meets (fish, white meat of chicken or [place]) -vegan proteins for some meals - beans or tofu, whole grains, nuts and seeds -Replace bad fats with good fats - good fats include: fish, nuts and seeds, canola oil, olive oil -small amounts of low fat or non fat dairy -small amounts of100 % whole grains - check the lables -drink plenty of water  AVOID: -SUGAR, sweets, anything with added sugar, corn syrup or sweeteners - must read labels as even foods advertised as "healthy" often are loaded with sugar -if you must have a sweetener, small amounts of stevia may be best -sweetened beverages and artificially sweetened beverages -simple starches (rice, bread, potatoes, pasta, chips, etc -  small amounts of 100% whole grains are ok) -red meat, pork, butter -fried foods, fast food, processed food, excessive dairy, eggs and coconut.  3)Get at least 150 minutes of sweaty aerobic exercise per week.  4)Reduce stress - consider counseling, meditation and relaxation to balance other aspects of your life.

## 2018-03-20 NOTE — Progress Notes (Signed)
Kelly Petersen Kelly Aamani Moose, DO  

## 2018-03-20 NOTE — Telephone Encounter (Signed)
In for [facility] to complete dexa and will fup to confirm report sent.

## 2018-03-22 ENCOUNTER — Other Ambulatory Visit: Payer: Self-pay | Admitting: Family Medicine

## 2018-03-22 DIAGNOSIS — E278 Other specified disorders of adrenal gland: Secondary | ICD-10-CM

## 2018-04-05 DIAGNOSIS — M84375D Stress fracture, left foot, subsequent encounter for fracture with routine healing: Secondary | ICD-10-CM | POA: Diagnosis not present

## 2018-04-05 DIAGNOSIS — M810 Age-related osteoporosis without current pathological fracture: Secondary | ICD-10-CM | POA: Diagnosis not present

## 2018-04-05 DIAGNOSIS — E559 Vitamin D deficiency, unspecified: Secondary | ICD-10-CM | POA: Diagnosis not present

## 2018-04-19 ENCOUNTER — Other Ambulatory Visit: Payer: Self-pay | Admitting: Family Medicine

## 2018-04-26 ENCOUNTER — Telehealth: Payer: Self-pay | Admitting: *Deleted

## 2018-04-26 NOTE — Telephone Encounter (Signed)
Fax refill request received from [facility] for Evista tablets. You have not prescribed this for patient before but it is on her med list as historical med. Please advise.

## 2018-04-26 NOTE — Telephone Encounter (Signed)
.   Sees Dr. Cyd Silence now for bone health. Used to see gyn for this. I do not rx

## 2018-04-27 ENCOUNTER — Encounter: Payer: Self-pay | Admitting: *Deleted

## 2018-04-27 NOTE — Telephone Encounter (Signed)
Message sent to patient

## 2018-05-01 DIAGNOSIS — M84375D Stress fracture, left foot, subsequent encounter for fracture with routine healing: Secondary | ICD-10-CM | POA: Diagnosis not present

## 2018-05-01 DIAGNOSIS — E559 Vitamin D deficiency, unspecified: Secondary | ICD-10-CM | POA: Diagnosis not present

## 2018-05-01 DIAGNOSIS — M810 Age-related osteoporosis without current pathological fracture: Secondary | ICD-10-CM | POA: Diagnosis not present

## 2018-05-09 ENCOUNTER — Ambulatory Visit (INDEPENDENT_AMBULATORY_CARE_PROVIDER_SITE_OTHER): Payer: Medicare HMO | Admitting: Endocrinology

## 2018-05-09 ENCOUNTER — Encounter: Payer: Self-pay | Admitting: Endocrinology

## 2018-05-09 DIAGNOSIS — E279 Disorder of adrenal gland, unspecified: Secondary | ICD-10-CM | POA: Diagnosis not present

## 2018-05-09 DIAGNOSIS — E278 Other specified disorders of adrenal gland: Secondary | ICD-10-CM

## 2018-05-09 NOTE — Patient Instructions (Addendum)
No further testing for the adrenal nodule is needed.  I hope you continue to feel well.   I would be happy to see you back here as needed

## 2018-05-09 NOTE — Progress Notes (Signed)
Subjective:    Patient ID: Kelly Petersen, female    DOB: 08-21-1946, 72 y.o.   MRN: [id number]  HPI Pt is referred by Dr Maudie Mercury, for adrenal mass.  This was noted in 2002, incidentally on MRI of the liver.  It has been followed with serial labs and imaging, since then.  she reports of moderate palpitations in the chest, and assoc headache.  she has h/o HTN.  she has no h/o alcohol withdrawal, neurofibromas, hemangiomas, brain aneurysm, cocaine use, menopause, or thyroid disease, or kidney problems.   Past Medical History:  Diagnosis Date  . Adrenal mass ([place]) 01/02/2017   -monitored by endocrinologist in the past -reports told did not need further imaging  . Asthma   . Chicken pox   . Depression   . Eczema   . Endometrial cancer ([place])   . Environmental allergies   . Glaucoma   . Hyperglycemia   . Hyperlipidemia   . Insomnia   . Osteopenia     Past Surgical History:  Procedure Laterality Date  . 2004    . ABDOMINAL HYSTERECTOMY    . BREAST SURGERY      Social History   Socioeconomic History  . Marital status: Single    Spouse name: Not on file  . Number of children: Not on file  . Years of education: Not on file  . Highest education level: Not on file  Occupational History  . Not on file  Social Needs  . Financial resource strain: Not on file  . Food insecurity:    Worry: Not on file    Inability: Not on file  . Transportation needs:    Medical: Not on file    Non-medical: Not on file  Tobacco Use  . Smoking status: Never Smoker  . Smokeless tobacco: Never Used  Substance and Sexual Activity  . Alcohol use: Yes    Comment: 2 drink rarely  . Drug use: No  . Sexual activity: Not on file  Lifestyle  . Physical activity:    Days per week: Not on file    Minutes per session: Not on file  . Stress: Not on file  Relationships  . Social connections:    Talks on phone: Not on file    Gets together: Not on file    Attends religious service: Not on file    Active  member of club or organization: Not on file    Attends meetings of clubs or organizations: Not on file    Relationship status: Not on file  . Intimate partner violence:    Fear of current or ex partner: Not on file    Emotionally abused: Not on file    Physically abused: Not on file    Forced sexual activity: Not on file  Other Topics Concern  . Not on file  Social History Narrative   Work or School: cares for grandchild      Home Situation: lives alone      Spiritual Beliefs: Christian, no church currently      Lifestyle: no regular exercise; diet is poor       Current Outpatient Medications on File Prior to Visit  Medication Sig Dispense Refill  . ASMANEX 120 METERED DOSES 220 MCG/INH inhaler USE 2 INHALATIONS TWICE A  DAY 3 Inhaler 0  . Brimonidine Tartrate-Timolol (COMBIGAN OP) Apply 1 drop to eye 2 (two) times daily.    Marland Kitchen CALCIUM PO Take 600 mg by mouth 2 (two) times daily.    Marland Kitchen  cholecalciferol (VITAMIN D) 1000 units tablet Take 1,000 Units by mouth daily.    . clobetasol (TEMOVATE) 0.05 % external solution Apply 1 application topically as needed.  0  . montelukast (SINGULAIR) 10 MG tablet TAKE 1 TABLET BY MOUTH  EVERY NIGHT AT BEDTIME 90 tablet 1  . Pseudoephedrine-Ibuprofen (ADVIL COLD/SINUS PO) Take by mouth as needed.    . raloxifene (EVISTA) 60 MG tablet Take 60 mg by mouth daily.    . rosuvastatin (CRESTOR) 5 MG tablet Take 1 tablet (5 mg total) by mouth daily. 90 tablet 1  . sertraline (ZOLOFT) 25 MG tablet Take 1 tablet (25 mg total) by mouth daily. 90 tablet 1  . traZODone (DESYREL) 50 MG tablet TAKE 1 TABLET BY MOUTH AT  BEDTIME 90 tablet 3  . VITAMIN K PO Take by mouth.     No current facility-administered medications on file prior to visit.     Allergies  Allergen Reactions  . Other Itching    Azogantrazine - Sulfa drug for UTI    Family History  Problem Relation Age of Onset  . Lung cancer Unknown   . Hyperlipidemia Unknown     BP 102/76 (BP  Location: Left Arm, Patient Position: Sitting, Cuff Size: Normal)   Pulse (!) 103   Wt 124 lb 12.8 oz (56.6 kg)   SpO2 96%   BMI 23.58 kg/m    Review of Systems denies headache, flushing, pallor, n/v, syncope, diarrhea, weight loss, chest pain, anxiety, visual loss, palpitations, fever, arthralgias, rhinorrhea, easy bruising, and excessive diaphoresis.  She has chronic sob.      Objective:   Physical Exam VS: see vs page GEN: no distress HEAD: head: no deformity eyes: no periorbital swelling, no proptosis external nose and ears are normal mouth: no lesion seen.  No neurofibromata.  NECK: supple, thyroid is not enlarged CHEST WALL: no deformity LUNGS: clear to auscultation CV: reg rate and rhythm, no murmur ABD: abdomen is soft, nontender.  no hepatosplenomegaly.  not distended.  no hernia MUSCULOSKELETAL: muscle bulk and strength are grossly normal.  no obvious joint swelling.  gait is normal and steady EXTEMITIES: no deformity.  no ulcer on the feet.  feet are of normal color and temp.  no edema PULSES: dorsalis pedis intact bilat.  no carotid bruit NEURO:  cn 2-12 grossly intact.   readily moves all 4's.  sensation is intact to touch on the feet SKIN:  Normal texture and temperature.  No rash or suspicious lesion is visible.  No striae.  NODES:  None palpable at the neck PSYCH: alert, well-oriented.  Does not appear anxious nor depressed.   outside test results are reviewed: CT (2009): 1.7 cm left adrenal adenona (same as 2002)  Lab Results  Component Value Date   CREATININE 0.96 12/14/2015   BUN 15 12/14/2015   NA 139 12/14/2015   K 4.3 12/14/2015   CL 102 12/14/2015   CO2 28 12/14/2015   I have reviewed outside records, and summarized: Pt was noted to have adrenal nodule, and referred here.  Other med probs addressed were wellness, depression, dyslipidemia, and hyperglycemia.      Assessment & Plan:  Adrenal mass, new to me. Given new functional w/u and  stability over time, no further w/u is needed.    Patient Instructions  No further testing for the adrenal nodule is needed.  I hope you continue to feel well.   I would be happy to see you back here as needed

## 2018-05-12 DIAGNOSIS — E279 Disorder of adrenal gland, unspecified: Principal | ICD-10-CM

## 2018-05-12 DIAGNOSIS — E278 Other specified disorders of adrenal gland: Secondary | ICD-10-CM | POA: Insufficient documentation

## 2018-06-12 DIAGNOSIS — M84375D Stress fracture, left foot, subsequent encounter for fracture with routine healing: Secondary | ICD-10-CM | POA: Diagnosis not present

## 2018-06-12 DIAGNOSIS — E559 Vitamin D deficiency, unspecified: Secondary | ICD-10-CM | POA: Diagnosis not present

## 2018-06-12 DIAGNOSIS — M810 Age-related osteoporosis without current pathological fracture: Secondary | ICD-10-CM | POA: Diagnosis not present

## 2018-07-02 DIAGNOSIS — J301 Allergic rhinitis due to pollen: Secondary | ICD-10-CM | POA: Diagnosis not present

## 2018-07-02 DIAGNOSIS — J3089 Other allergic rhinitis: Secondary | ICD-10-CM | POA: Diagnosis not present

## 2018-07-02 DIAGNOSIS — J4540 Moderate persistent asthma, uncomplicated: Secondary | ICD-10-CM | POA: Diagnosis not present

## 2018-07-02 DIAGNOSIS — R062 Wheezing: Secondary | ICD-10-CM | POA: Diagnosis not present

## 2018-07-17 DIAGNOSIS — L821 Other seborrheic keratosis: Secondary | ICD-10-CM | POA: Diagnosis not present

## 2018-07-17 DIAGNOSIS — L309 Dermatitis, unspecified: Secondary | ICD-10-CM | POA: Diagnosis not present

## 2018-08-15 DIAGNOSIS — R69 Illness, unspecified: Secondary | ICD-10-CM | POA: Diagnosis not present

## 2018-09-06 ENCOUNTER — Other Ambulatory Visit: Payer: Self-pay | Admitting: Family Medicine

## 2018-09-06 DIAGNOSIS — J3089 Other allergic rhinitis: Secondary | ICD-10-CM | POA: Diagnosis not present

## 2018-09-06 DIAGNOSIS — J4540 Moderate persistent asthma, uncomplicated: Secondary | ICD-10-CM | POA: Diagnosis not present

## 2018-09-06 DIAGNOSIS — H1045 Other chronic allergic conjunctivitis: Secondary | ICD-10-CM | POA: Diagnosis not present

## 2018-09-06 DIAGNOSIS — J301 Allergic rhinitis due to pollen: Secondary | ICD-10-CM | POA: Diagnosis not present

## 2018-09-10 DIAGNOSIS — H401131 Primary open-angle glaucoma, bilateral, mild stage: Secondary | ICD-10-CM | POA: Diagnosis not present

## 2018-09-10 DIAGNOSIS — H2513 Age-related nuclear cataract, bilateral: Secondary | ICD-10-CM | POA: Diagnosis not present

## 2018-09-10 DIAGNOSIS — H52213 Irregular astigmatism, bilateral: Secondary | ICD-10-CM | POA: Diagnosis not present

## 2018-09-10 DIAGNOSIS — H18713 Corneal ectasia, bilateral: Secondary | ICD-10-CM | POA: Diagnosis not present

## 2018-09-20 ENCOUNTER — Ambulatory Visit: Payer: Medicare HMO | Admitting: Family Medicine

## 2018-09-20 ENCOUNTER — Encounter: Payer: Self-pay | Admitting: Family Medicine

## 2018-09-20 VITALS — BP 90/60 | HR 86 | Temp 97.8°F | Ht 61.0 in | Wt 129.6 lb

## 2018-09-20 DIAGNOSIS — F3342 Major depressive disorder, recurrent, in full remission: Secondary | ICD-10-CM | POA: Diagnosis not present

## 2018-09-20 DIAGNOSIS — R739 Hyperglycemia, unspecified: Secondary | ICD-10-CM | POA: Diagnosis not present

## 2018-09-20 DIAGNOSIS — E785 Hyperlipidemia, unspecified: Secondary | ICD-10-CM

## 2018-09-20 DIAGNOSIS — M8580 Other specified disorders of bone density and structure, unspecified site: Secondary | ICD-10-CM

## 2018-09-20 DIAGNOSIS — R69 Illness, unspecified: Secondary | ICD-10-CM | POA: Diagnosis not present

## 2018-09-20 NOTE — Patient Instructions (Addendum)
BEFORE YOU LEAVE: -PHQ9 -follow up: 3-4 months  Follow up with dermatology about the rash.  We recommend the following healthy lifestyle for LIFE: 1) Small portions. But, make sure to get regular (at least 3 per day), healthy meals and small healthy snacks if needed.  2) Eat a healthy clean diet.   TRY TO EAT: -at least 5-7 servings of low sugar, colorful, and nutrient rich vegetables per day (not corn, potatoes or bananas.) -berries are the best choice if you wish to eat fruit (only eat small amounts if trying to reduce weight)  -lean meets (fish, white meat of chicken or [place]) -vegan proteins for some meals - beans or tofu, whole grains, nuts and seeds -Replace bad fats with good fats - good fats include: fish, nuts and seeds, canola oil, olive oil -small amounts of low fat or non fat dairy -small amounts of100 % whole grains - check the lables -drink plenty of water  AVOID: -SUGAR, sweets, anything with added sugar, corn syrup or sweeteners - must read labels as even foods advertised as "healthy" often are loaded with sugar -if you must have a sweetener, small amounts of stevia may be best -sweetened beverages and artificially sweetened beverages -simple starches (rice, bread, potatoes, pasta, chips, etc - small amounts of 100% whole grains are ok) -red meat, pork, butter -fried foods, fast food, processed food, excessive dairy, eggs and coconut.  3)Get at least 150 minutes of sweaty aerobic exercise per week.  4)Reduce stress - consider counseling, meditation and relaxation to balance other aspects of your life.

## 2018-09-20 NOTE — Progress Notes (Signed)
HPI:  Using dictation device. Unfortunately this device frequently misinterprets words/phrases.  Kelly Petersen is a pleasant 72 y.o. here for follow up. Chronic medical problems summarized below were reviewed for changes and stability and were updated as needed below. These issues and their treatment remain stable for the most part.  Admits that her diet has been poor.  She eats quite a bit of fast food. Eats cake for dinner sometimes instead of dinner, no regular exercise. Mood good. Seeing dermatologist for rash on back, itchy, started with prolia she thinks. denies CP, SOB, DOE, treatment intolerance or new symptoms. Phq9, mammo, flu vaccine  Asthma and Allergens: -dx in 1998 -never hospitalized -stable  Hyperlipidemia/Prediabetes: -no regular aerobic exercise; poor diet -on crestor 5mg  for a long time  Hx of Depression/Insomnia: - on zoloft low dose -takes low dose of trazadone prn a few times per month -denies: SI, depression, hx of hospitalization  Eczema: -sees skin surgery center, uses clobetasol sol  Osteopenia: -sees gyn for this, Dr. Alwyn Pea -hx uterine ca -taking Evista since hysterectomy - always rxed by gyn -did dexa with gyn in the past, did DEXA with ortho, Dr. Cyd Silence in 03/2018 - on prolia  Hx Adrenal Mass: -stable on many years of follow up -saw endo 2019 and they advised no further monitoring needed   ROS: See pertinent positives and negatives per HPI.  Past Medical History:  Diagnosis Date  . Adrenal mass ([place]) 01/02/2017   -monitored by endocrinologist in the past -reports told did not need further imaging  . Asthma   . Chicken pox   . Depression   . Eczema   . Endometrial cancer ([place])   . Environmental allergies   . Glaucoma   . Hyperglycemia   . Hyperlipidemia   . Insomnia   . Osteopenia     Past Surgical History:  Procedure Laterality Date  . 2004    . ABDOMINAL HYSTERECTOMY    . BREAST SURGERY      Family History  Problem  Relation Age of Onset  . Lung cancer Unknown   . Hyperlipidemia Unknown     SOCIAL HX: see hpi   Current Outpatient Medications:  .  ASMANEX 120 METERED DOSES 220 MCG/INH inhaler, USE 2 INHALATIONS TWICE A  DAY, Disp: 3 Inhaler, Rfl: 0 .  Brimonidine Tartrate-Timolol (COMBIGAN OP), Apply 1 drop to eye 2 (two) times daily., Disp: , Rfl:  .  CALCIUM PO, Take 600 mg by mouth 2 (two) times daily., Disp: , Rfl:  .  cholecalciferol (VITAMIN D) 1000 units tablet, Take 1,000 Units by mouth daily., Disp: , Rfl:  .  clobetasol (TEMOVATE) 0.05 % external solution, Apply 1 application topically as needed., Disp: , Rfl: 0 .  denosumab (PROLIA) 60 MG/ML SOSY injection, Inject 60 mg into the skin every 6 (six) months., Disp: , Rfl:  .  levocetirizine (XYZAL) 5 MG tablet, , Disp: , Rfl:  .  montelukast (SINGULAIR) 10 MG tablet, TAKE 1 TABLET BY MOUTH  EVERY NIGHT AT BEDTIME, Disp: 90 tablet, Rfl: 1 .  Pseudoephedrine-Ibuprofen (ADVIL COLD/SINUS PO), Take by mouth as needed., Disp: , Rfl:  .  raloxifene (EVISTA) 60 MG tablet, Take 60 mg by mouth daily., Disp: , Rfl:  .  rosuvastatin (CRESTOR) 5 MG tablet, Take 1 tablet (5 mg total) by mouth daily., Disp: 90 tablet, Rfl: 1 .  rosuvastatin (CRESTOR) 5 MG tablet, TAKE 1 TABLET BY MOUTH  DAILY, Disp: 90 tablet, Rfl: 1 .  sertraline (ZOLOFT) 25  MG tablet, TAKE 1 TABLET BY MOUTH  DAILY, Disp: 90 tablet, Rfl: 1 .  traZODone (DESYREL) 50 MG tablet, TAKE 1 TABLET BY MOUTH AT  BEDTIME, Disp: 90 tablet, Rfl: 3 .  triamcinolone cream (KENALOG) 0.1 %, APP 1 APPLICATION ON THE SKIN BID PRN UTD, Disp: , Rfl: 2 .  VITAMIN K PO, Take by mouth., Disp: , Rfl:   EXAM:  Vitals:   09/20/18 1010  BP: 90/60  Pulse: 86  Temp: 97.8 F (36.6 C)    Body mass index is 24.49 kg/m.  GENERAL: vitals reviewed and listed above, alert, oriented, appears well hydrated and in no acute distress  HEENT: atraumatic, conjunttiva clear, no obvious abnormalities on inspection of  external nose and ears  NECK: no obvious masses on inspection  LUNGS: clear to auscultation bilaterally, no wheezes, rales or rhonchi, good air movement  CV: HRRR, no peripheral edema  MS: moves all extremities without noticeable abnormality  SKIN: dry skin on back, patch of erythematous excoriated papules bilat lower back  PSYCH: pleasant and cooperative, no obvious depression or anxiety  ASSESSMENT AND PLAN:  Discussed the following assessment and plan:  Hyperlipidemia, unspecified hyperlipidemia type  Osteopenia, unspecified location  Recurrent major depressive disorder, in full remission ([place])  Hyperglycemia  -lifestyle recs discussed at length - advised healthy low sugar meals and regular aerobic exercise -mood stable, cont current meds -follow up derm for rash -seeing specialist for osteoporosis -follow up 3-4 months with recheck fasting labs then -Patient advised to return or notify a doctor immediately if symptoms worsen or persist or new concerns arise.  Patient Instructions  BEFORE YOU LEAVE: -PHQ9 -follow up: 3-4 months  Follow up with dermatology about the rash.  We recommend the following healthy lifestyle for LIFE: 1) Small portions. But, make sure to get regular (at least 3 per day), healthy meals and small healthy snacks if needed.  2) Eat a healthy clean diet.   TRY TO EAT: -at least 5-7 servings of low sugar, colorful, and nutrient rich vegetables per day (not corn, potatoes or bananas.) -berries are the best choice if you wish to eat fruit (only eat small amounts if trying to reduce weight)  -lean meets (fish, white meat of chicken or [place]) -vegan proteins for some meals - beans or tofu, whole grains, nuts and seeds -Replace bad fats with good fats - good fats include: fish, nuts and seeds, canola oil, olive oil -small amounts of low fat or non fat dairy -small amounts of100 % whole grains - check the lables -drink plenty of  water  AVOID: -SUGAR, sweets, anything with added sugar, corn syrup or sweeteners - must read labels as even foods advertised as "healthy" often are loaded with sugar -if you must have a sweetener, small amounts of stevia may be best -sweetened beverages and artificially sweetened beverages -simple starches (rice, bread, potatoes, pasta, chips, etc - small amounts of 100% whole grains are ok) -red meat, pork, butter -fried foods, fast food, processed food, excessive dairy, eggs and coconut.  3)Get at least 150 minutes of sweaty aerobic exercise per week.  4)Reduce stress - consider counseling, meditation and relaxation to balance other aspects of your life.     Lucretia Kern, DO

## 2018-12-10 DIAGNOSIS — R5383 Other fatigue: Secondary | ICD-10-CM | POA: Diagnosis not present

## 2018-12-10 DIAGNOSIS — M810 Age-related osteoporosis without current pathological fracture: Secondary | ICD-10-CM | POA: Diagnosis not present

## 2018-12-10 DIAGNOSIS — E559 Vitamin D deficiency, unspecified: Secondary | ICD-10-CM | POA: Diagnosis not present

## 2018-12-18 DIAGNOSIS — M810 Age-related osteoporosis without current pathological fracture: Secondary | ICD-10-CM | POA: Diagnosis not present

## 2018-12-18 DIAGNOSIS — E559 Vitamin D deficiency, unspecified: Secondary | ICD-10-CM | POA: Diagnosis not present

## 2019-01-16 NOTE — Progress Notes (Deleted)
HPI:  Using dictation device. Unfortunately this device frequently misinterprets words/phrases.  Kelly Petersen is a pleasant 73 y.o. here for follow up. Chronic medical problems summarized below were reviewed for changes and stability and were updated as needed below. These issues and their treatment remain stable for the most part. ***. Denies CP, SOB, DOE, treatment intolerance or new symptoms.  Asthma and Allergens: -dx in 1998 -never hospitalized -stable  Hyperlipidemia/Prediabetes: -no regular aerobic exercise; poor diet -on crestor 5mg  for a long time  Hx ofDepression/Insomnia: - on zoloftlow dose -takes low dose of trazadoneprn a few times per month -denies: SI, depression, hx of hospitalization  Eczema: -sees skin surgery center, uses clobetasol sol  Osteopenia: -sees gyn for this, Dr. Alwyn Pea -hx uterine ca -taking Evista since hysterectomy - always rxed by gyn -did dexa with gynin the past, did DEXA with ortho, Dr. Cyd Silence in 03/2018 - on prolia  Hx Adrenal Mass: -stable on many years of follow up -saw endo 2019 and they advised no further monitoring needed   ROS: See pertinent positives and negatives per HPI.  Past Medical History:  Diagnosis Date  . Adrenal mass ([place]) 01/02/2017   -monitored by endocrinologist in the past -reports told did not need further imaging  . Asthma   . Chicken pox   . Depression   . Eczema   . Endometrial cancer ([place])   . Environmental allergies   . Glaucoma   . Hyperglycemia   . Hyperlipidemia   . Insomnia   . Osteopenia     Past Surgical History:  Procedure Laterality Date  . 2004    . ABDOMINAL HYSTERECTOMY    . BREAST SURGERY      Family History  Problem Relation Age of Onset  . Lung cancer Unknown   . Hyperlipidemia Unknown     SOCIAL HX: ***   Current Outpatient Medications:  .  ASMANEX 120 METERED DOSES 220 MCG/INH inhaler, USE 2 INHALATIONS TWICE A  DAY, Disp: 3 Inhaler, Rfl: 0 .  Brimonidine  Tartrate-Timolol (COMBIGAN OP), Apply 1 drop to eye 2 (two) times daily., Disp: , Rfl:  .  CALCIUM PO, Take 600 mg by mouth 2 (two) times daily., Disp: , Rfl:  .  cholecalciferol (VITAMIN D) 1000 units tablet, Take 1,000 Units by mouth daily., Disp: , Rfl:  .  clobetasol (TEMOVATE) 0.05 % external solution, Apply 1 application topically as needed., Disp: , Rfl: 0 .  denosumab (PROLIA) 60 MG/ML SOSY injection, Inject 60 mg into the skin every 6 (six) months., Disp: , Rfl:  .  levocetirizine (XYZAL) 5 MG tablet, , Disp: , Rfl:  .  montelukast (SINGULAIR) 10 MG tablet, TAKE 1 TABLET BY MOUTH  EVERY NIGHT AT BEDTIME, Disp: 90 tablet, Rfl: 1 .  Pseudoephedrine-Ibuprofen (ADVIL COLD/SINUS PO), Take by mouth as needed., Disp: , Rfl:  .  raloxifene (EVISTA) 60 MG tablet, Take 60 mg by mouth daily., Disp: , Rfl:  .  rosuvastatin (CRESTOR) 5 MG tablet, Take 1 tablet (5 mg total) by mouth daily., Disp: 90 tablet, Rfl: 1 .  rosuvastatin (CRESTOR) 5 MG tablet, TAKE 1 TABLET BY MOUTH  DAILY, Disp: 90 tablet, Rfl: 1 .  sertraline (ZOLOFT) 25 MG tablet, TAKE 1 TABLET BY MOUTH  DAILY, Disp: 90 tablet, Rfl: 1 .  traZODone (DESYREL) 50 MG tablet, TAKE 1 TABLET BY MOUTH AT  BEDTIME, Disp: 90 tablet, Rfl: 3 .  triamcinolone cream (KENALOG) 0.1 %, APP 1 APPLICATION ON THE SKIN BID PRN UTD,  Disp: , Rfl: 2 .  VITAMIN K PO, Take by mouth., Disp: , Rfl:   EXAM:  There were no vitals filed for this visit.  There is no height or weight on file to calculate BMI.  GENERAL: vitals reviewed and listed above, alert, oriented, appears well hydrated and in no acute distress  HEENT: atraumatic, conjunttiva clear, no obvious abnormalities on inspection of external nose and ears  NECK: no obvious masses on inspection  LUNGS: clear to auscultation bilaterally, no wheezes, rales or rhonchi, good air movement  CV: HRRR, no peripheral edema  MS: moves all extremities without noticeable abnormality *** PSYCH: pleasant and  cooperative, no obvious depression or anxiety  ASSESSMENT AND PLAN:  Discussed the following assessment and plan:  No diagnosis found.  *** -Patient advised to return or notify a doctor immediately if symptoms worsen or persist or new concerns arise.  There are no Patient Instructions on file for this visit.  Lucretia Kern, DO

## 2019-01-17 ENCOUNTER — Ambulatory Visit: Payer: Medicare HMO | Admitting: Family Medicine

## 2019-02-23 ENCOUNTER — Other Ambulatory Visit: Payer: Self-pay | Admitting: Family Medicine

## 2019-03-26 ENCOUNTER — Ambulatory Visit: Payer: Medicare HMO

## 2019-05-13 DIAGNOSIS — H1045 Other chronic allergic conjunctivitis: Secondary | ICD-10-CM | POA: Diagnosis not present

## 2019-05-13 DIAGNOSIS — J4540 Moderate persistent asthma, uncomplicated: Secondary | ICD-10-CM | POA: Diagnosis not present

## 2019-05-13 DIAGNOSIS — J301 Allergic rhinitis due to pollen: Secondary | ICD-10-CM | POA: Diagnosis not present

## 2019-05-13 DIAGNOSIS — J3089 Other allergic rhinitis: Secondary | ICD-10-CM | POA: Diagnosis not present

## 2019-06-13 DIAGNOSIS — M810 Age-related osteoporosis without current pathological fracture: Secondary | ICD-10-CM | POA: Diagnosis not present

## 2019-06-13 DIAGNOSIS — R5383 Other fatigue: Secondary | ICD-10-CM | POA: Diagnosis not present

## 2019-06-13 DIAGNOSIS — E559 Vitamin D deficiency, unspecified: Secondary | ICD-10-CM | POA: Diagnosis not present

## 2019-06-20 DIAGNOSIS — H52203 Unspecified astigmatism, bilateral: Secondary | ICD-10-CM | POA: Diagnosis not present

## 2019-06-20 DIAGNOSIS — H401131 Primary open-angle glaucoma, bilateral, mild stage: Secondary | ICD-10-CM | POA: Diagnosis not present

## 2019-06-20 DIAGNOSIS — H26493 Other secondary cataract, bilateral: Secondary | ICD-10-CM | POA: Diagnosis not present

## 2019-06-20 DIAGNOSIS — H2513 Age-related nuclear cataract, bilateral: Secondary | ICD-10-CM | POA: Diagnosis not present

## 2019-06-25 DIAGNOSIS — M810 Age-related osteoporosis without current pathological fracture: Secondary | ICD-10-CM | POA: Diagnosis not present

## 2019-06-25 DIAGNOSIS — E559 Vitamin D deficiency, unspecified: Secondary | ICD-10-CM | POA: Diagnosis not present

## 2019-07-05 DIAGNOSIS — R69 Illness, unspecified: Secondary | ICD-10-CM | POA: Diagnosis not present

## 2019-07-22 ENCOUNTER — Other Ambulatory Visit: Payer: Self-pay | Admitting: Family Medicine

## 2019-07-22 ENCOUNTER — Telehealth: Payer: Self-pay | Admitting: *Deleted

## 2019-07-22 DIAGNOSIS — D721 Eosinophilia, unspecified: Secondary | ICD-10-CM

## 2019-07-22 DIAGNOSIS — R739 Hyperglycemia, unspecified: Secondary | ICD-10-CM

## 2019-07-22 DIAGNOSIS — L821 Other seborrheic keratosis: Secondary | ICD-10-CM | POA: Diagnosis not present

## 2019-07-22 DIAGNOSIS — M8580 Other specified disorders of bone density and structure, unspecified site: Secondary | ICD-10-CM

## 2019-07-22 DIAGNOSIS — D225 Melanocytic nevi of trunk: Secondary | ICD-10-CM | POA: Diagnosis not present

## 2019-07-22 DIAGNOSIS — E559 Vitamin D deficiency, unspecified: Secondary | ICD-10-CM

## 2019-07-22 DIAGNOSIS — L814 Other melanin hyperpigmentation: Secondary | ICD-10-CM | POA: Diagnosis not present

## 2019-07-22 DIAGNOSIS — L308 Other specified dermatitis: Secondary | ICD-10-CM | POA: Diagnosis not present

## 2019-07-22 DIAGNOSIS — E785 Hyperlipidemia, unspecified: Secondary | ICD-10-CM

## 2019-07-22 DIAGNOSIS — L820 Inflamed seborrheic keratosis: Secondary | ICD-10-CM | POA: Diagnosis not present

## 2019-07-22 NOTE — Telephone Encounter (Signed)
I ordered bloodwork for her if you want to get her set up for lab visit. I did the basics/same as Dr. Maudie Mercury as well as a vitamin D (due to osteopenia) and a blood count since that hadn't been done in a few years.

## 2019-07-22 NOTE — Telephone Encounter (Signed)
Copied from [place] [id number]. Topic: Appointment Scheduling - Scheduling Inquiry for Clinic >> Jul 22, 2019  2:37 PM Salvatore Marvel wrote: Reason for CRM: pt would like call about getting labs set up before visit on 9/23 with Dr. Ethlyn Gallery. Please advise

## 2019-07-23 NOTE — Telephone Encounter (Signed)
Called the pt and informed her of the message below.  Lab appt scheduled for 9/16.

## 2019-07-24 ENCOUNTER — Other Ambulatory Visit (INDEPENDENT_AMBULATORY_CARE_PROVIDER_SITE_OTHER): Payer: Medicare HMO

## 2019-07-24 ENCOUNTER — Other Ambulatory Visit: Payer: Self-pay

## 2019-07-24 DIAGNOSIS — D721 Eosinophilia, unspecified: Secondary | ICD-10-CM

## 2019-07-24 DIAGNOSIS — E785 Hyperlipidemia, unspecified: Secondary | ICD-10-CM | POA: Diagnosis not present

## 2019-07-24 DIAGNOSIS — E559 Vitamin D deficiency, unspecified: Secondary | ICD-10-CM | POA: Diagnosis not present

## 2019-07-24 DIAGNOSIS — R739 Hyperglycemia, unspecified: Secondary | ICD-10-CM

## 2019-07-24 LAB — COMPREHENSIVE METABOLIC PANEL
ALT: 20 U/L (ref 0–35)
AST: 21 U/L (ref 0–37)
Albumin: 4.2 g/dL (ref 3.5–5.2)
Alkaline Phosphatase: 67 U/L (ref 39–117)
BUN: 21 mg/dL (ref 6–23)
CO2: 30 mEq/L (ref 19–32)
Calcium: 10.2 mg/dL (ref 8.4–10.5)
Chloride: 104 mEq/L (ref 96–112)
Creatinine, Ser: 1.04 mg/dL (ref 0.40–1.20)
GFR: 51.63 mL/min — ABNORMAL LOW (ref >60.00)
Glucose, Bld: 91 mg/dL (ref 70–99)
Potassium: 5.2 mEq/L — ABNORMAL HIGH (ref 3.5–5.1)
Sodium: 141 mEq/L (ref 135–145)
Total Bilirubin: 0.4 mg/dL (ref 0.2–1.2)
Total Protein: 7 g/dL (ref 6.0–8.3)

## 2019-07-24 LAB — CBC WITH DIFFERENTIAL/PLATELET
Basophils Absolute: 0.1 10*3/uL (ref 0.0–0.1)
Basophils Relative: 1.2 % (ref 0.0–3.0)
Eosinophils Absolute: 0.6 10*3/uL (ref 0.0–0.7)
Eosinophils Relative: 8.6 % — ABNORMAL HIGH (ref 0.0–5.0)
HCT: 40.6 % (ref 36.0–46.0)
Hemoglobin: 13.3 g/dL (ref 12.0–15.0)
Lymphocytes Relative: 33 % (ref 12.0–46.0)
Lymphs Abs: 2.4 10*3/uL (ref 0.7–4.0)
MCHC: 32.6 g/dL (ref 30.0–36.0)
MCV: 97.3 fl (ref 78.0–100.0)
Monocytes Absolute: 0.6 10*3/uL (ref 0.1–1.0)
Monocytes Relative: 8.3 % (ref 3.0–12.0)
Neutro Abs: 3.6 10*3/uL (ref 1.4–7.7)
Neutrophils Relative %: 48.9 % (ref 43.0–77.0)
Platelets: 258 10*3/uL (ref 150.0–400.0)
RBC: 4.18 Mil/uL (ref 3.87–5.11)
RDW: 13.4 % (ref 11.5–15.5)
WBC: 7.3 10*3/uL (ref 4.0–10.5)

## 2019-07-24 LAB — HEMOGLOBIN A1C: Hgb A1c MFr Bld: 6.3 % (ref 4.6–6.5)

## 2019-07-24 LAB — LIPID PANEL
Cholesterol: 163 mg/dL (ref 0–200)
HDL: 53.2 mg/dL (ref >39.00)
LDL Cholesterol: 77 mg/dL (ref 0–99)
NonHDL: 109.74
Total CHOL/HDL Ratio: 3
Triglycerides: 163 mg/dL — ABNORMAL HIGH (ref 0.0–149.0)
VLDL: 32.6 mg/dL (ref 0.0–40.0)

## 2019-07-24 LAB — TSH: TSH: 4.91 u[IU]/mL — ABNORMAL HIGH (ref 0.35–4.50)

## 2019-07-24 LAB — VITAMIN D 25 HYDROXY (VIT D DEFICIENCY, FRACTURES): VITD: 74.16 ng/mL (ref 30.00–100.00)

## 2019-07-31 ENCOUNTER — Ambulatory Visit (INDEPENDENT_AMBULATORY_CARE_PROVIDER_SITE_OTHER): Payer: Medicare HMO | Admitting: Family Medicine

## 2019-07-31 ENCOUNTER — Other Ambulatory Visit: Payer: Self-pay

## 2019-07-31 ENCOUNTER — Ambulatory Visit: Payer: Medicare HMO

## 2019-07-31 ENCOUNTER — Encounter: Payer: Self-pay | Admitting: Family Medicine

## 2019-07-31 DIAGNOSIS — R739 Hyperglycemia, unspecified: Secondary | ICD-10-CM | POA: Diagnosis not present

## 2019-07-31 DIAGNOSIS — J4541 Moderate persistent asthma with (acute) exacerbation: Secondary | ICD-10-CM

## 2019-07-31 DIAGNOSIS — L309 Dermatitis, unspecified: Secondary | ICD-10-CM | POA: Insufficient documentation

## 2019-07-31 DIAGNOSIS — E785 Hyperlipidemia, unspecified: Secondary | ICD-10-CM | POA: Diagnosis not present

## 2019-07-31 DIAGNOSIS — E875 Hyperkalemia: Secondary | ICD-10-CM | POA: Diagnosis not present

## 2019-07-31 DIAGNOSIS — M8580 Other specified disorders of bone density and structure, unspecified site: Secondary | ICD-10-CM | POA: Diagnosis not present

## 2019-07-31 DIAGNOSIS — G4700 Insomnia, unspecified: Secondary | ICD-10-CM | POA: Diagnosis not present

## 2019-07-31 DIAGNOSIS — E039 Hypothyroidism, unspecified: Secondary | ICD-10-CM | POA: Diagnosis not present

## 2019-07-31 MED ORDER — TRAZODONE HCL 50 MG PO TABS: 50.0000 mg | ORAL_TABLET | Freq: Every day | ORAL | 1 refills | Status: DC

## 2019-07-31 NOTE — Progress Notes (Signed)
Virtual Visit via Video Note  I connected with Kelly Petersen  on 08/01/19 at 10:00 AM EDT by a video enabled telemedicine application and verified that I am speaking with the correct person using two identifiers.  Location patient: home Location provider:work office Persons participating in the virtual visit: patient, provider  I discussed the limitations of evaluation and management by telemedicine and the availability of in person appointments. The patient expressed understanding and agreed to proceed.   Kelly Petersen DOB: 1946/01/11 Encounter date: 07/31/2019  This is a 73 y.o. female who presents to establish care. Chief Complaint  Patient presents with  . Establish Care    History of present illness:  Asthma/allergies: has been stable. Asmanex,xyzal,singulair. Allergies have been bad. Bad enough in last year she couldn't wear contact lenses. Eyes stinging, watering.   Hyperglycemia: A1C 6.3 on 07/24/19.   HL: on crestor 5mg . Has cut back on sweets and soda.   Hx of depression: on zoloft 25mg   Insomnia: trazodone prn  Eczema: sees skin surgery center. Working on treatment for dry skin.   Osteopenia/hx of uterine CA: follows with gyn Dr. Alwyn Pea; on evista since hysterectomy. Last DEXA was with ortho Dr.  Cyd Silence 03/2018. On prolia, calcium, vitamin D. Previous bloodwork with Dr. Cyd Silence thyroid was slightly high. She was wondering about repeat. (states that previous TSH was 04/05/18: 5.508).   Thyroid: while discussing lab results and thyroid results, she does state that she has dry skin. Does have some sluggish-ness. Finds energy when she wants to do something. Weight gain is issue. Has gained 5lbs in last year.   Colonoscopy due 12/2023. Has had some food sticking; but doesn't happen often. Sometimes goes away. Not sure last endoscopy (but likely before 2009 since we do not have record). No difficulty with liquids. If she gets it then will last for a couple of days. Sometimes  triggered by pills (like calcium tab).   Past Medical History:  Diagnosis Date  . Adrenal mass ([place]) 01/02/2017   -monitored by endocrinologist in the past -reports told did not need further imaging  . Asthma   . Chicken pox   . Depression   . Eczema   . Endometrial cancer ([place])   . Environmental allergies   . Glaucoma   . Hyperglycemia   . Hyperlipidemia   . Insomnia   . Osteopenia    Past Surgical History:  Procedure Laterality Date  . ABDOMINAL HYSTERECTOMY  1999   endometrial cancer  . BREAST SURGERY  2003   lumpectomy; benign  . ECTOPIC PREGNANCY SURGERY     Allergies  Allergen Reactions  . Other Itching    Azogantrazine - Sulfa drug for UTI   Current Meds  Medication Sig  . ASMANEX 120 METERED DOSES 220 MCG/INH inhaler USE 2 INHALATIONS TWICE A  DAY  . Brimonidine Tartrate-Timolol (COMBIGAN OP) Apply 1 drop to eye 2 (two) times daily.  Marland Kitchen CALCIUM PO Take 600 mg by mouth 2 (two) times daily.  . cholecalciferol (VITAMIN D) 1000 units tablet Take 1,000 Units by mouth daily.  . clobetasol (TEMOVATE) 0.05 % external solution Apply 1 application topically as needed.  Marland Kitchen denosumab (PROLIA) 60 MG/ML SOSY injection Inject 60 mg into the skin every 6 (six) months.  . levocetirizine (XYZAL) 5 MG tablet   . montelukast (SINGULAIR) 10 MG tablet TAKE 1 TABLET BY MOUTH  EVERY NIGHT AT BEDTIME  . Pseudoephedrine-Ibuprofen (ADVIL COLD/SINUS PO) Take by mouth as needed.  . raloxifene (EVISTA) 60 MG tablet  Take 60 mg by mouth daily.  . rosuvastatin (CRESTOR) 5 MG tablet Take 1 tablet (5 mg total) by mouth daily.  . rosuvastatin (CRESTOR) 5 MG tablet TAKE 1 TABLET BY MOUTH  DAILY  . sertraline (ZOLOFT) 25 MG tablet TAKE 1 TABLET BY MOUTH  DAILY  . traZODone (DESYREL) 50 MG tablet Take 1 tablet (50 mg total) by mouth at bedtime.  . triamcinolone cream (KENALOG) 0.1 % APP 1 APPLICATION ON THE SKIN BID PRN UTD  . VITAMIN K PO Take by mouth.  . [DISCONTINUED] traZODone (DESYREL) 50 MG  tablet TAKE 1 TABLET BY MOUTH AT  BEDTIME   Social History   Tobacco Use  . Smoking status: Never Smoker  . Smokeless tobacco: Never Used  Substance Use Topics  . Alcohol use: Yes    Comment: 2 drink rarely   Family History  Problem Relation Age of Onset  . Alzheimer's disease Mother 12  . Asthma Mother   . Lung cancer Father 46       worked at [place] [place]  . Cancer Maternal Grandfather 79  . Hyperlipidemia Paternal Grandmother 43  . Lung cancer Paternal Grandfather 61     Review of Systems  Constitutional: Negative for chills, fatigue and fever.  Respiratory: Negative for cough, chest tightness, shortness of breath and wheezing.   Cardiovascular: Negative for chest pain, palpitations and leg swelling.    Objective:  There were no vitals taken for this visit.      BP Readings from Last 3 Encounters:  09/20/18 90/60  05/09/18 102/76  03/20/18 98/70   Wt Readings from Last 3 Encounters:  09/20/18 129 lb 9.6 oz (58.8 kg)  05/09/18 124 lb 12.8 oz (56.6 kg)  03/20/18 126 lb 7 oz (57.4 kg)    EXAM:  GENERAL: alert, oriented, appears well and in no acute distress  HEENT: atraumatic, conjunctiva clear, no obvious abnormalities on inspection of external nose and ears  NECK: normal movements of the head and neck  LUNGS: on inspection no signs of respiratory distress, breathing rate appears normal, no obvious gross SOB, gasping or wheezing  CV: no obvious cyanosis  MS: moves all visible extremities without noticeable abnormality  PSYCH/NEURO: pleasant and cooperative, no obvious depression or anxiety, speech and thought processing grossly intact   Assessment/Plan  1. Hyperlipidemia, unspecified hyperlipidemia type Continue Crestor.  2. Hyperglycemia Blood sugar is been stable.  3. Moderate persistent asthma with acute exacerbation Breathing has been stable.  4. Osteopenia, unspecified location Following with specialist.  On Prolia.  5.  Insomnia, unspecified type Does well with trazodone just if needed.  Does not take this every night. - traZODone (DESYREL) 50 MG tablet; Take 1 tablet (50 mg total) by mouth at bedtime.  Dispense: 90 tablet; Refill: 1  6. Eczema, unspecified type Has been stable.  7. Hyperkalemia Monitor potassium. - Basic metabolic panel; Future  8. Hypothyroidism, unspecified type Discussed thyroid results.  Will recheck TSH along with thyroid hormones in 3 months time. - TSH; Future - T4, free; Future - T3, free; Future   Return in about 3 months (around 10/30/2019) for labs, then in person visit.   I discussed the assessment and treatment plan with the patient. The patient was provided an opportunity to ask questions and all were answered. The patient agreed with the plan and demonstrated an understanding of the instructions.   The patient was advised to call back or seek an in-person evaluation if the symptoms worsen or if  the condition fails to improve as anticipated.  I provided 30 minutes of non-face-to-face time during this encounter.   Micheline Rough, MD

## 2019-08-01 ENCOUNTER — Telehealth: Payer: Self-pay | Admitting: *Deleted

## 2019-08-01 NOTE — Telephone Encounter (Signed)
Called the pt and offered to schedule the appts as below.  Patient stated she has someone coming to her home in 5 minutes and I advised she return a call to the office to schedule appts.

## 2019-08-01 NOTE — Telephone Encounter (Signed)
-----   Message from Caren Macadam, MD sent at 07/31/2019 10:53 AM EDT ----- Please schedule bloodwork for 3 months time followed by [facility]

## 2019-08-12 ENCOUNTER — Other Ambulatory Visit: Payer: Self-pay | Admitting: Family Medicine

## 2019-10-15 ENCOUNTER — Other Ambulatory Visit (INDEPENDENT_AMBULATORY_CARE_PROVIDER_SITE_OTHER): Payer: Medicare HMO

## 2019-10-15 ENCOUNTER — Other Ambulatory Visit: Payer: Self-pay

## 2019-10-15 DIAGNOSIS — E875 Hyperkalemia: Secondary | ICD-10-CM

## 2019-10-15 DIAGNOSIS — E039 Hypothyroidism, unspecified: Secondary | ICD-10-CM

## 2019-10-15 LAB — T3, FREE: T3, Free: 3.3 pg/mL (ref 2.3–4.2)

## 2019-10-15 LAB — BASIC METABOLIC PANEL
BUN: 19 mg/dL (ref 6–23)
CO2: 29 mEq/L (ref 19–32)
Calcium: 9.7 mg/dL (ref 8.4–10.5)
Chloride: 106 mEq/L (ref 96–112)
Creatinine, Ser: 1.15 mg/dL (ref 0.40–1.20)
GFR: 45.94 mL/min — ABNORMAL LOW (ref >60.00)
Glucose, Bld: 111 mg/dL — ABNORMAL HIGH (ref 70–99)
Potassium: 4.2 mEq/L (ref 3.5–5.1)
Sodium: 141 mEq/L (ref 135–145)

## 2019-10-15 LAB — TSH: TSH: 10.64 u[IU]/mL — ABNORMAL HIGH (ref 0.35–4.50)

## 2019-10-15 LAB — T4, FREE: Free T4: 0.51 ng/dL — ABNORMAL LOW (ref 0.60–1.60)

## 2019-10-21 ENCOUNTER — Ambulatory Visit: Payer: Medicare HMO | Admitting: Family Medicine

## 2019-10-21 MED ORDER — SYNTHROID 25 MCG PO TABS: 25.0000 ug | ORAL_TABLET | Freq: Every day | ORAL | 0 refills | Status: DC

## 2019-10-22 ENCOUNTER — Telehealth: Payer: Self-pay

## 2019-10-22 ENCOUNTER — Other Ambulatory Visit: Payer: Self-pay

## 2019-10-22 NOTE — Telephone Encounter (Signed)
Copied from [place] [id number]. Topic: General - Inquiry >> Oct 22, 2019  2:13 PM [name] wrote: Reason for CRM: Patient calling back for pre screening . Please advise

## 2019-10-22 NOTE — Telephone Encounter (Signed)
Spoke to pt to screen for Covid. Pt stated that she came into contact with her Brother-in-law on Thanksgiving. According to pt  Brother-in-law tested positive 10/07/2019. Pt was tested 4 days later at [facility] and she received results back today and they were negaative. Pt denies having any of the Covid sx.

## 2019-10-23 ENCOUNTER — Encounter: Payer: Self-pay | Admitting: Family Medicine

## 2019-10-23 ENCOUNTER — Ambulatory Visit: Payer: Medicare HMO | Admitting: Family Medicine

## 2019-10-23 VITALS — BP 100/74 | HR 94 | Temp 97.8°F | Ht 61.0 in | Wt 137.8 lb

## 2019-10-23 DIAGNOSIS — E785 Hyperlipidemia, unspecified: Secondary | ICD-10-CM | POA: Diagnosis not present

## 2019-10-23 DIAGNOSIS — Z8542 Personal history of malignant neoplasm of other parts of uterus: Secondary | ICD-10-CM | POA: Diagnosis not present

## 2019-10-23 DIAGNOSIS — E039 Hypothyroidism, unspecified: Secondary | ICD-10-CM | POA: Diagnosis not present

## 2019-10-23 DIAGNOSIS — J4541 Moderate persistent asthma with (acute) exacerbation: Secondary | ICD-10-CM | POA: Diagnosis not present

## 2019-10-23 DIAGNOSIS — M8580 Other specified disorders of bone density and structure, unspecified site: Secondary | ICD-10-CM | POA: Diagnosis not present

## 2019-10-23 DIAGNOSIS — R739 Hyperglycemia, unspecified: Secondary | ICD-10-CM | POA: Diagnosis not present

## 2019-10-23 DIAGNOSIS — J309 Allergic rhinitis, unspecified: Secondary | ICD-10-CM | POA: Diagnosis not present

## 2019-10-23 MED ORDER — SHINGRIX 50 MCG/0.5ML IM SUSR: 0.5000 mL | Freq: Once | INTRAMUSCULAR | 0 refills | Status: AC

## 2019-10-23 NOTE — Patient Instructions (Addendum)
Fastfoodnutrition.org  Consider app like my fitness pal - for calorie counting. Shoot for around 1500 cal (1800 is to maintain). Daily exercise is important.    Levothyroxine tablets What is this medicine? LEVOTHYROXINE (lee voe thye ROX een) is a thyroid hormone. This medicine can improve symptoms of thyroid deficiency such as slow speech, lack of energy, weight gain, hair loss, dry skin, and feeling cold. It also helps to treat goiter (an enlarged thyroid gland). It is also used to treat some kinds of thyroid cancer along with surgery and other medicines. This medicine may be used for other purposes; ask your health care provider or pharmacist if you have questions. COMMON BRAND NAME(S): Estre, Euthyrox, Levo-T, Levothroid, Levoxyl, Synthroid, Thyro-Tabs, Unithroid What should I tell my health care provider before I take this medicine? They need to know if you have any of these conditions:  Addison's disease or other adrenal gland problem  angina  bone problems  diabetes  dieting or on a weight loss program  fertility problems  heart disease  pituitary gland problem  take medicines that treat or prevent blood clots  an unusual or allergic reaction to levothyroxine, thyroid hormones, other medicines, foods, dyes, or preservatives  pregnant or trying to get pregnant  breast-feeding How should I use this medicine? Take this medicine by mouth with plenty of water. It is best to take on an empty stomach, at least 30 minutes before or 2 hours after food. Follow the directions on the prescription label. Take at the same time each day. Do not take your medicine more often than directed. Contact your pediatrician regarding the use of this medicine in children. While this drug may be prescribed for children and infants as young as a few days of age for selected conditions, precautions do apply. For infants, you may crush the tablet and place in a small amount of (5-10 ml or 1 to 2  teaspoonfuls) of water, breast milk, or non-soy based infant formula. Do not mix with soy-based infant formula. Give as directed. Overdosage: If you think you have taken too much of this medicine contact a poison control center or emergency room at once. NOTE: This medicine is only for you. Do not share this medicine with others. What if I miss a dose? If you miss a dose, take it as soon as you can. If it is almost time for your next dose, take only that dose. Do not take double or extra doses. What may interact with this medicine?  amiodarone  antacids  anti-thyroid medicines  calcium supplements  carbamazepine  certain medicines for depression  certain medicines to treat cancer  cholestyramine  clofibrate  colesevelam  colestipol  digoxin  female hormones, like estrogens or progestins and birth control pills, patches, rings, or injections  iron supplements  kayexylate  ketamine  liquid nutrition products like Ensure  lithium  medicines for colds and breathing difficulties  medicines for diabetes  medicines or dietary supplements for weight loss  methadone  niacin  orlistat  oxandrolone  phenobarbital or other barbiturates  phenytoin  rifampin  sevelamer  simethicone  soy isoflavones  steroid medicines like prednisone or cortisone  sucralfate  testosterone  theophylline  warfarin This list may not describe all possible interactions. Give your health care provider a list of all the medicines, herbs, non-prescription drugs, or dietary supplements you use. Also tell them if you smoke, drink alcohol, or use illegal drugs. Some items may interact with your medicine. What should I watch  for while using this medicine? Be sure to take this medicine with plenty of fluids. Some tablets may cause choking, gagging, or difficulty swallowing from the tablet getting stuck in your throat. Most of these problems disappear if the medicine is taken with  the right amount of water or other fluids. Do not switch brands of this medicine unless your health care professional agrees with the change. Ask questions if you are uncertain. You will need regular exams and occasional blood tests to check the response to treatment. If you are receiving this medicine for an underactive thyroid, it may be several weeks before you notice an improvement. Check with your doctor or health care professional if your symptoms do not improve. It may be necessary for you to take this medicine for the rest of your life. Do not stop using this medicine unless your doctor or health care professional advises you to. This medicine can affect blood sugar levels. If you have diabetes, check your blood sugar as directed. You may lose some of your hair when you first start treatment. With time, this usually corrects itself. If you are going to have surgery, tell your doctor or health care professional that you are taking this medicine. What side effects may I notice from receiving this medicine? Side effects that you should report to your doctor or health care professional as soon as possible:  allergic reactions like skin rash, itching or hives, swelling of the face, lips, or tongue  anxious  breathing problems  changes in menstrual periods  chest pain  diarrhea  excessive sweating or intolerance to heat  fast or irregular heartbeat  leg cramps  nervousness  swelling of ankles, feet, or legs  tremors  trouble sleeping  vomiting Side effects that usually do not require medical attention (report to your doctor or health care professional if they continue or are bothersome):  changes in appetite  headache  irritable  nausea  weight loss This list may not describe all possible side effects. Call your doctor for medical advice about side effects. You may report side effects to FDA at 1-800-FDA-1088. Where should I keep my medicine? Keep out of the reach  of children. Store at room temperature between 15 and 30 degrees C (59 and 86 degrees F). Protect from light and moisture. Keep container tightly closed. Throw away any unused medicine after the expiration date. NOTE: This sheet is a summary. It may not cover all possible information. If you have questions about this medicine, talk to your doctor, pharmacist, or health care provider.  2020 Elsevier/Gold Standard (2016-12-05 15:39:30)

## 2019-10-23 NOTE — Progress Notes (Signed)
Kelly Petersen DOB: 02-Feb-1946 Encounter date: 10/23/2019  This is a 73 y.o. female who presents with Chief Complaint  Patient presents with  . Follow-up    History of present illness:  Asthma/allergies: has been stable. Always a struggle. Asmanex,xyzal,singulair. Allergies have been bad. Bad enough in last year she couldn't wear contact lenses. Eyes stinging, watering.   Hyperglycemia: A1C 6.3 on 07/24/19.   HL: on crestor 5mg . Has cut back on sweets and soda.   Hx of depression: on zoloft 25mg   Insomnia: trazodone prn  Eczema: sees skin surgery center. Working on treatment for dry skin.   Osteopenia/hx of uterine CA: follows with gyn Dr. Alwyn Pea; on evista since hysterectomy. Last DEXA was with ortho Dr. Cyd Silence 03/2018. On prolia, calcium, vitamin D.  Thyroid: hypothyroid 10/15/19 labs. Starting synthroid 54mcg daily tomorrow.  Colonoscopy due 12/2023. Has had some food sticking; but doesn't happen often. Sometimes goes away. Not sure last endoscopy (but likely before 2009 since we do not have record). No difficulty with liquids. If she gets it then will last for a couple of days. Sometimes triggered by pills (like calcium tab).   Frustrated with weight.  Tends to eat fast food on a regular basis since this is easy for her to grab and she lives alone.  She is hoping that starting thyroid medication will help somewhat.  Allergies  Allergen Reactions  . Other Itching    Azogantrazine - Sulfa drug for UTI   Current Meds  Medication Sig  . ASMANEX 120 METERED DOSES 220 MCG/INH inhaler USE 2 INHALATIONS TWICE A  DAY  . Brimonidine Tartrate-Timolol (COMBIGAN OP) Apply 1 drop to eye 2 (two) times daily.  Marland Kitchen CALCIUM PO Take 600 mg by mouth 2 (two) times daily.  . cholecalciferol (VITAMIN D) 1000 units tablet Take 1,000 Units by mouth daily.  . clobetasol (TEMOVATE) 0.05 % external solution Apply 1 application topically as needed.  Marland Kitchen denosumab (PROLIA) 60 MG/ML SOSY injection  Inject 60 mg into the skin every 6 (six) months.  . levocetirizine (XYZAL) 5 MG tablet   . montelukast (SINGULAIR) 10 MG tablet TAKE 1 TABLET BY MOUTH  EVERY NIGHT AT BEDTIME  . Pseudoephedrine-Ibuprofen (ADVIL COLD/SINUS PO) Take by mouth as needed.  . raloxifene (EVISTA) 60 MG tablet Take 60 mg by mouth daily.  . rosuvastatin (CRESTOR) 5 MG tablet Take 1 tablet (5 mg total) by mouth daily.  . rosuvastatin (CRESTOR) 5 MG tablet TAKE 1 TABLET BY MOUTH  DAILY  . sertraline (ZOLOFT) 25 MG tablet TAKE 1 TABLET BY MOUTH  DAILY  . SYNTHROID 25 MCG tablet Take 1 tablet (25 mcg total) by mouth daily before breakfast.  . traZODone (DESYREL) 50 MG tablet Take 1 tablet (50 mg total) by mouth at bedtime.  . triamcinolone cream (KENALOG) 0.1 % APP 1 APPLICATION ON THE SKIN BID PRN UTD  . VITAMIN K PO Take by mouth.    Review of Systems  Constitutional: Negative for chills, fatigue and fever.  Respiratory: Negative for cough, chest tightness, shortness of breath and wheezing.   Cardiovascular: Negative for chest pain, palpitations and leg swelling.    Objective:  BP 100/74 (BP Location: Right Arm, Patient Position: Sitting, Cuff Size: Normal)   Pulse 94   Temp 97.8 F (36.6 C) (Temporal)   Ht 5\' 1"  (1.549 m)   Wt 137 lb 12.8 oz (62.5 kg)   SpO2 96%   BMI 26.04 kg/m   Weight: 137 lb 12.8 oz (62.5 kg)  BP Readings from Last 3 Encounters:  10/23/19 100/74  09/20/18 90/60  05/09/18 102/76   Wt Readings from Last 3 Encounters:  10/23/19 137 lb 12.8 oz (62.5 kg)  09/20/18 129 lb 9.6 oz (58.8 kg)  05/09/18 124 lb 12.8 oz (56.6 kg)    Physical Exam Constitutional:      General: She is not in acute distress.    Appearance: She is well-developed.  Neck:     Thyroid: No thyroid mass or thyroid tenderness.     Comments: ?Mild thyroid enlargement. No nodules or tenderness. Cardiovascular:     Rate and Rhythm: Normal rate and regular rhythm.     Heart sounds: Normal heart sounds. No murmur.  No friction rub.  Pulmonary:     Effort: Pulmonary effort is normal. No respiratory distress.     Breath sounds: Normal breath sounds. No wheezing or rales.  Musculoskeletal:     Right lower leg: No edema.     Left lower leg: No edema.  Neurological:     Mental Status: She is alert and oriented to person, place, and time.  Psychiatric:        Behavior: Behavior normal.     Assessment/Plan  1. Hypothyroidism, unspecified type Has appointment to recheck in Feb.  - TSH; Future  2. Hyperlipidemia, unspecified hyperlipidemia type Not due for recheck at this point; stable last time - Comprehensive metabolic panel; Future  3. Osteopenia, unspecified location Continue with prolia; follows with specialist.   4. Moderate persistent asthma with acute exacerbation Stable.   5. Allergic rhinitis, unspecified seasonality, unspecified trigger stable  6. History of uterine cancer Follows with gynecology regularly.  7. Hyperglycemia We will recheck A1c when we get thyroid studies.  We discussed healthier fast food choices and she was given a pamphlet showing comparison of different options at restaurants.  We discussed portion control.  We discussed importance of daily exercise. - Hemoglobin A1c; Future - Comprehensive metabolic panel; Future   Return for pnding blodwork results in Feb.    Micheline Rough, MD

## 2019-10-24 ENCOUNTER — Telehealth: Payer: Self-pay | Admitting: *Deleted

## 2019-10-24 NOTE — Telephone Encounter (Signed)
-----   Message from Caren Macadam, MD sent at 10/24/2019  2:31 PM EST ----- Patient asked for [place] to send bloodwork fyi to dr. Levada Dy bassett. I thought I could send through system but not seeing her. Could you fax to her? Just fyi.

## 2019-10-24 NOTE — Telephone Encounter (Signed)
Labs from 12/8 faxed to Dr Layne Benton at [phone].

## 2019-11-05 ENCOUNTER — Telehealth: Payer: Self-pay | Admitting: Family Medicine

## 2019-11-05 NOTE — Telephone Encounter (Signed)
Patient called and would like a call back from Dr. Ethlyn Gallery or her CMA about a question she has about eye drops and her thyroid medication. Patient wants to know if she does the eye drops an hour after she takes her thyroid medication in the morning will it affect her.

## 2019-11-05 NOTE — Telephone Encounter (Signed)
She should have no issues with her eye drops and synthroid absorption

## 2019-11-05 NOTE — Telephone Encounter (Signed)
Message Routed to PCP CMA 

## 2019-11-05 NOTE — Telephone Encounter (Signed)
Patient informed of the message below.

## 2019-11-20 ENCOUNTER — Telehealth: Payer: Self-pay | Admitting: *Deleted

## 2019-11-20 NOTE — Telephone Encounter (Signed)
Spoke with the pt and informed her we cannot order lab tests from another office and she could go to [facility], [facility] or another free-standing lab of her choice and she agreed.

## 2019-11-20 NOTE — Telephone Encounter (Signed)
Copied from [place] [id number]. Topic: General - Other >> Nov 20, 2019 10:51 AM Keene Breath wrote: Reason for CRM: Patient would like the nurse to call her regarding her scheduled blood test through [facility].  She also needs a test with [facility].  She wants to know if she could get that blood test as well through [facility].  Please advise and call patient to discuss at   CB# [phone], leave message if no answer leave a message

## 2019-12-10 ENCOUNTER — Other Ambulatory Visit: Payer: Self-pay | Admitting: Family Medicine

## 2019-12-10 DIAGNOSIS — G4700 Insomnia, unspecified: Secondary | ICD-10-CM

## 2019-12-24 ENCOUNTER — Other Ambulatory Visit: Payer: Medicare HMO

## 2019-12-24 ENCOUNTER — Other Ambulatory Visit: Payer: Self-pay

## 2019-12-25 ENCOUNTER — Other Ambulatory Visit (INDEPENDENT_AMBULATORY_CARE_PROVIDER_SITE_OTHER): Payer: Medicare HMO

## 2019-12-25 DIAGNOSIS — E039 Hypothyroidism, unspecified: Secondary | ICD-10-CM | POA: Diagnosis not present

## 2019-12-25 DIAGNOSIS — E785 Hyperlipidemia, unspecified: Secondary | ICD-10-CM | POA: Diagnosis not present

## 2019-12-25 DIAGNOSIS — R739 Hyperglycemia, unspecified: Secondary | ICD-10-CM | POA: Diagnosis not present

## 2019-12-25 LAB — COMPREHENSIVE METABOLIC PANEL
ALT: 22 U/L (ref 0–35)
AST: 23 U/L (ref 0–37)
Albumin: 4.1 g/dL (ref 3.5–5.2)
Alkaline Phosphatase: 62 U/L (ref 39–117)
BUN: 17 mg/dL (ref 6–23)
CO2: 30 mEq/L (ref 19–32)
Calcium: 9.5 mg/dL (ref 8.4–10.5)
Chloride: 101 mEq/L (ref 96–112)
Creatinine, Ser: 1.02 mg/dL (ref 0.40–1.20)
GFR: 52.74 mL/min — ABNORMAL LOW (ref >60.00)
Glucose, Bld: 107 mg/dL — ABNORMAL HIGH (ref 70–99)
Potassium: 4.4 mEq/L (ref 3.5–5.1)
Sodium: 136 mEq/L (ref 135–145)
Total Bilirubin: 0.4 mg/dL (ref 0.2–1.2)
Total Protein: 6.7 g/dL (ref 6.0–8.3)

## 2019-12-25 LAB — HEMOGLOBIN A1C: Hgb A1c MFr Bld: 6 % (ref 4.6–6.5)

## 2019-12-25 LAB — TSH: TSH: 3.99 u[IU]/mL (ref 0.35–4.50)

## 2020-01-17 ENCOUNTER — Telehealth: Payer: Self-pay | Admitting: Family Medicine

## 2020-01-17 MED ORDER — SYNTHROID 25 MCG PO TABS: 25.0000 ug | ORAL_TABLET | Freq: Every day | ORAL | 0 refills | Status: DC

## 2020-01-17 NOTE — Telephone Encounter (Signed)
medication Refill: Synthroid 25 MCG  Pharmacy: [facility]: [phone]   Pt says she only has 3 days left

## 2020-01-17 NOTE — Telephone Encounter (Signed)
Rx done. 

## 2020-02-19 ENCOUNTER — Telehealth: Payer: Self-pay | Admitting: Family Medicine

## 2020-02-19 NOTE — Telephone Encounter (Signed)
She can just skip taking it for the day of surgery.

## 2020-02-19 NOTE — Telephone Encounter (Signed)
Pt is having eye surgery on 04/20. Pt was told not to take Synthroid 25 mg or have any water before the surgery. Pt does not know if she needs to take the medication or if she needs to take it after her surgery? Pt was told to call her primary physician.  Per pt, it's okay to leave a detail message.

## 2020-02-19 NOTE — Telephone Encounter (Signed)
Spoke with the pt and informed her of the message below.   

## 2020-03-20 LAB — HM DEXA SCAN

## 2020-04-02 ENCOUNTER — Encounter: Payer: Self-pay | Admitting: Family Medicine

## 2020-04-14 ENCOUNTER — Telehealth: Payer: Self-pay | Admitting: Family Medicine

## 2020-04-14 MED ORDER — SYNTHROID 25 MCG PO TABS: 25.0000 ug | ORAL_TABLET | Freq: Every day | ORAL | 0 refills | Status: DC

## 2020-04-14 NOTE — Telephone Encounter (Signed)
Rx done. 

## 2020-04-14 NOTE — Telephone Encounter (Signed)
Pt is scheduled for a follow up on June 25th but she only has 6 pills left of the Synthroid and wonders if PCP can send in enough until she is able to be seen.   Medication Refill: Synthroid Pharmacy: [facility]: [phone]   Pt would like a call to know if this is filled [phone]

## 2020-04-30 ENCOUNTER — Other Ambulatory Visit: Payer: Self-pay

## 2020-05-01 ENCOUNTER — Ambulatory Visit: Payer: Medicare HMO | Admitting: Family Medicine

## 2020-05-01 ENCOUNTER — Encounter: Payer: Self-pay | Admitting: Family Medicine

## 2020-05-01 VITALS — BP 108/60 | HR 91 | Temp 98.0°F | Ht 61.0 in | Wt 132.4 lb

## 2020-05-01 DIAGNOSIS — F3342 Major depressive disorder, recurrent, in full remission: Secondary | ICD-10-CM

## 2020-05-01 DIAGNOSIS — J309 Allergic rhinitis, unspecified: Secondary | ICD-10-CM

## 2020-05-01 DIAGNOSIS — M8580 Other specified disorders of bone density and structure, unspecified site: Secondary | ICD-10-CM | POA: Diagnosis not present

## 2020-05-01 DIAGNOSIS — E039 Hypothyroidism, unspecified: Secondary | ICD-10-CM | POA: Diagnosis not present

## 2020-05-01 DIAGNOSIS — Z23 Encounter for immunization: Secondary | ICD-10-CM

## 2020-05-01 DIAGNOSIS — E785 Hyperlipidemia, unspecified: Secondary | ICD-10-CM | POA: Diagnosis not present

## 2020-05-01 DIAGNOSIS — R739 Hyperglycemia, unspecified: Secondary | ICD-10-CM

## 2020-05-01 DIAGNOSIS — G4700 Insomnia, unspecified: Secondary | ICD-10-CM

## 2020-05-01 DIAGNOSIS — L309 Dermatitis, unspecified: Secondary | ICD-10-CM

## 2020-05-01 DIAGNOSIS — J4541 Moderate persistent asthma with (acute) exacerbation: Secondary | ICD-10-CM

## 2020-05-01 DIAGNOSIS — E538 Deficiency of other specified B group vitamins: Secondary | ICD-10-CM | POA: Diagnosis not present

## 2020-05-01 DIAGNOSIS — R5383 Other fatigue: Secondary | ICD-10-CM

## 2020-05-01 LAB — COMPREHENSIVE METABOLIC PANEL
ALT: 21 U/L (ref 0–35)
AST: 21 U/L (ref 0–37)
Albumin: 4.3 g/dL (ref 3.5–5.2)
Alkaline Phosphatase: 69 U/L (ref 39–117)
BUN: 19 mg/dL (ref 6–23)
CO2: 30 mEq/L (ref 19–32)
Calcium: 10.1 mg/dL (ref 8.4–10.5)
Chloride: 102 mEq/L (ref 96–112)
Creatinine, Ser: 1.04 mg/dL (ref 0.40–1.20)
GFR: 51.52 mL/min — ABNORMAL LOW (ref >60.00)
Glucose, Bld: 95 mg/dL (ref 70–99)
Potassium: 4.1 mEq/L (ref 3.5–5.1)
Sodium: 140 mEq/L (ref 135–145)
Total Bilirubin: 0.3 mg/dL (ref 0.2–1.2)
Total Protein: 6.8 g/dL (ref 6.0–8.3)

## 2020-05-01 LAB — LIPID PANEL
Cholesterol: 171 mg/dL (ref 0–200)
HDL: 48.2 mg/dL (ref >39.00)
NonHDL: 122.9
Total CHOL/HDL Ratio: 4
Triglycerides: 290 mg/dL — ABNORMAL HIGH (ref 0.0–149.0)
VLDL: 58 mg/dL — ABNORMAL HIGH (ref 0.0–40.0)

## 2020-05-01 LAB — VITAMIN B12: Vitamin B-12: 192 pg/mL — ABNORMAL LOW (ref 211–911)

## 2020-05-01 LAB — HEMOGLOBIN A1C: Hgb A1c MFr Bld: 6.1 % (ref 4.6–6.5)

## 2020-05-01 LAB — TSH: TSH: 4.35 u[IU]/mL (ref 0.35–4.50)

## 2020-05-01 LAB — LDL CHOLESTEROL, DIRECT: Direct LDL: 93 mg/dL

## 2020-05-01 MED ORDER — SHINGRIX 50 MCG/0.5ML IM SUSR: 0.5000 mL | Freq: Once | INTRAMUSCULAR | 0 refills | Status: AC

## 2020-05-01 NOTE — Progress Notes (Signed)
Kelly Petersen DOB: 1946-07-11 Encounter date: 05/01/2020  This is a 74 y.o. female who presents with Chief Complaint  Patient presents with  . Follow-up    History of present illness: Wondering if she can get another rx for the shingles vaccine but misplaced this; would like this re-printed.   Frustrated with gaining weight. Usually eats breakfast and then again later in the afternoon - healthy choice meals. If out gets [employer] and coke (not diet). At night does some crunches, jumping jacks - 5-10 minutes. Does get some steps in through day.   Usually for breakfast has cheerios with bananas, strawberries, blueberries. Coffee, english muffin. Coffee with light cream.   Started with excema. Has been seeing dermatologist.   Taking thyroid pill with water.   Asthma/allergies: has been stable. Always a struggle. Asmanex,xyzal,singulair. Allergies have been bad. Bad enough in last year she couldn't wear contact lenses. Eyes stinging, watering.  Hyperglycemia:A1C 6.3 on 07/24/19.  HL: on crestor 5mg . Has cut back on sweets and soda.  Hx of depression: on zoloft 25mg .  Mood has been good.  Insomnia: trazodone prn  Eczema: sees [facility] surgery center. Working on treatment for dry skin.  Osteopenia/hx of uterine CA: follows with gyn Dr. Alwyn Pea; on evista since hysterectomy. Last DEXA was with ortho Dr. Cyd Silence 03/2018. On prolia, calcium, vitamin D.  Thyroid: gaining weight since starting synthroid.   Colonoscopy due 12/2023. Has had some food sticking; but doesn't happen often. Sometimes goes away. Not sure last endoscopy (but likely before 2009 since we do not have record). No difficulty with liquids. If she gets it then will last for a couple of days. Sometimes triggered by pills (like calcium tab).  Frustrated with weight.  Tends to eat fast food on a regular basis since this is easy for her to grab and she lives alone.  She is hoping that starting thyroid  medication will help somewhat.    Allergies  Allergen Reactions  . Other Itching    Azogantrazine - Sulfa drug for UTI   Current Meds  Medication Sig  . ASMANEX 120 METERED DOSES 220 MCG/INH inhaler USE 2 INHALATIONS TWICE A  DAY  . Brimonidine Tartrate-Timolol (COMBIGAN OP) Apply 1 drop to eye 2 (two) times daily.  Marland Kitchen CALCIUM PO Take 600 mg by mouth 2 (two) times daily.  . cholecalciferol (VITAMIN D) 1000 units tablet Take 1,000 Units by mouth daily.  . clobetasol (TEMOVATE) 0.05 % external solution Apply 1 application topically as needed.  Marland Kitchen denosumab (PROLIA) 60 MG/ML SOSY injection Inject 60 mg into the skin every 6 (six) months.  . fluocinonide ointment (LIDEX) 0.05 %   . levocetirizine (XYZAL) 5 MG tablet   . montelukast (SINGULAIR) 10 MG tablet TAKE 1 TABLET BY MOUTH  EVERY NIGHT AT BEDTIME  . Pseudoephedrine-Ibuprofen (ADVIL COLD/SINUS PO) Take by mouth as needed.  . raloxifene (EVISTA) 60 MG tablet Take 60 mg by mouth daily.  . rosuvastatin (CRESTOR) 5 MG tablet Take 1 tablet (5 mg total) by mouth daily.  . rosuvastatin (CRESTOR) 5 MG tablet TAKE 1 TABLET BY MOUTH  DAILY  . sertraline (ZOLOFT) 25 MG tablet TAKE 1 TABLET BY MOUTH  DAILY  . SYNTHROID 25 MCG tablet Take 1 tablet (25 mcg total) by mouth daily before breakfast.  . traZODone (DESYREL) 50 MG tablet TAKE 1 TABLET BY MOUTH AT  BEDTIME  . VITAMIN K PO Take by mouth.  . [DISCONTINUED] triamcinolone cream (KENALOG) 0.1 % APP 1 APPLICATION ON THE  SKIN BID PRN UTD    Review of Systems  Constitutional: Negative for activity change, appetite change, chills, fatigue, fever and unexpected weight change.  HENT: Negative for congestion, ear pain, hearing loss, sinus pressure, sinus pain, sore throat and trouble swallowing.   Eyes: Negative for pain and visual disturbance.  Respiratory: Negative for cough, chest tightness, shortness of breath and wheezing.   Cardiovascular: Negative for chest pain, palpitations and leg  swelling.  Gastrointestinal: Negative for abdominal pain, blood in stool, constipation, diarrhea, nausea and vomiting.  Genitourinary: Negative for difficulty urinating and menstrual problem.  Musculoskeletal: Negative for arthralgias and back pain.  Skin: Negative for rash.  Neurological: Negative for dizziness, weakness, numbness and headaches.  Hematological: Negative for adenopathy. Does not bruise/bleed easily.  Psychiatric/Behavioral: Negative for sleep disturbance and suicidal ideas. The patient is not nervous/anxious.     Objective:  BP 108/60 (BP Location: Left Arm, Patient Position: Sitting, Cuff Size: Large)   Pulse 91   Temp 98 F (36.7 C) (Temporal)   Ht 5\' 1"  (1.549 m)   Wt 132 lb 6.4 oz (60.1 kg)   BMI 25.02 kg/m   Weight: 132 lb 6.4 oz (60.1 kg)   BP Readings from Last 3 Encounters:  05/01/20 108/60  10/23/19 100/74  09/20/18 90/60   Wt Readings from Last 3 Encounters:  05/01/20 132 lb 6.4 oz (60.1 kg)  10/23/19 137 lb 12.8 oz (62.5 kg)  09/20/18 129 lb 9.6 oz (58.8 kg)    Physical Exam Constitutional:      General: She is not in acute distress.    Appearance: She is well-developed.  Cardiovascular:     Rate and Rhythm: Normal rate and regular rhythm.     Heart sounds: Normal heart sounds. No murmur heard.  No friction rub.  Pulmonary:     Effort: Pulmonary effort is normal. No respiratory distress.     Breath sounds: Normal breath sounds. No wheezing or rales.  Musculoskeletal:     Right lower leg: No edema.     Left lower leg: No edema.  Neurological:     Mental Status: She is alert and oriented to person, place, and time.  Psychiatric:        Behavior: Behavior normal.     Assessment/Plan  1. Moderate persistent asthma with acute exacerbation Has been well controlled.  Continue with Asmanex, Xyzal, Singulair.  2. Allergic rhinitis, unspecified seasonality, unspecified trigger See above.  Stable.  Continue current medications.  3.  Hypothyroidism, unspecified type We will recheck blood work today.  Currently on 25 mcg daily. - TSH; Future - TSH  4. Osteopenia, unspecified location Currently taking the statin.  Is active, but not participating in regular exercise.  5. Eczema, unspecified type Currently following with dermatology.  Well-controlled.  6. Hyperlipidemia, unspecified hyperlipidemia type Continue with Crestor. - Lipid panel; Future - Comprehensive metabolic panel; Future - Comprehensive metabolic panel - Lipid panel  7. Insomnia, unspecified type Does well with trazodone.  8. Recurrent major depressive disorder, in full remission ([place]) Mood has been stable.  Continue sertraline 25 mg.  9. Hyperglycemia We discussed continuing to cut back on sugary foods, high carbohydrate foods.  We discussed small changes she can make to her diet and reviewed caloric content and carbohydrate content for fast food items that she frequently gets. - Hemoglobin A1c; Future - Hemoglobin A1c  10. Fatigue, unspecified type We will recheck blood work today.  Encourage regular exercise and healthy eating.  11. B12 deficiency -  Vitamin B12; Future - Vitamin B12    Return in about 6 months (around 10/31/2020).    Micheline Rough, MD

## 2020-05-01 NOTE — Patient Instructions (Addendum)
OK if you do not take your thyroid medication at the exact same time every day.   You can move the zoloft to bedtime dosing to help with timing of medications.   OK to use the asmanex in the morning (don't need to wait to use this)  Consider metamucil to help with fiber  Look for "cold infusions" - flavor water

## 2020-07-05 ENCOUNTER — Other Ambulatory Visit: Payer: Self-pay | Admitting: Family Medicine

## 2020-07-14 ENCOUNTER — Other Ambulatory Visit: Payer: Self-pay | Admitting: Family Medicine

## 2020-09-29 ENCOUNTER — Other Ambulatory Visit: Payer: Self-pay

## 2020-09-29 ENCOUNTER — Ambulatory Visit (INDEPENDENT_AMBULATORY_CARE_PROVIDER_SITE_OTHER): Payer: Medicare HMO

## 2020-09-29 VITALS — BP 110/78 | HR 91 | Temp 98.2°F | Resp 20 | Wt 136.0 lb

## 2020-09-29 DIAGNOSIS — Z0000 Encounter for general adult medical examination without abnormal findings: Secondary | ICD-10-CM | POA: Diagnosis not present

## 2020-09-29 NOTE — Progress Notes (Signed)
Subjective:   Kelly Petersen is a 74 y.o. female who presents for Medicare Annual (Subsequent) preventive examination.  Review of Systems     Cardiac Risk Factors include: advanced age (>89men, >15 women);dyslipidemia     Objective:    Today's Vitals   09/29/20 1059  BP: 110/78  Pulse: 91  Resp: 20  Temp: 98.2 F (36.8 C)  SpO2: 97%  Weight: 136 lb (61.7 kg)   Body mass index is 25.7 kg/m.  Advanced Directives 09/29/2020 03/20/2018 12/22/2016  Does Patient Have a Medical Advance Directive? Yes Yes Yes  Type of Paramedic of [place];Living will - -  Copy of [place] in Chart? No - copy requested - -    Current Medications (verified) Outpatient Encounter Medications as of 09/29/2020  Medication Sig  . ASMANEX 120 METERED DOSES 220 MCG/INH inhaler USE 2 INHALATIONS TWICE A  DAY  . Brimonidine Tartrate-Timolol (COMBIGAN OP) Apply 1 drop to eye 2 (two) times daily.  Marland Kitchen CALCIUM PO Take 600 mg by mouth 2 (two) times daily.  . cholecalciferol (VITAMIN D) 1000 units tablet Take 1,000 Units by mouth daily.  . clobetasol (TEMOVATE) 0.05 % external solution Apply 1 application topically as needed.  . Cyanocobalamin (VITAMIN B 12 PO) Take by mouth.  . denosumab (PROLIA) 60 MG/ML SOSY injection Inject 60 mg into the skin every 6 (six) months.  . fluocinonide ointment (LIDEX) 0.05 %   . montelukast (SINGULAIR) 10 MG tablet TAKE 1 TABLET BY MOUTH  EVERY NIGHT AT BEDTIME  . Pseudoephedrine-Ibuprofen (ADVIL COLD/SINUS PO) Take by mouth as needed.  . rosuvastatin (CRESTOR) 5 MG tablet TAKE 1 TABLET BY MOUTH  DAILY  . sertraline (ZOLOFT) 25 MG tablet TAKE 1 TABLET BY MOUTH  DAILY  . SYNTHROID 25 MCG tablet TAKE 1 TABLET(25 MCG) BY MOUTH DAILY BEFORE BREAKFAST  . traZODone (DESYREL) 50 MG tablet TAKE 1 TABLET BY MOUTH AT  BEDTIME  . levocetirizine (XYZAL) 5 MG tablet  (Patient not taking: Reported on 09/29/2020)  . [DISCONTINUED] raloxifene  (EVISTA) 60 MG tablet Take 60 mg by mouth daily. (Patient not taking: Reported on 09/29/2020)  . [DISCONTINUED] VITAMIN K PO Take by mouth. (Patient not taking: Reported on 09/29/2020)   No facility-administered encounter medications on file as of 09/29/2020.    Allergies (verified) Other   History: Past Medical History:  Diagnosis Date  . Adrenal mass ([place]) 01/02/2017   -monitored by endocrinologist in the past -reports told did not need further imaging  . Asthma   . Chicken pox   . Depression   . Eczema   . Endometrial cancer ([place])   . Environmental allergies   . Glaucoma   . Hyperglycemia   . Hyperlipidemia   . Insomnia   . Osteopenia    Past Surgical History:  Procedure Laterality Date  . ABDOMINAL HYSTERECTOMY  1999   endometrial cancer  . BREAST SURGERY  2003   lumpectomy; benign  . CATARACT EXTRACTION     right eye-April 2021--left eye June 2021  . ECTOPIC PREGNANCY SURGERY     Family History  Problem Relation Age of Onset  . Alzheimer's disease Mother 42  . Asthma Mother   . Lung cancer Father 59       worked at [place], [place]  . Cancer Maternal Grandfather 53  . Hyperlipidemia Paternal Grandmother 84  . Lung cancer Paternal Grandfather 41   Social History   Socioeconomic History  . Marital status:  Single    Spouse name: Not on file  . Number of children: Not on file  . Years of education: Not on file  . Highest education level: Not on file  Occupational History  . Occupation: retired  Tobacco Use  . Smoking status: Never Smoker  . Smokeless tobacco: Never Used  Substance and Sexual Activity  . Alcohol use: Yes    Comment: 2 drink rarely  . Drug use: No  . Sexual activity: Not on file  Other Topics Concern  . Not on file  Social History Narrative   Work or School: cares for grandchild      Home Situation: lives alone      Spiritual Beliefs: Christian, no church currently      Lifestyle: no regular exercise; diet is poor       Social Determinants of Radio broadcast assistant Strain: Low Risk   . Difficulty of Paying Living Expenses: Not hard at all  Food Insecurity: No Food Insecurity  . Worried About Charity fundraiser in the Last Year: Never true  . Ran Out of Food in the Last Year: Never true  Transportation Needs: No Transportation Needs  . Lack of Transportation (Medical): No  . Lack of Transportation (Non-Medical): No  Physical Activity: Inactive  . Days of Exercise per Week: 0 days  . Minutes of Exercise per Session: 0 min  Stress: No Stress Concern Present  . Feeling of Stress : Not at all  Social Connections: Socially Isolated  . Frequency of Communication with Friends and Family: More than three times a week  . Frequency of Social Gatherings with Friends and Family: More than three times a week  . Attends Religious Services: Never  . Active Member of Clubs or Organizations: No  . Attends Archivist Meetings: Never  . Marital Status: Never married    Tobacco Counseling Counseling given: Not Answered   Clinical Intake:  Pre-visit preparation completed: Yes  Pain : No/denies pain     BMI - recorded: 25.7 Nutritional Status: BMI 25 -29 Overweight Nutritional Risks: None Diabetes: No  How often do you need to have someone help you when you read instructions, pamphlets, or other written materials from your doctor or pharmacy?: 1 - Never  Diabetic?No  Interpreter Needed?: No  Information entered by :: Charlott Rakes, LPN   Activities of Daily Living In your present state of health, do you have any difficulty performing the following activities: 09/29/2020  Hearing? N  Vision? N  Difficulty concentrating or making decisions? N  Walking or climbing stairs? N  Dressing or bathing? N  Doing errands, shopping? N  Preparing Food and eating ? N  Using the Toilet? N  In the past six months, have you accidently leaked urine? Y  Comment sneezing a little may come out  wears a pantyliner for saftey  Do you have problems with loss of bowel control? N  Managing your Medications? N  Managing your Finances? N  Housekeeping or managing your Housekeeping? N  Some recent data might be hidden    Patient Care Team: Caren Macadam, MD as PCP - General (Family Medicine) Sanjuana Kava, MD as Referring Physician (Obstetrics and Gynecology) Hillery Jacks, MD as Referring Physician (Dermatology)  Indicate any recent Medical Services you may have received from other than [facility] providers in the past year (date may be approximate).     Assessment:   This is a routine wellness examination for [place].  Hearing/Vision screen  Hearing Screening   125Hz  250Hz  500Hz  1000Hz  2000Hz  3000Hz  4000Hz  6000Hz  8000Hz   Right ear:           Left ear:           Comments: Pt denies any hearing issues   Vision Screening Comments: Pt follows Dr Prudencio Burly for eye exams annually  Dietary issues and exercise activities discussed: Current Exercise Habits: The patient does not participate in regular exercise at present  Goals    . Eat more fruits and vegetables     Pick up vegetables farmer's  Snack on them Eat more fruit Cut up some vegetables one day and have them ready in the frig Not eat as much fast foods or make different choices one day a week      . Patient Stated     fx to heal and go on cruise this year     . Patient Stated     Lose weight      Depression Screen PHQ 2/9 Scores 09/29/2020 10/23/2019 07/31/2019 03/20/2018 12/22/2016 08/19/2015 08/08/2014  PHQ - 2 Score 0 0 0 0 0 0 0  PHQ- 9 Score - 6 0 - - - -    Fall Risk Fall Risk  09/29/2020 03/20/2018 12/22/2016 08/19/2015 08/08/2014  Falls in the past year? 0 No (No Data) No Yes  Comment - - slipped x 1;  - -  Number falls in past yr: 0 - - - 1  Injury with Fall? 0 - - - Yes  Risk for fall due to : Impaired balance/gait;Impaired mobility - - - -  Follow up Falls prevention discussed - - - -    Any stairs  in or around the home? Yes  If so, are there any without handrails? No  Home free of loose throw rugs in walkways, pet beds, electrical cords, etc? Yes  Adequate lighting in your home to reduce risk of falls? Yes   ASSISTIVE DEVICES UTILIZED TO PREVENT FALLS:  Life alert? No  Use of a cane, walker or w/c? No  Grab bars in the bathroom? No  Shower chair or bench in shower? No  Elevated toilet seat or a handicapped toilet? Yes   TIMED UP AND GO:  Was the test performed? Yes .  Length of time to ambulate 10 feet: 10 sec.   Gait steady and fast without use of assistive device  Cognitive Function:     6CIT Screen 09/29/2020 03/20/2018  What Year? 0 points 0 points  What month? 0 points 0 points  What time? - 0 points  Count back from 20 0 points 0 points  Months in reverse 0 points 0 points  Repeat phrase 0 points 0 points  Total Score - 0    Immunizations Immunization History  Administered Date(s) Administered  . Influenza Split 06/27/2013  . Influenza, High Dose Seasonal PF 08/08/2014, 08/03/2015, 07/18/2016, 08/21/2017, 07/05/2019  . Influenza-Unspecified 08/15/2018, 06/25/2019, 08/07/2020  . Moderna SARS-COVID-2 Vaccination 12/09/2019, 01/07/2020, 09/23/2020  . Pneumococcal Conjugate-13 08/08/2014  . Pneumococcal Polysaccharide-23 05/15/2003, 11/07/2008, 12/09/2015  . Td 05/11/2003  . Tdap 11/24/2010  . Zoster 11/15/2011  . Zoster Recombinat (Shingrix) 06/22/2020, 09/01/2020    TDAP status: Up to date Flu Vaccine status: Up to date Pneumococcal vaccine status: Up to date Covid-19 vaccine status: Completed vaccines  Qualifies for Shingles Vaccine? Yes   Zostavax completed Yes   Shingrix Completed?: Yes  Screening Tests Health Maintenance  Topic Date Due  . TETANUS/TDAP  11/24/2020  .  DEXA SCAN  03/20/2022  . INFLUENZA VACCINE  Completed  . COVID-19 Vaccine  Completed  . Hepatitis C Screening  Completed  . PNA vac Low Risk Adult  Completed    Health  Maintenance  There are no preventive care reminders to display for this patient.  Colorectal cancer screening: Completed 12/23/13. Repeat every no longer showing  years Mammogram status: Completed 12/08/17. Repeat every year Bone Density status: Completed 03/20/20. Results reflect: Bone density results: OSTEOPENIA. Repeat every 2 years.   Additional Screening:  Hepatitis C Screening:  Completed 12/14/15  Vision Screening: Recommended annual ophthalmology exams for early detection of glaucoma and other disorders of the eye. Is the patient up to date with their annual eye exam?  Yes  Who is the provider or what is the name of the office in which the patient attends annual eye exams? Dr Prudencio Burly   Dental Screening: Recommended annual dental exams for proper oral hygiene  Community Resource Referral / Chronic Care Management: CRR required this visit?  No   CCM required this visit?  No      Plan:     I have personally reviewed and noted the following in the patient's chart:   . Medical and social history . Use of alcohol, tobacco or illicit drugs  . Current medications and supplements . Functional ability and status . Nutritional status . Physical activity . Advanced directives . List of other physicians . Hospitalizations, surgeries, and ER visits in previous 12 months . Vitals . Screenings to include cognitive, depression, and falls . Referrals and appointments  In addition, I have reviewed and discussed with patient certain preventive protocols, quality metrics, and best practice recommendations. A written personalized care plan for preventive services as well as general preventive health recommendations were provided to patient.     Willette Brace, LPN   [id number]   Nurse Notes: None

## 2020-09-29 NOTE — Patient Instructions (Addendum)
Kelly Petersen , Thank you for taking time to come for your Medicare Wellness Visit. I appreciate your ongoing commitment to your health goals. Please review the following plan we discussed and let me know if I can assist you in the future.   Screening recommendations/referrals: Colonoscopy: Done 12/23/2013 Mammogram: Done 12/08/17 Bone Density: Done 03/20/20 Recommended yearly ophthalmology/optometry visit for glaucoma screening and checkup Recommended yearly dental visit for hygiene and checkup  Vaccinations: Influenza vaccine: Done 08/07/20 Up to date Pneumococcal vaccine: Up to date Tdap vaccine: Up to date Shingles vaccine: Completed 06/22/20 7 09/01/20   Covid-19:Completed 2/1, 3/2, & 09/1720  Advanced directives: Please bring a copy of your health care power of attorney and living will to the office at your convenience.  Conditions/risks identified: lose weight   Next appointment: Follow up in one year for your annual wellness visit    Preventive Care 76 Years and Older, Female Preventive care refers to lifestyle choices and visits with your health care provider that can promote health and wellness. What does preventive care include?  A yearly physical exam. This is also called an annual well check.  Dental exams once or twice a year.  Routine eye exams. Ask your health care provider how often you should have your eyes checked.  Personal lifestyle choices, including:  Daily care of your teeth and gums.  Regular physical activity.  Eating a healthy diet.  Avoiding tobacco and drug use.  Limiting alcohol use.  Practicing safe sex.  Taking low-dose aspirin every day.  Taking vitamin and mineral supplements as recommended by your health care provider. What happens during an annual well check? The services and screenings done by your health care provider during your annual well check will depend on your age, overall health, lifestyle risk factors, and family history of  disease. Counseling  Your health care provider may ask you questions about your:  Alcohol use.  Tobacco use.  Drug use.  Emotional well-being.  Home and relationship well-being.  Sexual activity.  Eating habits.  History of falls.  Memory and ability to understand (cognition).  Work and work Statistician.  Reproductive health. Screening  You may have the following tests or measurements:  Height, weight, and BMI.  Blood pressure.  Lipid and cholesterol levels. These may be checked every 5 years, or more frequently if you are over 76 years old.  Skin check.  Lung cancer screening. You may have this screening every year starting at age 76 if you have a 30-pack-year history of smoking and currently smoke or have quit within the past 15 years.  Fecal occult blood test (FOBT) of the stool. You may have this test every year starting at age 76.  Flexible sigmoidoscopy or colonoscopy. You may have a sigmoidoscopy every 5 years or a colonoscopy every 10 years starting at age 76.  Hepatitis C blood test.  Hepatitis B blood test.  Sexually transmitted disease (STD) testing.  Diabetes screening. This is done by checking your blood sugar (glucose) after you have not eaten for a while (fasting). You may have this done every 1-3 years.  Bone density scan. This is done to screen for osteoporosis. You may have this done starting at age 76.  Mammogram. This may be done every 1-2 years. Talk to your health care provider about how often you should have regular mammograms. Talk with your health care provider about your test results, treatment options, and if necessary, the need for more tests. Vaccines  Your health care  provider may recommend certain vaccines, such as:  Influenza vaccine. This is recommended every year.  Tetanus, diphtheria, and acellular pertussis (Tdap, Td) vaccine. You may need a Td booster every 10 years.  Zoster vaccine. You may need this after age  76.  Pneumococcal 13-valent conjugate (PCV13) vaccine. One dose is recommended after age 76.  Pneumococcal polysaccharide (PPSV23) vaccine. One dose is recommended after age 76. Talk to your health care provider about which screenings and vaccines you need and how often you need them. This information is not intended to replace advice given to you by your health care provider. Make sure you discuss any questions you have with your health care provider. Document Released: 11/20/2015 Document Revised: 07/13/2016 Document Reviewed: 08/25/2015 Elsevier Interactive Patient Education  2017 [place] Prevention in the Home Falls can cause injuries. They can happen to people of all ages. There are many things you can do to make your home safe and to help prevent falls. What can I do on the outside of my home?  Regularly fix the edges of walkways and driveways and fix any cracks.  Remove anything that might make you trip as you walk through a door, such as a raised step or threshold.  Trim any bushes or trees on the path to your home.  Use bright outdoor lighting.  Clear any walking paths of anything that might make someone trip, such as rocks or tools.  Regularly check to see if handrails are loose or broken. Make sure that both sides of any steps have handrails.  Any raised decks and porches should have guardrails on the edges.  Have any leaves, snow, or ice cleared regularly.  Use sand or salt on walking paths during winter.  Clean up any spills in your garage right away. This includes oil or grease spills. What can I do in the bathroom?  Use night lights.  Install grab bars by the toilet and in the tub and shower. Do not use towel bars as grab bars.  Use non-skid mats or decals in the tub or shower.  If you need to sit down in the shower, use a plastic, non-slip stool.  Keep the floor dry. Clean up any water that spills on the floor as soon as it happens.  Remove  soap buildup in the tub or shower regularly.  Attach bath mats securely with double-sided non-slip rug tape.  Do not have throw rugs and other things on the floor that can make you trip. What can I do in the bedroom?  Use night lights.  Make sure that you have a light by your bed that is easy to reach.  Do not use any sheets or blankets that are too big for your bed. They should not hang down onto the floor.  Have a firm chair that has side arms. You can use this for support while you get dressed.  Do not have throw rugs and other things on the floor that can make you trip. What can I do in the kitchen?  Clean up any spills right away.  Avoid walking on wet floors.  Keep items that you use a lot in easy-to-reach places.  If you need to reach something above you, use a strong step stool that has a grab bar.  Keep electrical cords out of the way.  Do not use floor polish or wax that makes floors slippery. If you must use wax, use non-skid floor wax.  Do not have throw  rugs and other things on the floor that can make you trip. What can I do with my stairs?  Do not leave any items on the stairs.  Make sure that there are handrails on both sides of the stairs and use them. Fix handrails that are broken or loose. Make sure that handrails are as long as the stairways.  Check any carpeting to make sure that it is firmly attached to the stairs. Fix any carpet that is loose or worn.  Avoid having throw rugs at the top or bottom of the stairs. If you do have throw rugs, attach them to the floor with carpet tape.  Make sure that you have a light switch at the top of the stairs and the bottom of the stairs. If you do not have them, ask someone to add them for you. What else can I do to help prevent falls?  Wear shoes that:  Do not have high heels.  Have rubber bottoms.  Are comfortable and fit you well.  Are closed at the toe. Do not wear sandals.  If you use a  stepladder:  Make sure that it is fully opened. Do not climb a closed stepladder.  Make sure that both sides of the stepladder are locked into place.  Ask someone to hold it for you, if possible.  Clearly mark and make sure that you can see:  Any grab bars or handrails.  First and last steps.  Where the edge of each step is.  Use tools that help you move around (mobility aids) if they are needed. These include:  Canes.  Walkers.  Scooters.  Crutches.  Turn on the lights when you go into a dark area. Replace any light bulbs as soon as they burn out.  Set up your furniture so you have a clear path. Avoid moving your furniture around.  If any of your floors are uneven, fix them.  If there are any pets around you, be aware of where they are.  Review your medicines with your doctor. Some medicines can make you feel dizzy. This can increase your chance of falling. Ask your doctor what other things that you can do to help prevent falls. This information is not intended to replace advice given to you by your health care provider. Make sure you discuss any questions you have with your health care provider. Document Released: 08/20/2009 Document Revised: 03/31/2016 Document Reviewed: 11/28/2014 Elsevier Interactive Patient Education  2017 [employer].

## 2020-10-09 ENCOUNTER — Other Ambulatory Visit: Payer: Self-pay | Admitting: Family Medicine

## 2020-10-09 MED ORDER — SYNTHROID 25 MCG PO TABS: ORAL_TABLET | ORAL | 1 refills | Status: DC

## 2020-10-09 NOTE — Telephone Encounter (Signed)
Rx done. 

## 2020-10-09 NOTE — Telephone Encounter (Signed)
Pt is calling in stating that she only has 8 pills of Rx SYNTHROID 25 MCG and will not have enough til her appt Pharm:  [facility] [address]  [phone]

## 2020-11-02 ENCOUNTER — Encounter: Payer: Self-pay | Admitting: Family Medicine

## 2020-11-02 ENCOUNTER — Other Ambulatory Visit: Payer: Self-pay

## 2020-11-02 ENCOUNTER — Ambulatory Visit: Payer: Medicare HMO | Admitting: Family Medicine

## 2020-11-02 VITALS — BP 100/56 | HR 81 | Temp 98.3°F | Ht 61.0 in | Wt 135.2 lb

## 2020-11-02 DIAGNOSIS — J4541 Moderate persistent asthma with (acute) exacerbation: Secondary | ICD-10-CM | POA: Diagnosis not present

## 2020-11-02 DIAGNOSIS — R739 Hyperglycemia, unspecified: Secondary | ICD-10-CM

## 2020-11-02 DIAGNOSIS — E538 Deficiency of other specified B group vitamins: Secondary | ICD-10-CM

## 2020-11-02 DIAGNOSIS — E039 Hypothyroidism, unspecified: Secondary | ICD-10-CM | POA: Diagnosis not present

## 2020-11-02 DIAGNOSIS — J309 Allergic rhinitis, unspecified: Secondary | ICD-10-CM | POA: Diagnosis not present

## 2020-11-02 DIAGNOSIS — E785 Hyperlipidemia, unspecified: Secondary | ICD-10-CM | POA: Diagnosis not present

## 2020-11-02 DIAGNOSIS — J4540 Moderate persistent asthma, uncomplicated: Secondary | ICD-10-CM

## 2020-11-02 LAB — COMPREHENSIVE METABOLIC PANEL
ALT: 27 U/L (ref 0–35)
AST: 29 U/L (ref 0–37)
Albumin: 4.1 g/dL (ref 3.5–5.2)
Alkaline Phosphatase: 62 U/L (ref 39–117)
BUN: 15 mg/dL (ref 6–23)
CO2: 30 mEq/L (ref 19–32)
Calcium: 10.1 mg/dL (ref 8.4–10.5)
Chloride: 103 mEq/L (ref 96–112)
Creatinine, Ser: 1.05 mg/dL (ref 0.40–1.20)
GFR: 51.61 mL/min — ABNORMAL LOW (ref >60.00)
Glucose, Bld: 88 mg/dL (ref 70–99)
Potassium: 4.1 mEq/L (ref 3.5–5.1)
Sodium: 139 mEq/L (ref 135–145)
Total Bilirubin: 0.3 mg/dL (ref 0.2–1.2)
Total Protein: 7 g/dL (ref 6.0–8.3)

## 2020-11-02 LAB — TSH: TSH: 4.23 u[IU]/mL (ref 0.35–4.50)

## 2020-11-02 LAB — LIPID PANEL
Cholesterol: 172 mg/dL (ref 0–200)
HDL: 53.5 mg/dL (ref >39.00)
NonHDL: 118.72
Total CHOL/HDL Ratio: 3
Triglycerides: 314 mg/dL — ABNORMAL HIGH (ref 0.0–149.0)
VLDL: 62.8 mg/dL — ABNORMAL HIGH (ref 0.0–40.0)

## 2020-11-02 LAB — LDL CHOLESTEROL, DIRECT: Direct LDL: 88 mg/dL

## 2020-11-02 LAB — VITAMIN B12: Vitamin B-12: 580 pg/mL (ref 211–911)

## 2020-11-02 NOTE — Progress Notes (Signed)
Kelly Petersen DOB: [birth year] Encounter date: 11/02/2020  This is a 74 y.o. female who presents with Chief Complaint  Patient presents with  . Follow-up    History of present illness:  Still frustrated with weight.   Thinks she needs to take something to regular bowels. She can either go little pebbles through day or can be soft. Does go most days.   Taking synthroid - usually at 4-5 am when she wakes to go to the bathroom daily.   Exczema has cleared up since last visit.   Allergies have been a little better - sneezes more. But congestion and eyes are feeling better.   Hyperglycemia:"I'm not a good eater" - has cheerios, english muffin, fruit for breakfast. Still having burgers fast food for lunch; but only eating the burger. Gets milk shake with this as well.   HL: on crestor 5mg . Does get small coke at . If she has lunch at home - drinks lemonade-iced mix.   Hx of depression: on zoloft 25mg .  Mood has been good. This has been stable. Hasn't tried to stop, but just taking 1/2 tab (12.5mg ).   Insomnia: trazodone prn; breaks in half if she uses (25mg ); maybe uses 2-3 times/week.   Osteopenia/hx of uterine CA: follows with gyn Dr. [employer]; on evista since hysterectomy. Last DEXA was with ortho Dr. 03/2018. On prolia, calcium, vitamin D.   Colonoscopy due 12/2023. Has had some food sticking; but doesn't happen often. Sometimes goes away. Not sure last endoscopy (but likely before 2009 since we do not have record). No difficulty with liquids. If she gets it then will last for a couple of days. Sometimes triggered by pills (like calcium tab).    Allergies  Allergen Reactions  . Other Itching    Azogantrazine - Sulfa drug for UTI   Current Meds  Medication Sig  . ASMANEX 120 METERED DOSES 220 MCG/INH inhaler USE 2 INHALATIONS TWICE A  DAY  . Brimonidine Tartrate-Timolol (COMBIGAN OP) Apply 1 drop to eye 2 (two) times daily.  04/2018 CALCIUM PO Take 600  mg by mouth 2 (two) times daily.  . cholecalciferol (VITAMIN D) 1000 units tablet Take 1,000 Units by mouth daily.  . Cyanocobalamin (VITAMIN B 12 PO) Take by mouth.  . denosumab (PROLIA) 60 MG/ML SOSY injection Inject 60 mg into the skin every 6 (six) months.  . levocetirizine (XYZAL) 5 MG tablet   . montelukast (SINGULAIR) 10 MG tablet TAKE 1 TABLET BY MOUTH  EVERY NIGHT AT BEDTIME  . Pseudoephedrine-Ibuprofen (ADVIL COLD/SINUS PO) Take by mouth as needed.  . rosuvastatin (CRESTOR) 5 MG tablet TAKE 1 TABLET BY MOUTH  DAILY  . sertraline (ZOLOFT) 25 MG tablet TAKE 1 TABLET BY MOUTH  DAILY  . SYNTHROID 25 MCG tablet TAKE 1 TABLET(25 MCG) BY MOUTH DAILY BEFORE BREAKFAST  . traZODone (DESYREL) 50 MG tablet TAKE 1 TABLET BY MOUTH AT  BEDTIME (Patient taking differently: at bedtime as needed.)    Review of Systems  Objective:  BP (!) 100/56 (BP Location: Right Arm, Patient Position: Sitting, Cuff Size: Normal)   Pulse 81   Temp 98.3 F (36.8 C) (Oral)   Ht 5\' 1"  (1.549 m)   Wt 135 lb 3.2 oz (61.3 kg)   BMI 25.55 kg/m   Weight: 135 lb 3.2 oz (61.3 kg)   BP Readings from Last 3 Encounters:  11/02/20 (!) 100/56  09/29/20 110/78  05/01/20 108/60   Wt Readings from Last 3 Encounters:  11/02/20 135  lb 3.2 oz (61.3 kg)  09/29/20 136 lb (61.7 kg)  05/01/20 132 lb 6.4 oz (60.1 kg)    Physical Exam Constitutional:      General: She is not in acute distress.    Appearance: She is well-developed.  Cardiovascular:     Rate and Rhythm: Normal rate and regular rhythm.     Heart sounds: Normal heart sounds. No murmur heard. No friction rub.  Pulmonary:     Effort: Pulmonary effort is normal. No respiratory distress.     Breath sounds: Normal breath sounds. No wheezing or rales.  Musculoskeletal:     Right lower leg: No edema.     Left lower leg: No edema.  Neurological:     Mental Status: She is alert and oriented to person, place, and time.  Psychiatric:        Behavior: Behavior  normal.     Assessment/Plan  1. Moderate persistent asthma  Does well with regular inhaler use and allergy control.  Continue Singulair 10 mg, Xyzal 5 mg, Asmanex 240 mcg inhaled twice daily.  2. Allergic rhinitis, unspecified seasonality, unspecified trigger Well-controlled.  See above. - CBC with Differential/Platelet; Future  3. Hypothyroidism, unspecified type Has tolerated Synthroid well.  Continue this. - US THYROID; Future  4. Hyperlipidemia, unspecified hyperlipidemia type Continue Crestor 5 mg daily.  Recheck levels today. - Comprehensive metabolic panel; Future - Lipid panel; Future  5. Hyperglycemia She has a hard time sticking with her low carbohydrate diet.  We discussed small changes including increased water intake and ordering small drinks (milkshakes/soda) when she indulges in these. - Hemoglobin A1c; Future   Return in about 6 months (around 05/03/2021).     Theodis Shove, MD

## 2020-11-02 NOTE — Patient Instructions (Addendum)
Metamucil or citracel.   Cold infusions; propel packets; sparkling flavored water to help with getting more water.   Try to decrease zoloft - start with every other day of half tablet x 2 weeks, and then try to take 1/4 of tablet every other day x 1 week; then stop.

## 2020-11-03 ENCOUNTER — Other Ambulatory Visit: Payer: Self-pay | Admitting: Family Medicine

## 2020-11-03 DIAGNOSIS — G4700 Insomnia, unspecified: Secondary | ICD-10-CM

## 2020-11-20 ENCOUNTER — Other Ambulatory Visit: Payer: Medicare HMO

## 2020-12-02 ENCOUNTER — Ambulatory Visit
Admission: RE | Admit: 2020-12-02 | Discharge: 2020-12-02 | Disposition: A | Discharge status: Home / Self Care | Payer: Medicare HMO | Source: Ambulatory Visit | Attending: Family Medicine | Admitting: Family Medicine

## 2020-12-02 DIAGNOSIS — E039 Hypothyroidism, unspecified: Secondary | ICD-10-CM

## 2020-12-04 ENCOUNTER — Telehealth: Payer: Self-pay | Admitting: Family Medicine

## 2020-12-04 NOTE — Telephone Encounter (Signed)
Patient informed of the results

## 2020-12-04 NOTE — Telephone Encounter (Signed)
Patient would like someone to call her regarding the results of her ultrasound.  She requests a detailed message if she does not answer.  Please advise.

## 2020-12-20 ENCOUNTER — Other Ambulatory Visit: Payer: Self-pay | Admitting: Family Medicine

## 2021-01-22 ENCOUNTER — Encounter: Payer: Self-pay | Admitting: Family Medicine

## 2021-04-02 ENCOUNTER — Telehealth: Payer: Medicare HMO | Admitting: Internal Medicine

## 2021-04-12 ENCOUNTER — Telehealth: Payer: Self-pay | Admitting: Family Medicine

## 2021-04-12 MED ORDER — SYNTHROID 25 MCG PO TABS: ORAL_TABLET | ORAL | 1 refills | Status: DC

## 2021-04-12 NOTE — Telephone Encounter (Signed)
Rx done. 

## 2021-04-12 NOTE — Telephone Encounter (Signed)
Pt is calling in needing a refill on Rx SYNTHROID 25 MCG #90 only have 6 pills left.   Pharm:  [facility] on [address]  [phone]

## 2021-04-21 ENCOUNTER — Other Ambulatory Visit: Payer: Self-pay | Admitting: Family Medicine

## 2021-04-21 DIAGNOSIS — G4700 Insomnia, unspecified: Secondary | ICD-10-CM

## 2021-04-21 MED ORDER — TRAZODONE HCL 50 MG PO TABS: 25.0000 mg | ORAL_TABLET | Freq: Every evening | ORAL | 3 refills | Status: DC | PRN

## 2021-04-22 ENCOUNTER — Other Ambulatory Visit: Payer: Self-pay | Admitting: *Deleted

## 2021-04-22 MED ORDER — SYNTHROID 25 MCG PO TABS: ORAL_TABLET | ORAL | 1 refills | Status: DC

## 2021-04-22 NOTE — Telephone Encounter (Signed)
Rx done. 

## 2021-05-03 ENCOUNTER — Ambulatory Visit: Payer: Medicare HMO | Admitting: Family Medicine

## 2021-05-12 ENCOUNTER — Other Ambulatory Visit: Payer: Self-pay

## 2021-05-12 ENCOUNTER — Ambulatory Visit (INDEPENDENT_AMBULATORY_CARE_PROVIDER_SITE_OTHER): Payer: Medicare HMO | Admitting: Family Medicine

## 2021-05-12 ENCOUNTER — Encounter: Payer: Self-pay | Admitting: Family Medicine

## 2021-05-12 VITALS — BP 100/58 | HR 74 | Temp 98.0°F | Ht 61.0 in | Wt 123.8 lb

## 2021-05-12 DIAGNOSIS — E039 Hypothyroidism, unspecified: Secondary | ICD-10-CM

## 2021-05-12 DIAGNOSIS — M8580 Other specified disorders of bone density and structure, unspecified site: Secondary | ICD-10-CM | POA: Diagnosis not present

## 2021-05-12 DIAGNOSIS — J309 Allergic rhinitis, unspecified: Secondary | ICD-10-CM | POA: Diagnosis not present

## 2021-05-12 DIAGNOSIS — E785 Hyperlipidemia, unspecified: Secondary | ICD-10-CM | POA: Diagnosis not present

## 2021-05-12 DIAGNOSIS — G4700 Insomnia, unspecified: Secondary | ICD-10-CM

## 2021-05-12 DIAGNOSIS — E538 Deficiency of other specified B group vitamins: Secondary | ICD-10-CM

## 2021-05-12 DIAGNOSIS — J4540 Moderate persistent asthma, uncomplicated: Secondary | ICD-10-CM

## 2021-05-12 DIAGNOSIS — E875 Hyperkalemia: Secondary | ICD-10-CM

## 2021-05-12 DIAGNOSIS — R739 Hyperglycemia, unspecified: Secondary | ICD-10-CM

## 2021-05-12 DIAGNOSIS — L309 Dermatitis, unspecified: Secondary | ICD-10-CM

## 2021-05-12 LAB — COMPREHENSIVE METABOLIC PANEL
ALT: 20 U/L (ref 0–35)
AST: 24 U/L (ref 0–37)
Albumin: 4.3 g/dL (ref 3.5–5.2)
Alkaline Phosphatase: 57 U/L (ref 39–117)
BUN: 20 mg/dL (ref 6–23)
CO2: 30 mEq/L (ref 19–32)
Calcium: 10.1 mg/dL (ref 8.4–10.5)
Chloride: 102 mEq/L (ref 96–112)
Creatinine, Ser: 1.16 mg/dL (ref 0.40–1.20)
GFR: 45.62 mL/min — ABNORMAL LOW (ref >60.00)
Glucose, Bld: 82 mg/dL (ref 70–99)
Potassium: 5.3 mEq/L — ABNORMAL HIGH (ref 3.5–5.1)
Sodium: 137 mEq/L (ref 135–145)
Total Bilirubin: 0.4 mg/dL (ref 0.2–1.2)
Total Protein: 7.2 g/dL (ref 6.0–8.3)

## 2021-05-12 LAB — LIPID PANEL
Cholesterol: 185 mg/dL (ref 0–200)
HDL: 54.6 mg/dL (ref >39.00)
NonHDL: 130.31
Total CHOL/HDL Ratio: 3
Triglycerides: 211 mg/dL — ABNORMAL HIGH (ref 0.0–149.0)
VLDL: 42.2 mg/dL — ABNORMAL HIGH (ref 0.0–40.0)

## 2021-05-12 LAB — CBC WITH DIFFERENTIAL/PLATELET
Basophils Absolute: 0.1 10*3/uL (ref 0.0–0.1)
Basophils Relative: 1.4 % (ref 0.0–3.0)
Eosinophils Absolute: 1 10*3/uL — ABNORMAL HIGH (ref 0.0–0.7)
Eosinophils Relative: 10.8 % — ABNORMAL HIGH (ref 0.0–5.0)
HCT: 41 % (ref 36.0–46.0)
Hemoglobin: 13.8 g/dL (ref 12.0–15.0)
Lymphocytes Relative: 28.7 % (ref 12.0–46.0)
Lymphs Abs: 2.6 10*3/uL (ref 0.7–4.0)
MCHC: 33.7 g/dL (ref 30.0–36.0)
MCV: 94.9 fl (ref 78.0–100.0)
Monocytes Absolute: 0.8 10*3/uL (ref 0.1–1.0)
Monocytes Relative: 9.2 % (ref 3.0–12.0)
Neutro Abs: 4.4 10*3/uL (ref 1.4–7.7)
Neutrophils Relative %: 49.9 % (ref 43.0–77.0)
Platelets: 264 10*3/uL (ref 150.0–400.0)
RBC: 4.31 Mil/uL (ref 3.87–5.11)
RDW: 13.7 % (ref 11.5–15.5)
WBC: 8.9 10*3/uL (ref 4.0–10.5)

## 2021-05-12 LAB — TSH: TSH: 5 u[IU]/mL (ref 0.35–5.50)

## 2021-05-12 LAB — VITAMIN B12: Vitamin B-12: 628 pg/mL (ref 211–911)

## 2021-05-12 LAB — LDL CHOLESTEROL, DIRECT: Direct LDL: 106 mg/dL

## 2021-05-12 NOTE — Patient Instructions (Signed)
Get Tdap at pharmacy (tetanus shot)

## 2021-05-12 NOTE — Progress Notes (Signed)
Kelly Petersen DOB: 1946-07-22 Encounter date: 05/12/2021  This is a 75 y.o. female who presents with Chief Complaint  Patient presents with   Follow-up    History of present illness: Last visit was 10/2020.   Did get COVID in May from grandson that she was taking care of. Just tired after for a few weeks, but feels better now.   Has wedding coming up that she wants to loose a couple of pounds for. She has a supplement she would like to take (keto boost). Has cut out fast foods, cut portions in half.   HL: crestor 5mg  daily  Hx of depression: stable on zoloft 25mg . Feels that mood is good overall.   Hyperglycemia - she is working on being better with limiting carbohydrates. She has cut out milkshakes since her last visit. She cut out sweet tea as well (doesn't like regular tea though)   Asthma - seems worse in morning/night. Down steps or hills is fine. Coming up hills/steps then feels breathing is harder - worse in heat. Breathing stable, not worse. Still using asmanex BID. Follows with allergy. Used to wake and cough, wheeze but doesn't do that anymore. She does feel since covid she is sneezing. Still taking xyzal, singulair.   Hypothyroid: Wakes 3 times to go to the bathroom. Takes synthroid at 2am when she gets up to go to the bathroom. Goes a lot during day, but always has. Does wear panty liner. Wonders if small leaking, but no discomfort.   Trazodone still working for sleep just as needed. Half tab.  Allergies  Allergen Reactions   Other Itching    Azogantrazine - Sulfa drug for UTI   Current Meds  Medication Sig   ASMANEX 120 METERED DOSES 220 MCG/INH inhaler USE 2 INHALATIONS TWICE A  DAY   Brimonidine Tartrate-Timolol (COMBIGAN OP) Apply 1 drop to eye 2 (two) times daily.   CALCIUM PO Take 600 mg by mouth 2 (two) times daily.   Cyanocobalamin (VITAMIN B 12 PO) Take by mouth.   denosumab (PROLIA) 60 MG/ML SOSY injection Inject 60 mg into the skin every 6 (six) months.    levocetirizine (XYZAL) 5 MG tablet    montelukast (SINGULAIR) 10 MG tablet TAKE 1 TABLET BY MOUTH  EVERY NIGHT AT BEDTIME   Pseudoephedrine-Ibuprofen (ADVIL COLD/SINUS PO) Take by mouth as needed.   rosuvastatin (CRESTOR) 5 MG tablet TAKE 1 TABLET BY MOUTH  DAILY   sertraline (ZOLOFT) 25 MG tablet TAKE 1 TABLET BY MOUTH  DAILY (Patient taking differently: Take 12.5 mg by mouth daily.)   SYNTHROID 25 MCG tablet TAKE 1 TABLET(25 MCG) BY MOUTH DAILY BEFORE BREAKFAST   traZODone (DESYREL) 50 MG tablet Take 0.5-1 tablets (25-50 mg total) by mouth at bedtime as needed.   VITAMIN D PO Take 500 Units by mouth daily.    Review of Systems  Constitutional:  Negative for chills, fatigue and fever.  Respiratory:  Negative for cough, chest tightness, shortness of breath and wheezing.   Cardiovascular:  Negative for chest pain, palpitations and leg swelling.  Genitourinary:  Positive for frequency (has always has frequency; also working on drinking more water).   Objective:  BP (!) 100/58 (BP Location: Left Arm, Patient Position: Sitting, Cuff Size: Normal)   Pulse 74   Temp 98 F (36.7 C) (Oral)   Ht 5\' 1"  (1.549 m)   Wt 123 lb 12.8 oz (56.2 kg)   SpO2 98%   BMI 23.39 kg/m   Weight: 123 lb 12.8  oz (56.2 kg)   BP Readings from Last 3 Encounters:  05/12/21 (!) 100/58  11/02/20 (!) 100/56  09/29/20 110/78   Wt Readings from Last 3 Encounters:  05/12/21 123 lb 12.8 oz (56.2 kg)  11/02/20 135 lb 3.2 oz (61.3 kg)  09/29/20 136 lb (61.7 kg)    Physical Exam Constitutional:      General: She is not in acute distress.    Appearance: She is well-developed.  Cardiovascular:     Rate and Rhythm: Normal rate and regular rhythm.     Heart sounds: Normal heart sounds. No murmur heard.   No friction rub.  Pulmonary:     Effort: Pulmonary effort is normal. No respiratory distress.     Breath sounds: Normal breath sounds. No wheezing or rales.  Musculoskeletal:     Right lower leg: No edema.      Left lower leg: No edema.  Neurological:     Mental Status: She is alert and oriented to person, place, and time.  Psychiatric:        Behavior: Behavior normal.    Assessment/Plan 1. Moderate persistent asthma, unspecified whether complicated Follows with allergy; she is stable with sx on asthmanex. Encouraged her to work on adding in more steps daily - ok to walk on flat ground in store or inside gym.   2. Allergic rhinitis, unspecified seasonality, unspecified trigger Stable with xyzal, singulair.  - CBC with Differential/Platelet; Future - CBC with Differential/Platelet  3. Hypothyroidism, unspecified type Has been stable on current synthroid dose.  - TSH; Future - TSH  4. Osteopenia, unspecified location Follows with gyn, on prolia, calcium, vitamin D. Encouraged weight bearing exercise to help maintain density.   5. Eczema, unspecified type Stable. Following with derm. Using triamcinolone.   6. Hyperglycemia We discussed dietary changes today and review eating low carb diet. Reviewed my portion/plate control handout and discussed carb servings per meal today. Discussed portion control for weight loss. She has done a good job of starting weight loss.   7. Hyperlipidemia, unspecified hyperlipidemia type Has done well with crestor. Continue this at 5mg  ddaily dose.  - Comprehensive metabolic panel; Future - Lipid panel; Future - Lipid panel - Comprehensive metabolic panel  8. B12 deficiency Improved when last check. Continue with daily b12 supplement. Will recheck to ensure still within normal. - Vitamin B12; Future - Vitamin B12  9. Insomnia, unspecified type Trazodone 25mg  prn.   Return in about 6 months (around 11/12/2021) for physical exam.  49 minutes spent in exam, discussion diet, tips for healthy eating, charting.     Micheline Rough, MD

## 2021-05-14 ENCOUNTER — Other Ambulatory Visit (INDEPENDENT_AMBULATORY_CARE_PROVIDER_SITE_OTHER): Payer: Medicare HMO

## 2021-05-14 DIAGNOSIS — R739 Hyperglycemia, unspecified: Secondary | ICD-10-CM | POA: Diagnosis not present

## 2021-05-14 LAB — HEMOGLOBIN A1C: Hgb A1c MFr Bld: 6.1 % (ref 4.6–6.5)

## 2021-05-14 NOTE — Addendum Note (Signed)
Addended by: Lahoma Crocker A on: 05/14/2021 09:00 AM   Modules accepted: Orders

## 2021-05-28 ENCOUNTER — Other Ambulatory Visit: Payer: Self-pay

## 2021-05-28 ENCOUNTER — Other Ambulatory Visit (INDEPENDENT_AMBULATORY_CARE_PROVIDER_SITE_OTHER): Payer: Medicare HMO

## 2021-05-28 DIAGNOSIS — R739 Hyperglycemia, unspecified: Secondary | ICD-10-CM

## 2021-05-28 DIAGNOSIS — E875 Hyperkalemia: Secondary | ICD-10-CM

## 2021-05-28 DIAGNOSIS — J309 Allergic rhinitis, unspecified: Secondary | ICD-10-CM

## 2021-05-28 LAB — BASIC METABOLIC PANEL
BUN: 21 mg/dL (ref 6–23)
CO2: 30 mEq/L (ref 19–32)
Calcium: 9.8 mg/dL (ref 8.4–10.5)
Chloride: 100 mEq/L (ref 96–112)
Creatinine, Ser: 1.09 mg/dL (ref 0.40–1.20)
GFR: 49.15 mL/min — ABNORMAL LOW (ref >60.00)
Glucose, Bld: 88 mg/dL (ref 70–99)
Potassium: 4.2 mEq/L (ref 3.5–5.1)
Sodium: 136 mEq/L (ref 135–145)

## 2021-06-22 ENCOUNTER — Ambulatory Visit (INDEPENDENT_AMBULATORY_CARE_PROVIDER_SITE_OTHER): Payer: Medicare HMO | Admitting: Family Medicine

## 2021-06-22 ENCOUNTER — Other Ambulatory Visit: Payer: Self-pay

## 2021-06-22 VITALS — BP 140/80 | HR 70 | Temp 98.0°F | Wt 123.9 lb

## 2021-06-22 DIAGNOSIS — R42 Dizziness and giddiness: Secondary | ICD-10-CM | POA: Diagnosis not present

## 2021-06-22 DIAGNOSIS — R5383 Other fatigue: Secondary | ICD-10-CM | POA: Diagnosis not present

## 2021-06-22 NOTE — Patient Instructions (Signed)
Try leaving off the Levocetirizine  Stay well hydrated.

## 2021-06-22 NOTE — Progress Notes (Signed)
Established Patient Office Visit  Subjective:  Patient ID: Kelly Petersen, female    DOB: Feb 20, 1946  Age: 75 y.o. MRN: [id number]  CC:  Chief Complaint  Patient presents with   Dizziness    X 3 days, constant dizziness but worse when moving, very congested     HPI Kelly Petersen presents for dizziness.  She states that she had a tetanus shot last week but that was early in the week and then this past Sunday she noticed some lightheadedness.  Her symptoms have been somewhat inconsistent.  Possibly worse when moving but not truly orthostatic.  Denies any vertigo.  Denies any fevers or chills.  No nasal congestive symptoms.  No chest pains.  No palpitations.  No syncope.  No focal weakness.  No progressive headaches.  No speech changes.  She has history of prediabetes and had recent A1c which is stable.  She had multiple other recent labs including TSH, B12, comprehensive metabolic panel, CBC and these were all unremarkable.  No new medications.  She takes several medications including over-the-counter Xyzal.  No medications with high anticholinergic effects.  She complains of some nonspecific fatigue.  She states that she usually gets up 3-4 times at night to go the bathroom which is about her baseline.  Appetite and weight are stable.  She has lost some weight since last winter but states she has a nephew getting married in September and has been trying to lose some weight for that wedding.   Past Medical History:  Diagnosis Date   Adrenal mass ([place]) 01/02/2017   -monitored by endocrinologist in the past -reports told did not need further imaging   Asthma    Chicken pox    Depression    Eczema    Endometrial cancer ([place])    Environmental allergies    Glaucoma    Hyperglycemia    Hyperlipidemia    Insomnia    Osteopenia     Past Surgical History:  Procedure Laterality Date   ABDOMINAL HYSTERECTOMY  1999   endometrial cancer   BREAST SURGERY  2003   lumpectomy; benign    CATARACT EXTRACTION     right eye-April 2021--left eye June 2021   ECTOPIC PREGNANCY SURGERY      Family History  Problem Relation Age of Onset   Alzheimer's disease Mother 59   Asthma Mother    Lung cancer Father 66       worked at [employer], [place] Maternal Grandfather 80   Hyperlipidemia Paternal Grandmother 78   Lung cancer Paternal Grandfather 71    Social History   Socioeconomic History   Marital status: Single    Spouse name: Not on file   Number of children: Not on file   Years of education: Not on file   Highest education level: Not on file  Occupational History   Occupation: retired  Tobacco Use   Smoking status: Never   Smokeless tobacco: Never  Substance and Sexual Activity   Alcohol use: Yes    Comment: 2 drink rarely   Drug use: No   Sexual activity: Not on file  Other Topics Concern   Not on file  Social History Narrative   Work or School: cares for grandchild      Home Situation: lives alone      Spiritual Beliefs: Christian, no church currently      Lifestyle: no regular exercise; diet is poor      Social Determinants of Health  Financial Resource Strain: Low Risk    Difficulty of Paying Living Expenses: Not hard at all  Food Insecurity: No Food Insecurity   Worried About Charity fundraiser in the Last Year: Never true   Ran Out of Food in the Last Year: Never true  Transportation Needs: No Transportation Needs   Lack of Transportation (Medical): No   Lack of Transportation (Non-Medical): No  Physical Activity: Inactive   Days of Exercise per Week: 0 days   Minutes of Exercise per Session: 0 min  Stress: No Stress Concern Present   Feeling of Stress : Not at all  Social Connections: Socially Isolated   Frequency of Communication with Friends and Family: More than three times a week   Frequency of Social Gatherings with Friends and Family: More than three times a week   Attends Religious Services: Never   Corporate treasurer or Organizations: No   Attends Music therapist: Never   Marital Status: Never married  Human resources officer Violence: Not At Risk   Fear of Current or Ex-Partner: No   Emotionally Abused: No   Physically Abused: No   Sexually Abused: No    Outpatient Medications Prior to Visit  Medication Sig Dispense Refill   ASMANEX 120 METERED DOSES 220 MCG/INH inhaler USE 2 INHALATIONS TWICE A  DAY 3 Inhaler 0   Brimonidine Tartrate-Timolol (COMBIGAN OP) Apply 1 drop to eye 2 (two) times daily.     CALCIUM PO Take 600 mg by mouth 2 (two) times daily.     Cyanocobalamin (VITAMIN B 12 PO) Take by mouth.     denosumab (PROLIA) 60 MG/ML SOSY injection Inject 60 mg into the skin every 6 (six) months.     levocetirizine (XYZAL) 5 MG tablet      montelukast (SINGULAIR) 10 MG tablet TAKE 1 TABLET BY MOUTH  EVERY NIGHT AT BEDTIME 90 tablet 1   Pseudoephedrine-Ibuprofen (ADVIL COLD/SINUS PO) Take by mouth as needed.     rosuvastatin (CRESTOR) 5 MG tablet TAKE 1 TABLET BY MOUTH  DAILY 90 tablet 3   sertraline (ZOLOFT) 25 MG tablet TAKE 1 TABLET BY MOUTH  DAILY (Patient taking differently: Take 12.5 mg by mouth daily.) 90 tablet 1   SYNTHROID 25 MCG tablet TAKE 1 TABLET(25 MCG) BY MOUTH DAILY BEFORE BREAKFAST 90 tablet 1   traZODone (DESYREL) 50 MG tablet Take 0.5-1 tablets (25-50 mg total) by mouth at bedtime as needed. 90 tablet 3   VITAMIN D PO Take 500 Units by mouth daily.     No facility-administered medications prior to visit.    Allergies  Allergen Reactions   Other Itching    Azogantrazine - Sulfa drug for UTI    ROS Review of Systems  Constitutional:  Negative for appetite change, chills, fever and unexpected weight change.  Eyes:  Negative for visual disturbance.  Respiratory:  Negative for shortness of breath.   Cardiovascular:  Negative for chest pain.  Gastrointestinal:  Negative for abdominal pain.  Genitourinary:  Negative for dysuria.  Neurological:  Positive for  dizziness. Negative for seizures, syncope, speech difficulty, weakness and headaches.  Hematological:  Negative for adenopathy.  Psychiatric/Behavioral:  Negative for confusion.      Objective:    Physical Exam Vitals reviewed.  Constitutional:      Appearance: Normal appearance.  HENT:     Right Ear: Tympanic membrane normal.     Left Ear: Tympanic membrane normal.  Eyes:     Extraocular Movements:  Extraocular movements intact.     Pupils: Pupils are equal, round, and reactive to light.  Cardiovascular:     Rate and Rhythm: Normal rate and regular rhythm.  Pulmonary:     Effort: Pulmonary effort is normal.     Breath sounds: Normal breath sounds.  Musculoskeletal:     Cervical back: Neck supple.     Right lower leg: No edema.     Left lower leg: No edema.  Neurological:     General: No focal deficit present.     Mental Status: She is alert and oriented to person, place, and time. Mental status is at baseline.     Cranial Nerves: No cranial nerve deficit.     Motor: No weakness.     Coordination: Coordination normal.     Gait: Gait normal.     Comments: Does seem to have very subtle tremor of her head and neck but she was not aware of this.  We cannot appreciate any tremor involving her upper extremities.    BP 140/80 (BP Location: Left Arm, Patient Position: Sitting, Cuff Size: Normal)   Pulse 70   Temp 98 F (36.7 C) (Oral)   Wt 123 lb 14.4 oz (56.2 kg)   SpO2 97%   BMI 23.41 kg/m  Wt Readings from Last 3 Encounters:  06/22/21 123 lb 14.4 oz (56.2 kg)  05/12/21 123 lb 12.8 oz (56.2 kg)  11/02/20 135 lb 3.2 oz (61.3 kg)     Health Maintenance Due  Topic Date Due   TETANUS/TDAP  11/24/2020   COVID-19 Vaccine (4 - Booster for Moderna series) 12/24/2020   INFLUENZA VACCINE  06/07/2021    There are no preventive care reminders to display for this patient.  Lab Results  Component Value Date   TSH 5.00 05/12/2021   Lab Results  Component Value Date   WBC  8.9 05/12/2021   HGB 13.8 05/12/2021   HCT 41.0 05/12/2021   MCV 94.9 05/12/2021   PLT 264.0 05/12/2021   Lab Results  Component Value Date   NA 136 05/28/2021   K 4.2 05/28/2021   CO2 30 05/28/2021   GLUCOSE 88 05/28/2021   BUN 21 05/28/2021   CREATININE 1.09 05/28/2021   BILITOT 0.4 05/12/2021   ALKPHOS 57 05/12/2021   AST 24 05/12/2021   ALT 20 05/12/2021   PROT 7.2 05/12/2021   ALBUMIN 4.3 05/12/2021   CALCIUM 9.8 05/28/2021   GFR 49.15 (L) 05/28/2021   Lab Results  Component Value Date   CHOL 185 05/12/2021   Lab Results  Component Value Date   HDL 54.60 05/12/2021   Lab Results  Component Value Date   LDLCALC 77 07/24/2019   Lab Results  Component Value Date   TRIG 211.0 (H) 05/12/2021   Lab Results  Component Value Date   CHOLHDL 3 05/12/2021   Lab Results  Component Value Date   HGBA1C 6.1 05/14/2021      Assessment & Plan:   Patient presents with nonspecific lightheadedness over the past couple days.  Etiology not clear.  Nonfocal neuro exam other than some subtle tremor head and neck which she was not aware of.  No upper extremity tremor.  No orthostatic change.  Blood pressure seated 140/80 and standing 142/80.  Heart exam unremarkable.  Multiple recent labs unremarkable.  -Recommend she leave off [place] on staying well-hydrated -Watch for any new symptoms such as focal weakness, upper extremity tremor, speech change, etc. -Follow-up for persistent or worsening  symptoms -30 minutes spent evaluating her symptoms, reviewing her medical history, performing exam, and discussing things to watch for  No orders of the defined types were placed in this encounter.   Follow-up: No follow-ups on file.    Carolann Littler, MD

## 2021-06-28 ENCOUNTER — Other Ambulatory Visit: Payer: Self-pay

## 2021-06-28 ENCOUNTER — Ambulatory Visit (INDEPENDENT_AMBULATORY_CARE_PROVIDER_SITE_OTHER): Payer: Medicare HMO | Admitting: Family Medicine

## 2021-06-28 ENCOUNTER — Encounter: Payer: Self-pay | Admitting: Family Medicine

## 2021-06-28 VITALS — BP 112/68 | HR 71 | Temp 97.8°F | Ht 61.0 in | Wt 123.7 lb

## 2021-06-28 DIAGNOSIS — R42 Dizziness and giddiness: Secondary | ICD-10-CM | POA: Diagnosis not present

## 2021-06-28 DIAGNOSIS — E039 Hypothyroidism, unspecified: Secondary | ICD-10-CM | POA: Diagnosis not present

## 2021-06-28 DIAGNOSIS — E875 Hyperkalemia: Secondary | ICD-10-CM | POA: Diagnosis not present

## 2021-06-28 LAB — CBC WITH DIFFERENTIAL/PLATELET
Basophils Absolute: 0.1 10*3/uL (ref 0.0–0.1)
Basophils Relative: 1.2 % (ref 0.0–3.0)
Eosinophils Absolute: 0.7 10*3/uL (ref 0.0–0.7)
Eosinophils Relative: 7.3 % — ABNORMAL HIGH (ref 0.0–5.0)
HCT: 42.2 % (ref 36.0–46.0)
Hemoglobin: 13.8 g/dL (ref 12.0–15.0)
Lymphocytes Relative: 27.9 % (ref 12.0–46.0)
Lymphs Abs: 2.5 10*3/uL (ref 0.7–4.0)
MCHC: 32.8 g/dL (ref 30.0–36.0)
MCV: 96 fl (ref 78.0–100.0)
Monocytes Absolute: 0.6 10*3/uL (ref 0.1–1.0)
Monocytes Relative: 7 % (ref 3.0–12.0)
Neutro Abs: 5.1 10*3/uL (ref 1.4–7.7)
Neutrophils Relative %: 56.6 % (ref 43.0–77.0)
Platelets: 252 10*3/uL (ref 150.0–400.0)
RBC: 4.4 Mil/uL (ref 3.87–5.11)
RDW: 13.1 % (ref 11.5–15.5)
WBC: 9 10*3/uL (ref 4.0–10.5)

## 2021-06-28 LAB — COMPREHENSIVE METABOLIC PANEL
ALT: 21 U/L (ref 0–35)
AST: 25 U/L (ref 0–37)
Albumin: 4.3 g/dL (ref 3.5–5.2)
Alkaline Phosphatase: 59 U/L (ref 39–117)
BUN: 18 mg/dL (ref 6–23)
CO2: 29 mEq/L (ref 19–32)
Calcium: 10.3 mg/dL (ref 8.4–10.5)
Chloride: 99 mEq/L (ref 96–112)
Creatinine, Ser: 0.99 mg/dL (ref 0.40–1.20)
GFR: 55.13 mL/min — ABNORMAL LOW (ref >60.00)
Glucose, Bld: 67 mg/dL — ABNORMAL LOW (ref 70–99)
Potassium: 4.4 mEq/L (ref 3.5–5.1)
Sodium: 137 mEq/L (ref 135–145)
Total Bilirubin: 0.4 mg/dL (ref 0.2–1.2)
Total Protein: 7.5 g/dL (ref 6.0–8.3)

## 2021-06-28 LAB — TSH: TSH: 6.4 u[IU]/mL — ABNORMAL HIGH (ref 0.35–5.50)

## 2021-06-28 NOTE — Progress Notes (Signed)
Kelly Petersen DOB: [birth year] Encounter date: 06/28/2021  This is a 75 y.o. female who presents with Chief Complaint  Patient presents with   Dizziness    Recurrent issues with balance x9 days    History of present illness:  She brought her bp cuff with her today. Was surprised at high pressure when she was here with Dr. Elease Hashimoto. Feels tired. Feels like she could go back to bed after breakfast. Has been checking pressures regularly at home for last couple of days.   Had one high reading - day she put dog down - [id number] systolic at that time (didn't write it down); otherwise has been 94/61, 118/75, 130/73, 93/59, 122/72, 1237/74 and HR 66-78.   When she first stands up she feels light headed (not spinning) and feels like she isn't walking straight. Started fairly suddenly after waking up a week and a half ago. Hurts across back of head; lower head. Feels normal when sitting; just with standing that she is symptomatic.   Ears have been itching and getting a little clogged, but tends to have harder allergy time in September. No fevers. No cough.   Sx not worse but not better. Just continues to feel off balance even once up.   No other muscle weakness, aches, pains.   No vision changes.   Was taking keto pills but has stopped.    Allergies  Allergen Reactions   Other Itching    Azogantrazine - Sulfa drug for UTI   Current Meds  Medication Sig   ASMANEX 120 METERED DOSES 220 MCG/INH inhaler USE 2 INHALATIONS TWICE A  DAY   Brimonidine Tartrate-Timolol (COMBIGAN OP) Apply 1 drop to eye 2 (two) times daily.   CALCIUM PO Take 600 mg by mouth 2 (two) times daily.   Cyanocobalamin (VITAMIN B 12 PO) Take by mouth.   denosumab (PROLIA) 60 MG/ML SOSY injection Inject 60 mg into the skin every 6 (six) months.   levocetirizine (XYZAL) 5 MG tablet    montelukast (SINGULAIR) 10 MG tablet TAKE 1 TABLET BY MOUTH  EVERY NIGHT AT BEDTIME   Pseudoephedrine-Ibuprofen (ADVIL COLD/SINUS PO) Take by  mouth as needed.   rosuvastatin (CRESTOR) 5 MG tablet TAKE 1 TABLET BY MOUTH  DAILY   sertraline (ZOLOFT) 25 MG tablet TAKE 1 TABLET BY MOUTH  DAILY (Patient taking differently: Take 12.5 mg by mouth daily.)   SYNTHROID 25 MCG tablet TAKE 1 TABLET(25 MCG) BY MOUTH DAILY BEFORE BREAKFAST   traZODone (DESYREL) 50 MG tablet Take 0.5-1 tablets (25-50 mg total) by mouth at bedtime as needed.   VITAMIN D PO Take 500 Units by mouth daily.    Review of Systems  Constitutional:  Negative for chills, fatigue and fever.  Respiratory:  Negative for cough, chest tightness, shortness of breath and wheezing.   Cardiovascular:  Negative for chest pain, palpitations and leg swelling.  Neurological:  Positive for dizziness.   Objective:  BP 112/68 (BP Location: Left Arm, Patient Position: Sitting, Cuff Size: Normal)   Pulse 71   Temp 97.8 F (36.6 C) (Oral)   Ht '5\' 1"'$  (1.549 m)   Wt 123 lb 11.2 oz (56.1 kg)   SpO2 98%   BMI 23.37 kg/m   Weight: 123 lb 11.2 oz (56.1 kg)   BP Readings from Last 3 Encounters:  06/28/21 112/68  06/22/21 140/80  05/12/21 (!) 100/58   Wt Readings from Last 3 Encounters:  06/28/21 123 lb 11.2 oz (56.1 kg)  06/22/21 123 lb 14.4 oz (  56.2 kg)  05/12/21 123 lb 12.8 oz (56.2 kg)    Physical Exam Constitutional:      General: She is not in acute distress.    Appearance: She is well-developed. She is not diaphoretic.  HENT:     Head: Normocephalic and atraumatic.     Right Ear: Tympanic membrane, ear canal and external ear normal.     Left Ear: Tympanic membrane, ear canal and external ear normal.  Eyes:     Conjunctiva/sclera: Conjunctivae normal.     Pupils: Pupils are equal, round, and reactive to light.  Neck:     Thyroid: No thyromegaly.  Cardiovascular:     Rate and Rhythm: Normal rate and regular rhythm.     Heart sounds: Normal heart sounds. No murmur heard.   No friction rub. No gallop.     Comments: No carotid bruit appreciated Pulmonary:      Effort: Pulmonary effort is normal. No respiratory distress.     Breath sounds: Normal breath sounds. No wheezing or rales.  Musculoskeletal:     Cervical back: Neck supple.     Right lower leg: No edema.     Left lower leg: No edema.  Lymphadenopathy:     Cervical: No cervical adenopathy.  Skin:    General: Skin is warm and dry.  Neurological:     Mental Status: She is alert and oriented to person, place, and time.     Cranial Nerves: No cranial nerve deficit.     Motor: No weakness, abnormal muscle tone or pronator drift. Tremors: mild head.    Coordination: Romberg sign positive. Coordination normal. Heel to [facility] Test abnormal (difficulty to balance on one side).     Gait: Tandem walk abnormal (falling to left).     Deep Tendon Reflexes: Reflexes normal.     Reflex Scores:      Tricep reflexes are 2+ on the right side and 2+ on the left side.      Bicep reflexes are 2+ on the right side and 2+ on the left side.      Brachioradialis reflexes are 2+ on the right side and 2+ on the left side.      Patellar reflexes are 2+ on the right side and 2+ on the left side. Psychiatric:        Behavior: Behavior normal.    Assessment/Plan 1. Light headed Symptoms reproducible upon standing. Not orthostatic. No reproduction of symptoms with position changes while sitting/lying. Concern for more cental (cerebellar) cause and do feel additional imaging warranted pending bloodwork results.  - CBC with Differential/Platelet; Future - TSH; Future - TSH - CBC with Differential/Platelet  2. Hyperkalemia Recheck labs today.  - Comprehensive metabolic panel; Future - Comprehensive metabolic panel  3. Hypothyroidism, unspecified type - TSH; Future   Return for pending bloodwork. 31 minute spent in exam, chart review, discussion of planned evaluation, charting.      Micheline Rough, MD

## 2021-06-30 ENCOUNTER — Other Ambulatory Visit: Payer: Self-pay | Admitting: Family Medicine

## 2021-06-30 ENCOUNTER — Telehealth: Payer: Self-pay

## 2021-06-30 DIAGNOSIS — R42 Dizziness and giddiness: Secondary | ICD-10-CM

## 2021-06-30 NOTE — Telephone Encounter (Signed)
Noted  

## 2021-06-30 NOTE — Telephone Encounter (Signed)
Patient returned call and was informed of results patient verbalized understanding and scheduled lab appt on 08/11/21

## 2021-07-29 ENCOUNTER — Telehealth: Payer: Self-pay | Admitting: Family Medicine

## 2021-07-29 NOTE — Telephone Encounter (Signed)
[facility] called to advise that the MRI schedule tomorrow needs a authorization for the referral in order for her to be seen. However that auth would need be sent in by 12 for her to be seen tomorrow so they will reschedule but they still need a auth so they can reschedule. They can be reached at [phone] ext [phone]

## 2021-07-30 ENCOUNTER — Other Ambulatory Visit: Payer: Medicare HMO

## 2021-08-06 ENCOUNTER — Telehealth: Payer: Self-pay

## 2021-08-06 MED ORDER — SYNTHROID 25 MCG PO TABS: ORAL_TABLET | ORAL | 0 refills | Status: DC

## 2021-08-06 NOTE — Telephone Encounter (Signed)
Awaiting authorization through eviore  pending - informed pt of the status 08/06/2021 called pt  The below referenced case number is now pending [employer] review. Patient Name: Kelly Petersen [facility] Health Plan Name: [employer] Member ID: [id number] Case Number: [id number]

## 2021-08-06 NOTE — Addendum Note (Signed)
Addended by: Agnes Lawrence on: 08/06/2021 11:22 AM   Modules accepted: Orders

## 2021-08-06 NOTE — Telephone Encounter (Signed)
Awaiting authorization through [employer]iore  pending - informed pt of the status 08/06/2021 called pt  The below referenced case number is now pending [employer] review. Patient Name: Kelly Petersen [facility] Health Plan Name: [employer] Member ID: [id number] Case Number: [id number]

## 2021-08-06 NOTE — Telephone Encounter (Signed)
Patient called requesting Rx refill SYNTHROID 25 MCG tablet And also to check the status of Referral

## 2021-08-06 NOTE — Telephone Encounter (Signed)
Rx done.  Spoke with Neoma Laming, referral coordinator and she stated she spoke with the patient and is processing the referral for the MRI.

## 2021-08-11 ENCOUNTER — Other Ambulatory Visit: Payer: Self-pay

## 2021-08-11 ENCOUNTER — Other Ambulatory Visit (INDEPENDENT_AMBULATORY_CARE_PROVIDER_SITE_OTHER): Payer: Medicare HMO

## 2021-08-11 DIAGNOSIS — E039 Hypothyroidism, unspecified: Secondary | ICD-10-CM | POA: Diagnosis not present

## 2021-08-11 LAB — TSH: TSH: 3.93 u[IU]/mL (ref 0.35–5.50)

## 2021-08-18 ENCOUNTER — Telehealth: Payer: Self-pay | Admitting: Family Medicine

## 2021-08-18 NOTE — Telephone Encounter (Signed)
Is there a different call back number for peer to peer - I tried that number but it is for checking on service coverage, certification, insurance info, etc. I could not get to main menu for peer to peer even after entering in all my information and the patient's.

## 2021-08-18 NOTE — Telephone Encounter (Signed)
Per Neoma Laming, referral coordinator, a peer review by the provider is needed and the MRI was denied as below:  Based on Medicare National Coverage Determinations (NCD): 220.2 Magnetic Resonance Imaging and [employer] Head Imaging Guidelines Section(s): General Guidelines (HD 1.0), we cannot approve this request. Your healthcare provider told us that there is a concern related to a region of your head. The request cannot be approved because: We did not receive a detailed history that shows this study is needed. You should share a copy of this decision with your doctor so you and your doctor can discuss next steps. If your doctor requested coverage on your behalf, we have sent a copy of this decision to your doctor. Re: Notice of Denial of Medical Coverage for case [id number]  Message sent to PCP.

## 2021-08-18 NOTE — Telephone Encounter (Signed)
Patient stated that imaging advised her to call Dr.Koberlein to see why her MRI was denied.   Patient could be contacted at [phone].  Please advise.

## 2021-08-18 NOTE — Telephone Encounter (Signed)
*  joanne - can you please see if patient is still having symptoms of dizziness? This is what MRI was originally ordered for.   *deborah - this is the first I am hearing about denial and need for peer review. Did I miss something earlier? Was my last office note sent along with this order? And am I still able to do a peer review since this order was put in nearly two months ago? Thanks!

## 2021-08-18 NOTE — Telephone Encounter (Signed)
Spoke with the patient and she complains of recurrent dizziness especially noted in the morning.  Message sent to PCP.

## 2021-09-03 ENCOUNTER — Telehealth: Payer: Self-pay | Admitting: Family Medicine

## 2021-09-03 NOTE — Telephone Encounter (Signed)
JoAnne - please let patient know that I have filed and faxed an appeal to get approval for her MRI. We should hear in the next couple of weeks.    Deb [phone]   Provider note: I googled a different number for [employer] and was able to get a representative to transfer me for a peer to peer. Was then told by [employer] rep that with this insurance plan, peer to peers are not offered, so no peer to peer was available and the only way to get approval for requested procedure was to file for an expedited appeal. According to [place] [facility] records, there were 2 authorized orders submitted for the MRI. One on 8/24 (which has expired but was listed as "pending review" because they stated they didn't receive any clinical or medical records along with this) and one from 9/30 which was denied because the uploaded document was on a different patient. The uploaded document has since been removed from their system. A denial for the 9/30 submission was issued on 08/17/21. I inquired about just sending over the medical records rather than appeal, but this is not an option. Only next level review option is appeal and expedited appeal will avoid a 45 day wait period to re-order study. I have generated an appeal header letter and faxed over my last note and labwork to them at [phone] appeal # [phone].

## 2021-09-03 NOTE — Telephone Encounter (Addendum)
Shanel with [place] appeal dept is calling to let md know they should have answer in 72 hrs from the receipt of appeal

## 2021-09-03 NOTE — Telephone Encounter (Signed)
Left a message for the pt to return my call.  

## 2021-09-08 NOTE — Telephone Encounter (Signed)
Fax received from [place] stating the appeal was approved.  I faxed this to [facility]-attn: authorization dept at [phone] and the patient is aware someone will contact her with appointment information.  Fax sent to be scanned.

## 2021-10-04 ENCOUNTER — Other Ambulatory Visit: Payer: Self-pay

## 2021-10-04 ENCOUNTER — Ambulatory Visit
Admission: RE | Admit: 2021-10-04 | Discharge: 2021-10-04 | Disposition: A | Discharge status: Home / Self Care | Payer: Medicare HMO | Source: Ambulatory Visit | Attending: Family Medicine | Admitting: Family Medicine

## 2021-10-04 DIAGNOSIS — R42 Dizziness and giddiness: Secondary | ICD-10-CM

## 2021-10-12 ENCOUNTER — Ambulatory Visit (INDEPENDENT_AMBULATORY_CARE_PROVIDER_SITE_OTHER): Payer: Medicare HMO

## 2021-10-12 ENCOUNTER — Telehealth: Payer: Self-pay | Admitting: Family Medicine

## 2021-10-12 ENCOUNTER — Other Ambulatory Visit: Payer: Self-pay | Admitting: Family Medicine

## 2021-10-12 VITALS — BP 102/60 | HR 74 | Temp 97.5°F | Ht 61.0 in | Wt 130.0 lb

## 2021-10-12 DIAGNOSIS — Z0000 Encounter for general adult medical examination without abnormal findings: Secondary | ICD-10-CM

## 2021-10-12 DIAGNOSIS — I639 Cerebral infarction, unspecified: Secondary | ICD-10-CM

## 2021-10-12 NOTE — Telephone Encounter (Signed)
Patient is requesting a call back with a question about her MRI results.    Call on Home [phone]

## 2021-10-12 NOTE — Progress Notes (Signed)
This visit occurred during the SARS-CoV-2 public health emergency.  Safety protocols were in place, including screening questions prior to the visit, additional usage of staff PPE, and extensive cleaning of exam room while observing appropriate contact time as indicated for disinfecting solutions.  Subjective:   Taliah Porche is a 75 y.o. female who presents for Medicare Annual (Subsequent) preventive examination.  Review of Systems     Cardiac Risk Factors include: advanced age (>70men, >22 women);dyslipidemia     Objective:    Today's Vitals   10/12/21 1125  BP: 102/60  Pulse: 74  Temp: (!) 97.5 F (36.4 C)  TempSrc: Oral  SpO2: 95%  Weight: 130 lb (59 kg)  Height: 5\' 1"  (1.549 m)   Body mass index is 24.56 kg/m.  Advanced Directives 10/12/2021 09/29/2020 03/20/2018 12/22/2016  Does Patient Have a Medical Advance Directive? Yes Yes Yes Yes  Type of Paramedic of [place];Living will [place];Living will - -  Copy of [place] in Chart? No - copy requested No - copy requested - -    Current Medications (verified) Outpatient Encounter Medications as of 10/12/2021  Medication Sig   ASMANEX 120 METERED DOSES 220 MCG/INH inhaler USE 2 INHALATIONS TWICE A  DAY   Brimonidine Tartrate-Timolol (COMBIGAN OP) Apply 1 drop to eye 2 (two) times daily.   CALCIUM PO Take 600 mg by mouth 2 (two) times daily.   Cyanocobalamin (VITAMIN B 12 PO) Take by mouth.   denosumab (PROLIA) 60 MG/ML SOSY injection Inject 60 mg into the skin every 6 (six) months.   levocetirizine (XYZAL) 5 MG tablet    montelukast (SINGULAIR) 10 MG tablet TAKE 1 TABLET BY MOUTH  EVERY NIGHT AT BEDTIME   Pseudoephedrine-Ibuprofen (ADVIL COLD/SINUS PO) Take by mouth as needed.   rosuvastatin (CRESTOR) 5 MG tablet TAKE 1 TABLET BY MOUTH  DAILY   sertraline (ZOLOFT) 25 MG tablet TAKE 1 TABLET BY MOUTH  DAILY (Patient taking differently: Take 12.5 mg by mouth  daily.)   SYNTHROID 25 MCG tablet TAKE 1 TABLET(25 MCG) BY MOUTH DAILY BEFORE BREAKFAST (Patient taking differently: TAKE 2 TABLET(25 MCG) BY MOUTH EVERY OTHER NIGHT BEFORE BREAKFAST)   traZODone (DESYREL) 50 MG tablet Take 0.5-1 tablets (25-50 mg total) by mouth at bedtime as needed.   VITAMIN D PO Take 500 Units by mouth daily.   No facility-administered encounter medications on file as of 10/12/2021.    Allergies (verified) Other   History: Past Medical History:  Diagnosis Date   Adrenal mass ([place]) 01/02/2017   -monitored by endocrinologist in the past -reports told did not need further imaging   Asthma    Chicken pox    Depression    Eczema    Endometrial cancer ([place])    Environmental allergies    Glaucoma    Hyperglycemia    Hyperlipidemia    Insomnia    Osteopenia    Past Surgical History:  Procedure Laterality Date   ABDOMINAL HYSTERECTOMY  1999   endometrial cancer   BREAST SURGERY  2003   lumpectomy; benign   CATARACT EXTRACTION     right eye-April 2021--left eye June 2021   ECTOPIC PREGNANCY SURGERY     Family History  Problem Relation Age of Onset   Alzheimer's disease Mother 65   Asthma Mother    Lung cancer Father 51       worked at [employer], [place] Maternal Grandfather 80   Hyperlipidemia  Paternal Grandmother 38   Lung cancer Paternal Grandfather 80   Social History   Socioeconomic History   Marital status: Single    Spouse name: Not on file   Number of children: Not on file   Years of education: Not on file   Highest education level: Not on file  Occupational History   Occupation: retired  Tobacco Use   Smoking status: Never   Smokeless tobacco: Never  Vaping Use   Vaping Use: Never used  Substance and Sexual Activity   Alcohol use: Yes    Comment: 2 drink rarely   Drug use: No   Sexual activity: Not on file  Other Topics Concern   Not on file  Social History Narrative   Work or School: cares for grandchild      Home  Situation: lives alone      Spiritual Beliefs: Christian, no church currently      Lifestyle: no regular exercise; diet is poor      Social Determinants of Radio broadcast assistant Strain: Low Risk    Difficulty of Paying Living Expenses: Not hard at all  Food Insecurity: No Food Insecurity   Worried About Charity fundraiser in the Last Year: Never true   Arboriculturist in the Last Year: Never true  Transportation Needs: No Transportation Needs   Lack of Transportation (Medical): No   Lack of Transportation (Non-Medical): No  Physical Activity: Inactive   Days of Exercise per Week: 0 days   Minutes of Exercise per Session: 0 min  Stress: No Stress Concern Present   Feeling of Stress : Not at all  Social Connections: Not on file    Tobacco Counseling Counseling given: Not Answered   Clinical Intake:  Pre-visit preparation completed: Yes  Pain : No/denies pain     Nutritional Status: BMI of 19-24  Normal Nutritional Risks: None Diabetes: No  How often do you need to have someone help you when you read instructions, pamphlets, or other written materials from your doctor or pharmacy?: 1 - Never What is the last grade level you completed in school?: some graduate  Diabetic? no  Interpreter Needed?: No  Information entered by :: NAllen LPN   Activities of Daily Living In your present state of health, do you have any difficulty performing the following activities: 10/12/2021  Hearing? Y  Vision? N  Difficulty concentrating or making decisions? N  Walking or climbing stairs? N  Dressing or bathing? N  Doing errands, shopping? N  Preparing Food and eating ? N  Using the Toilet? N  In the past six months, have you accidently leaked urine? Y  Do you have problems with loss of bowel control? N  Managing your Medications? N  Managing your Finances? N  Housekeeping or managing your Housekeeping? N  Some recent data might be hidden    Patient Care  Team: Caren Macadam, MD as PCP - General (Family Medicine) Sanjuana Kava, MD as Referring Physician (Obstetrics and Gynecology) Hillery Jacks, MD as Referring Physician (Dermatology)  Indicate any recent Medical Services you may have received from other than [facility] providers in the past year (date may be approximate).     Assessment:   This is a routine wellness examination for Christy.  Hearing/Vision screen Vision Screening - Comments:: Regular eye exams, Dr. Prudencio Burly  Dietary issues and exercise activities discussed: Current Exercise Habits: The patient does not participate in regular exercise at present   Goals  Addressed             This Visit's Progress    Patient Stated       10/12/2021, stay alive       Depression Screen PHQ 2/9 Scores 10/12/2021 05/12/2021 11/02/2020 09/29/2020 10/23/2019 07/31/2019 03/20/2018  PHQ - 2 Score 0 0 0 0 0 0 0  PHQ- 9 Score - 0 2 - 6 0 -    Fall Risk Fall Risk  10/12/2021 10/11/2021 09/29/2020 03/20/2018 12/22/2016  Falls in the past year? 0 0 0 No (No Data)  Comment - - - - slipped x 1;   Number falls in past yr: - - 0 - -  Injury with Fall? - - 0 - -  Risk for fall due to : Medication side effect - Impaired balance/gait;Impaired mobility - -  Follow up Falls evaluation completed;Education provided;Falls prevention discussed - Falls prevention discussed - -    FALL RISK PREVENTION PERTAINING TO THE HOME:  Any stairs in or around the home? Yes  If so, are there any without handrails? No  Home free of loose throw rugs in walkways, pet beds, electrical cords, etc? Yes  Adequate lighting in your home to reduce risk of falls? Yes   ASSISTIVE DEVICES UTILIZED TO PREVENT FALLS:  Life alert? No  Use of a cane, walker or w/c? No  Grab bars in the bathroom? No  Shower chair or bench in shower? No  Elevated toilet seat or a handicapped toilet? Yes   TIMED UP AND GO:  Was the test performed? No .    Gait steady and fast without use of  assistive device  Cognitive Function:     6CIT Screen 10/12/2021 09/29/2020 03/20/2018  What Year? 0 points 0 points 0 points  What month? 0 points 0 points 0 points  What time? 0 points - 0 points  Count back from 20 0 points 0 points 0 points  Months in reverse 0 points 0 points 0 points  Repeat phrase 0 points 0 points 0 points  Total Score 0 - 0    Immunizations Immunization History  Administered Date(s) Administered   Influenza Split 06/27/2013   Influenza, High Dose Seasonal PF 08/08/2014, 08/03/2015, 07/18/2016, 08/21/2017, 07/05/2019, 08/15/2021   Influenza-Unspecified 08/15/2018, 06/25/2019, 08/07/2020   Moderna Covid-19 Vaccine Bivalent Booster 26yrs & up 08/30/2021   Moderna Sars-Covid-2 Vaccination 12/09/2019, 01/07/2020, 09/23/2020   Pneumococcal Conjugate-13 08/08/2014   Pneumococcal Polysaccharide-23 05/15/2003, 11/07/2008, 12/09/2015   Td 05/11/2003   Tdap 11/24/2010, 06/14/2021   Zoster Recombinat (Shingrix) 06/22/2020, 09/01/2020   Zoster, Live 11/15/2011    TDAP status: Up to date  Flu Vaccine status: Up to date  Pneumococcal vaccine status: Up to date  Covid-19 vaccine status: Completed vaccines  Qualifies for Shingles Vaccine? Yes   Zostavax completed Yes   Shingrix Completed?: Yes  Screening Tests Health Maintenance  Topic Date Due   DEXA SCAN  03/20/2022   TETANUS/TDAP  06/15/2031   Pneumonia Vaccine 1+ Years old  Completed   INFLUENZA VACCINE  Completed   COVID-19 Vaccine  Completed   Hepatitis C Screening  Completed   Zoster Vaccines- Shingrix  Completed   HPV VACCINES  Aged Out   COLONOSCOPY (Pts 45-20yrs Insurance coverage will need to be confirmed)  Discontinued    Health Maintenance  There are no preventive care reminders to display for this patient.  Colorectal cancer screening: Type of screening: Colonoscopy. Completed 12/18/2013. Repeat every 10 years  Mammogram status: due  Bone  Density status: Completed 03/20/2020.  Results reflect: Bone density results: OSTEOPENIA. Repeat every 2 years.  Lung Cancer Screening: (Low Dose CT Chest recommended if Age 16-80 years, 30 pack-year currently smoking OR have quit w/in 15years.) does not qualify.   Lung Cancer Screening Referral: no  Additional Screening:  Hepatitis C Screening: does qualify; Completed 12/14/2015  Vision Screening: Recommended annual ophthalmology exams for early detection of glaucoma and other disorders of the eye. Is the patient up to date with their annual eye exam?  Yes  Who is the provider or what is the name of the office in which the patient attends annual eye exams? Dr. Prudencio Burly If pt is not established with a provider, would they like to be referred to a provider to establish care? No .   Dental Screening: Recommended annual dental exams for proper oral hygiene  Community Resource Referral / Chronic Care Management: CRR required this visit?  No   CCM required this visit?  No      Plan:     I have personally reviewed and noted the following in the patient's chart:   Medical and social history Use of alcohol, tobacco or illicit drugs  Current medications and supplements including opioid prescriptions.  Functional ability and status Nutritional status Physical activity Advanced directives List of other physicians Hospitalizations, surgeries, and ER visits in previous 12 months Vitals Screenings to include cognitive, depression, and falls Referrals and appointments  In addition, I have reviewed and discussed with patient certain preventive protocols, quality metrics, and best practice recommendations. A written personalized care plan for preventive services as well as general preventive health recommendations were provided to patient.     Kellie Simmering, LPN   12/14/2534   Nurse Notes: none

## 2021-10-12 NOTE — Patient Instructions (Signed)
Kelly Petersen , Thank you for taking time to come for your Medicare Wellness Visit. I appreciate your ongoing commitment to your health goals. Please review the following plan we discussed and let me know if I can assist you in the future.   Screening recommendations/referrals: Colonoscopy: completed 12/18/2013 Mammogram: due per patient Bone Density: completed 03/20/2020 Recommended yearly ophthalmology/optometry visit for glaucoma screening and checkup Recommended yearly dental visit for hygiene and checkup  Vaccinations: Influenza vaccine: completed 08/15/2021 Pneumococcal vaccine: completed 12/09/2015 Tdap vaccine: completed 06/14/2021 Shingles vaccine: completed   Covid-19: 08/30/2021, 09/23/2020, 01/07/2020, 12/09/2019  Advanced directives: Please bring a copy of your POA (Power of Attorney) and/or Living Will to your next appointment.   Conditions/risks identified: none  Next appointment: Follow up in one year for your annual wellness visit    Preventive Care 65 Years and Older, Female Preventive care refers to lifestyle choices and visits with your health care provider that can promote health and wellness. What does preventive care include? A yearly physical exam. This is also called an annual well check. Dental exams once or twice a year. Routine eye exams. Ask your health care provider how often you should have your eyes checked. Personal lifestyle choices, including: Daily care of your teeth and gums. Regular physical activity. Eating a healthy diet. Avoiding tobacco and drug use. Limiting alcohol use. Practicing safe sex. Taking low-dose aspirin every day. Taking vitamin and mineral supplements as recommended by your health care provider. What happens during an annual well check? The services and screenings done by your health care provider during your annual well check will depend on your age, overall health, lifestyle risk factors, and family history of disease. Counseling   Your health care provider may ask you questions about your: Alcohol use. Tobacco use. Drug use. Emotional well-being. Home and relationship well-being. Sexual activity. Eating habits. History of falls. Memory and ability to understand (cognition). Work and work Statistician. Reproductive health. Screening  You may have the following tests or measurements: Height, weight, and BMI. Blood pressure. Lipid and cholesterol levels. These may be checked every 5 years, or more frequently if you are over 66 years old. Skin check. Lung cancer screening. You may have this screening every year starting at age 76 if you have a 30-pack-year history of smoking and currently smoke or have quit within the past 15 years. Fecal occult blood test (FOBT) of the stool. You may have this test every year starting at age 12. Flexible sigmoidoscopy or colonoscopy. You may have a sigmoidoscopy every 5 years or a colonoscopy every 10 years starting at age 95. Hepatitis C blood test. Hepatitis B blood test. Sexually transmitted disease (STD) testing. Diabetes screening. This is done by checking your blood sugar (glucose) after you have not eaten for a while (fasting). You may have this done every 1-3 years. Bone density scan. This is done to screen for osteoporosis. You may have this done starting at age 10. Mammogram. This may be done every 1-2 years. Talk to your health care provider about how often you should have regular mammograms. Talk with your health care provider about your test results, treatment options, and if necessary, the need for more tests. Vaccines  Your health care provider may recommend certain vaccines, such as: Influenza vaccine. This is recommended every year. Tetanus, diphtheria, and acellular pertussis (Tdap, Td) vaccine. You may need a Td booster every 10 years. Zoster vaccine. You may need this after age 17. Pneumococcal 13-valent conjugate (PCV13) vaccine. One dose  is recommended  after age 49. Pneumococcal polysaccharide (PPSV23) vaccine. One dose is recommended after age 44. Talk to your health care provider about which screenings and vaccines you need and how often you need them. This information is not intended to replace advice given to you by your health care provider. Make sure you discuss any questions you have with your health care provider. Document Released: 11/20/2015 Document Revised: 07/13/2016 Document Reviewed: 08/25/2015 Elsevier Interactive Patient Education  2017 [place] Prevention in the Home Falls can cause injuries. They can happen to people of all ages. There are many things you can do to make your home safe and to help prevent falls. What can I do on the outside of my home? Regularly fix the edges of walkways and driveways and fix any cracks. Remove anything that might make you trip as you walk through a door, such as a raised step or threshold. Trim any bushes or trees on the path to your home. Use bright outdoor lighting. Clear any walking paths of anything that might make someone trip, such as rocks or tools. Regularly check to see if handrails are loose or broken. Make sure that both sides of any steps have handrails. Any raised decks and porches should have guardrails on the edges. Have any leaves, snow, or ice cleared regularly. Use sand or salt on walking paths during winter. Clean up any spills in your garage right away. This includes oil or grease spills. What can I do in the bathroom? Use night lights. Install grab bars by the toilet and in the tub and shower. Do not use towel bars as grab bars. Use non-skid mats or decals in the tub or shower. If you need to sit down in the shower, use a plastic, non-slip stool. Keep the floor dry. Clean up any water that spills on the floor as soon as it happens. Remove soap buildup in the tub or shower regularly. Attach bath mats securely with double-sided non-slip rug tape. Do not  have throw rugs and other things on the floor that can make you trip. What can I do in the bedroom? Use night lights. Make sure that you have a light by your bed that is easy to reach. Do not use any sheets or blankets that are too big for your bed. They should not hang down onto the floor. Have a firm chair that has side arms. You can use this for support while you get dressed. Do not have throw rugs and other things on the floor that can make you trip. What can I do in the kitchen? Clean up any spills right away. Avoid walking on wet floors. Keep items that you use a lot in easy-to-reach places. If you need to reach something above you, use a strong step stool that has a grab bar. Keep electrical cords out of the way. Do not use floor polish or wax that makes floors slippery. If you must use wax, use non-skid floor wax. Do not have throw rugs and other things on the floor that can make you trip. What can I do with my stairs? Do not leave any items on the stairs. Make sure that there are handrails on both sides of the stairs and use them. Fix handrails that are broken or loose. Make sure that handrails are as long as the stairways. Check any carpeting to make sure that it is firmly attached to the stairs. Fix any carpet that is loose or worn. Avoid  having throw rugs at the top or bottom of the stairs. If you do have throw rugs, attach them to the floor with carpet tape. Make sure that you have a light switch at the top of the stairs and the bottom of the stairs. If you do not have them, ask someone to add them for you. What else can I do to help prevent falls? Wear shoes that: Do not have high heels. Have rubber bottoms. Are comfortable and fit you well. Are closed at the toe. Do not wear sandals. If you use a stepladder: Make sure that it is fully opened. Do not climb a closed stepladder. Make sure that both sides of the stepladder are locked into place. Ask someone to hold it for  you, if possible. Clearly mark and make sure that you can see: Any grab bars or handrails. First and last steps. Where the edge of each step is. Use tools that help you move around (mobility aids) if they are needed. These include: Canes. Walkers. Scooters. Crutches. Turn on the lights when you go into a dark area. Replace any light bulbs as soon as they burn out. Set up your furniture so you have a clear path. Avoid moving your furniture around. If any of your floors are uneven, fix them. If there are any pets around you, be aware of where they are. Review your medicines with your doctor. Some medicines can make you feel dizzy. This can increase your chance of falling. Ask your doctor what other things that you can do to help prevent falls. This information is not intended to replace advice given to you by your health care provider. Make sure you discuss any questions you have with your health care provider. Document Released: 08/20/2009 Document Revised: 03/31/2016 Document Reviewed: 11/28/2014 Elsevier Interactive Patient Education  2017 [employer].

## 2021-10-13 NOTE — Telephone Encounter (Signed)
See results note-left a message for the patient to return my call.

## 2021-10-14 ENCOUNTER — Other Ambulatory Visit: Payer: Self-pay | Admitting: *Deleted

## 2021-10-14 ENCOUNTER — Encounter: Payer: Self-pay | Admitting: Neurology

## 2021-10-14 DIAGNOSIS — R9389 Abnormal findings on diagnostic imaging of other specified body structures: Secondary | ICD-10-CM

## 2021-10-19 ENCOUNTER — Telehealth: Payer: Self-pay | Admitting: Family Medicine

## 2021-10-19 ENCOUNTER — Telehealth: Payer: Self-pay

## 2021-10-19 MED ORDER — SYNTHROID 25 MCG PO TABS: ORAL_TABLET | ORAL | 1 refills | Status: DC

## 2021-10-19 NOTE — Telephone Encounter (Signed)
Patient called requesting Rx refill patient stated she is running low due to dose change SYNTHROID 25 MCG tablet Send to  [facility] mail service

## 2021-10-19 NOTE — Addendum Note (Signed)
Addended by: Agnes Lawrence on: 10/19/2021 03:52 PM   Modules accepted: Orders

## 2021-10-19 NOTE — Telephone Encounter (Signed)
Spoke with the patient-see prior message.

## 2021-10-19 NOTE — Telephone Encounter (Signed)
Patient called because she had a missed call and stated the caller ID said RN Coumadin. I let patient know that I did not see anything in the chart, but if they needed her they would give a call back. Patient verbalized understanding

## 2021-10-19 NOTE — Telephone Encounter (Signed)
Spoke with the patient and informed her the Rx was sent to [facility].  I advised the patient to contact [facility] for an appt and she stated she already has an appt and this is not until 01/10/2022.  I advised the patient to contact their office for cancellations and she agreed.

## 2021-11-12 ENCOUNTER — Encounter: Payer: Self-pay | Admitting: Family Medicine

## 2021-11-12 ENCOUNTER — Ambulatory Visit (INDEPENDENT_AMBULATORY_CARE_PROVIDER_SITE_OTHER): Payer: Medicare HMO | Admitting: Family Medicine

## 2021-11-12 VITALS — BP 120/64 | HR 71 | Temp 97.6°F | Ht 60.75 in | Wt 128.3 lb

## 2021-11-12 DIAGNOSIS — E785 Hyperlipidemia, unspecified: Secondary | ICD-10-CM

## 2021-11-12 DIAGNOSIS — E039 Hypothyroidism, unspecified: Secondary | ICD-10-CM | POA: Diagnosis not present

## 2021-11-12 DIAGNOSIS — R739 Hyperglycemia, unspecified: Secondary | ICD-10-CM | POA: Diagnosis not present

## 2021-11-12 DIAGNOSIS — I959 Hypotension, unspecified: Secondary | ICD-10-CM | POA: Diagnosis not present

## 2021-11-12 DIAGNOSIS — F3342 Major depressive disorder, recurrent, in full remission: Secondary | ICD-10-CM

## 2021-11-12 DIAGNOSIS — Z23 Encounter for immunization: Secondary | ICD-10-CM

## 2021-11-12 DIAGNOSIS — Z0000 Encounter for general adult medical examination without abnormal findings: Secondary | ICD-10-CM | POA: Diagnosis not present

## 2021-11-12 DIAGNOSIS — J4540 Moderate persistent asthma, uncomplicated: Secondary | ICD-10-CM

## 2021-11-12 DIAGNOSIS — I639 Cerebral infarction, unspecified: Secondary | ICD-10-CM

## 2021-11-12 LAB — COMPREHENSIVE METABOLIC PANEL
ALT: 18 U/L (ref 0–35)
AST: 26 U/L (ref 0–37)
Albumin: 4 g/dL (ref 3.5–5.2)
Alkaline Phosphatase: 50 U/L (ref 39–117)
BUN: 18 mg/dL (ref 6–23)
CO2: 25 mEq/L (ref 19–32)
Calcium: 9.5 mg/dL (ref 8.4–10.5)
Chloride: 98 mEq/L (ref 96–112)
Creatinine, Ser: 1 mg/dL (ref 0.40–1.20)
GFR: 54.32 mL/min — ABNORMAL LOW (ref >60.00)
Glucose, Bld: 84 mg/dL (ref 70–99)
Potassium: 3.8 mEq/L (ref 3.5–5.1)
Sodium: 134 mEq/L — ABNORMAL LOW (ref 135–145)
Total Bilirubin: 0.4 mg/dL (ref 0.2–1.2)
Total Protein: 7.2 g/dL (ref 6.0–8.3)

## 2021-11-12 LAB — LIPID PANEL
Cholesterol: 149 mg/dL (ref 0–200)
HDL: 56.6 mg/dL (ref >39.00)
LDL Cholesterol: 65 mg/dL (ref 0–99)
NonHDL: 92.81
Total CHOL/HDL Ratio: 3
Triglycerides: 139 mg/dL (ref 0.0–149.0)
VLDL: 27.8 mg/dL (ref 0.0–40.0)

## 2021-11-12 LAB — CBC WITH DIFFERENTIAL/PLATELET
Basophils Absolute: 0.2 10*3/uL — ABNORMAL HIGH (ref 0.0–0.1)
Basophils Relative: 2.1 % (ref 0.0–3.0)
Eosinophils Absolute: 0.8 10*3/uL — ABNORMAL HIGH (ref 0.0–0.7)
Eosinophils Relative: 9.7 % — ABNORMAL HIGH (ref 0.0–5.0)
HCT: 38.3 % (ref 36.0–46.0)
Hemoglobin: 12.5 g/dL (ref 12.0–15.0)
Lymphocytes Relative: 25.9 % (ref 12.0–46.0)
Lymphs Abs: 2.2 10*3/uL (ref 0.7–4.0)
MCHC: 32.8 g/dL (ref 30.0–36.0)
MCV: 96.5 fl (ref 78.0–100.0)
Monocytes Absolute: 0.8 10*3/uL (ref 0.1–1.0)
Monocytes Relative: 8.7 % (ref 3.0–12.0)
Neutro Abs: 4.6 10*3/uL (ref 1.4–7.7)
Neutrophils Relative %: 53.6 % (ref 43.0–77.0)
Platelets: 223 10*3/uL (ref 150.0–400.0)
RBC: 3.97 Mil/uL (ref 3.87–5.11)
RDW: 13.3 % (ref 11.5–15.5)
WBC: 8.7 10*3/uL (ref 4.0–10.5)

## 2021-11-12 LAB — TSH: TSH: 3.03 u[IU]/mL (ref 0.35–5.50)

## 2021-11-12 LAB — HEMOGLOBIN A1C: Hgb A1c MFr Bld: 6 % (ref 4.6–6.5)

## 2021-11-12 NOTE — Progress Notes (Signed)
Kelly Petersen DOB: Aug 09, 1946 Encounter date: 11/12/2021  This is a 76 y.o. 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   Does feel like the trazodone may affect her  Seeing Dr. Tomi Likens - 1/10 (had opening).   Does get some shortness of breath. Does feel that this is worse since having COVID-may 2022. She usually chalks this up to asthma.   Sometimes light headed with quick position changes. Does feel like balance is better than when she was first in here. Sometimes does feel like she is off balance with walking sometimes. Doesn't feel like she is tilting like she was in the past. She is not interested in balance therapy.   She has increased the crestor 40mg  and tolerating this ok.   Follows regularly with dermatology, allergy.   Allergies  Allergen Reactions   Other Itching    Azogantrazine - Sulfa drug for UTI   Current Meds  Medication Sig   ASMANEX 120 METERED DOSES 220 MCG/INH inhaler USE 2 INHALATIONS TWICE A  DAY   Brimonidine Tartrate-Timolol (COMBIGAN OP) Apply 1 drop to eye 2 (two) times daily.   Cyanocobalamin (VITAMIN B 12 PO) Take by mouth.   denosumab (PROLIA) 60 MG/ML SOSY injection Inject 60 mg into the skin every 6 (six) months.   montelukast (SINGULAIR) 10 MG tablet TAKE 1 TABLET BY MOUTH  EVERY NIGHT AT BEDTIME   rosuvastatin (CRESTOR) 5 MG tablet TAKE 1 TABLET BY MOUTH  DAILY   sertraline (ZOLOFT) 25 MG tablet TAKE 1 TABLET BY MOUTH  DAILY (Patient taking differently: Take 12.5 mg by mouth daily.)   SYNTHROID 25 MCG tablet TAKE 1 TABLET(25 MCG) BY MOUTH DAILY ALTERNATING WITH 2 PILLS BEFORE BREAKFAST   traZODone (DESYREL) 50 MG tablet Take 0.5-1 tablets (25-50 mg total) by mouth at bedtime as needed.    Review of Systems  Constitutional:  Negative for activity change,  appetite change, chills, fatigue, fever and unexpected weight change.  HENT:  Negative for congestion, ear pain, hearing loss, sinus pressure, sinus pain, sore throat and trouble swallowing.   Eyes:  Negative for pain and visual disturbance.  Respiratory:  Negative for cough, chest tightness, shortness of breath and wheezing.   Cardiovascular:  Negative for chest pain, palpitations and leg swelling.  Gastrointestinal:  Negative for abdominal pain, blood in stool, constipation, diarrhea, nausea and vomiting.  Genitourinary:  Negative for difficulty urinating and menstrual problem.  Musculoskeletal:  Negative for arthralgias and back pain.  Skin:  Negative for rash.  Neurological:  Negative for dizziness, weakness, numbness and headaches.  Hematological:  Negative for adenopathy. Does not bruise/bleed easily.  Psychiatric/Behavioral:  Negative for sleep disturbance and suicidal ideas. The patient is not nervous/anxious.    Objective:  BP 120/64 (BP Location: Left Arm, Patient Position: Sitting, Cuff Size: Normal)    Pulse 71    Temp 97.6 F (36.4 C) (Oral)    Ht 5' 0.75" (1.543 m)    Wt 128 lb 4.8 oz (58.2 kg)    SpO2 98%    BMI 24.44 kg/m   Weight: 128 lb 4.8 oz (58.2 kg)   BP Readings from Last 3 Encounters:  11/12/21 120/64  10/12/21 102/60  06/28/21 112/68   Wt Readings from Last 3 Encounters:  11/12/21 128 lb 4.8 oz (58.2 kg)  10/12/21  130 lb (59 kg)  06/28/21 123 lb 11.2 oz (56.1 kg)    Physical Exam Constitutional:      General: She is not in acute distress.    Appearance: She is well-developed.  HENT:     Head: Normocephalic and atraumatic.     Right Ear: External ear normal.     Left Ear: External ear normal.     Mouth/Throat:     Pharynx: No oropharyngeal exudate.  Eyes:     Conjunctiva/sclera: Conjunctivae normal.     Pupils: Pupils are equal, round, and reactive to light.  Neck:     Thyroid: No thyromegaly.  Cardiovascular:     Rate and Rhythm: Normal rate and  regular rhythm.     Heart sounds: Normal heart sounds. No murmur heard.   No friction rub. No gallop.  Pulmonary:     Effort: Pulmonary effort is normal.     Breath sounds: Normal breath sounds.  Abdominal:     General: Bowel sounds are normal. There is no distension.     Palpations: Abdomen is soft. There is no mass.     Tenderness: There is no abdominal tenderness. There is no guarding.     Hernia: No hernia is present.  Musculoskeletal:        General: No tenderness or deformity. Normal range of motion.     Cervical back: Normal range of motion and neck supple.  Lymphadenopathy:     Cervical: No cervical adenopathy.  Skin:    General: Skin is warm and dry.     Findings: No rash.  Neurological:     Mental Status: She is alert and oriented to person, place, and time.     Deep Tendon Reflexes: Reflexes normal.     Reflex Scores:      Tricep reflexes are 2+ on the right side and 2+ on the left side.      Bicep reflexes are 2+ on the right side and 2+ on the left side.      Brachioradialis reflexes are 2+ on the right side and 2+ on the left side.      Patellar reflexes are 2+ on the right side and 2+ on the left side. Psychiatric:        Speech: Speech normal.        Behavior: Behavior normal.        Thought Content: Thought content normal.    Assessment/Plan  1. Preventative health care Encouraged regular exercise.  Encouraged her to consider working for balance therapy.  She will think about this.  2. Hypotension, unspecified hypotension type She has always had lower end blood pressures.  She is having some diastolics now in the 19J, also lower heart rates.  I am going to have her follow-up with cardiology so that she can establish care in case needed in the future. - CBC with Differential/Platelet; Future - Comprehensive metabolic panel; Future - Comprehensive metabolic panel - CBC with Differential/Platelet  3. Hyperlipidemia, unspecified hyperlipidemia type She  started Crestor 5 mg daily.  Recheck lipid levels. - Lipid panel; Future - Lipid panel  4. Hyperglycemia - Hemoglobin A1c; Future - Hemoglobin A1c  5. Recurrent major depressive disorder, in full remission ([place]) Mood has been stable.  Taking 12.5 mg Zoloft daily.  6. Hypothyroidism, unspecified type Continues on Synthroid alternating 25 mcg and 50 mcg daily. - Ambulatory referral to Cardiology - TSH; Future - TSH  7. Moderate persistent asthma, unspecified whether complicated Breathing has been well  controlled.  Uses Asmanex daily.  8. Need for prophylactic vaccination against Streptococcus pneumoniae (pneumococcus) - Pneumococcal conjugate vaccine 20-valent (Prevnar 20)  9. Cerebellar infarct: On statin therapy now, bp well controlled (low). She has neuro visit next week and can review follow up plan/imaging with them in more detail.  Return in about 6 months (around 05/12/2022) for Chronic condition visit.     Micheline Rough, MD

## 2021-11-15 ENCOUNTER — Telehealth: Payer: Self-pay | Admitting: *Deleted

## 2021-11-15 NOTE — Telephone Encounter (Signed)
Spoke with the patient and informed her of the message below.  Patient stated she had an Albuterol inhaler once before, the Rx expired, she never used this and declined new Rx as she feels the Asmanex helps when needed.

## 2021-11-15 NOTE — Progress Notes (Signed)
NEUROLOGY CONSULTATION NOTE  Kelly Petersen MRN: [id number] DOB: February 17, 1946  Referring provider: Micheline Rough, MD Primary care provider: Micheline Rough, MD  Reason for consult:  cerebellar infarct  Assessment/Plan:   Transient ischemic attack Dizziness Hyperlipidemia Prediabetes  I think that the chronic cerebellar infarct is an incidental finding.  I know that the orthostatic vitals were negative but I do believe her dizziness is orthostatic in nature as she specifically described lightheadedness brought on by standing.  However, the even in August with facial paresthesias and difficulty getting words out is concerning for TIA.  Will check bilateral carotid ultrasound Will check complete 2D echocardiogram Secondary stroke prevention as managed by PCP: ASA 81mg  daily Statin.  LDL goal less than 70 Hgb A1c goal less than 7 Normotensive blood pressure Further recommendations pending results.  Subjective:  Kelly Petersen is a 76 year old right-handed female with asthma, HLD and prediabetes who presents for cerebellar infarct.  History supplemented by referring provider's notes.  In August, she was at [employer].  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  It lasted a couple of minutes.  She also noted left lower facial tingling.  No facial droop or unilateral weakness.  She was hungry and thought that it may have been due to hypoglycemia.  Afterwards, she began feeling dizzy, which she describes as a lightheadedness and fatigue.  She notices it when she first stands up but if she has been sitting or already on her feet, she feels fine.  Her blood pressure does run low at baseline.  Orthostatic vitals reportedly negative.  She had an MRI of the brain without contrast on 10/04/2021, which was personally reviewed and showed minimal chronic small vessel ischemic changes and a punctate chronic lacunar infarct within the right cerebellar  hemisphere but no acute/subacute abnormalities.  She was started on ASA 81mg  daily and her Crestor was increased from 5mg  to 10mg  daily.  Labs from earlier this month include Hgb A1c 6 and LDL 65.    She has a history of suspected ocular migraines with visual wave-like auras lasting 10-15 minutes.   PAST MEDICAL HISTORY: Past Medical History:  Diagnosis Date   Adrenal mass ([place]) 01/02/2017   -monitored by endocrinologist in the past -reports told did not need further imaging   Asthma    Chicken pox    Depression    Eczema    Endometrial cancer ([place])    Environmental allergies    Glaucoma    Hyperglycemia    Hyperlipidemia    Insomnia    Osteopenia     PAST SURGICAL HISTORY: Past Surgical History:  Procedure Laterality Date   ABDOMINAL HYSTERECTOMY  1999   endometrial cancer   BREAST SURGERY  2003   lumpectomy; benign   CATARACT EXTRACTION     right eye-April 2021--left eye June 2021   ECTOPIC PREGNANCY SURGERY      MEDICATIONS: Current Outpatient Medications on File Prior to Visit  Medication Sig Dispense Refill   ASMANEX 120 METERED DOSES 220 MCG/INH inhaler USE 2 INHALATIONS TWICE A  DAY 3 Inhaler 0   Brimonidine Tartrate-Timolol (COMBIGAN OP) Apply 1 drop to eye 2 (two) times daily.     CALCIUM PO Take 600 mg by mouth 2 (two) times daily. (Patient not taking: Reported on 11/12/2021)     Cyanocobalamin (VITAMIN B 12 PO) Take by mouth.     denosumab (PROLIA) 60 MG/ML SOSY injection Inject 60 mg into  the skin every 6 (six) months.     montelukast (SINGULAIR) 10 MG tablet TAKE 1 TABLET BY MOUTH  EVERY NIGHT AT BEDTIME 90 tablet 1   rosuvastatin (CRESTOR) 5 MG tablet TAKE 1 TABLET BY MOUTH  DAILY 90 tablet 3   sertraline (ZOLOFT) 25 MG tablet TAKE 1 TABLET BY MOUTH  DAILY (Patient taking differently: Take 12.5 mg by mouth daily.) 90 tablet 1   SYNTHROID 25 MCG tablet TAKE 1 TABLET(25 MCG) BY MOUTH DAILY ALTERNATING WITH 2 PILLS BEFORE BREAKFAST 120 tablet 1   traZODone  (DESYREL) 50 MG tablet Take 0.5-1 tablets (25-50 mg total) by mouth at bedtime as needed. 90 tablet 3   No current facility-administered medications on file prior to visit.    ALLERGIES: Allergies  Allergen Reactions   Other Itching    Azogantrazine - Sulfa drug for UTI    FAMILY HISTORY: Family History  Problem Relation Age of Onset   Alzheimer's disease Mother 38   Asthma Mother    Lung cancer Father 33       worked at ship yard, [place] Maternal Grandfather 80   Hyperlipidemia Paternal Grandmother 53   Lung cancer Paternal Grandfather 1    Objective:  Blood pressure (!) 149/73, pulse 84, height 5\' 1"  (1.549 m), weight 128 lb 12.8 oz (58.4 kg), SpO2 94 %. General: No acute distress.  Patient appears well-groomed.   Head:  Normocephalic/atraumatic Eyes:  fundi examined but not visualized Neck: supple, no paraspinal tenderness, full range of motion Back: No paraspinal tenderness Heart: regular rate and rhythm Lungs: Clear to auscultation bilaterally. Vascular: No carotid bruits. Neurological Exam: Mental status: alert and oriented to person, place, and time, recent and remote memory intact, fund of knowledge intact, attention and concentration intact, speech fluent and not dysarthric, language intact. Cranial nerves: CN I: not tested CN II: pupils equal, round and reactive to light, visual fields intact CN III, IV, VI:  full range of motion, no nystagmus, no ptosis CN V: facial sensation intact. CN VII: upper and lower face symmetric CN VIII: hearing intact CN IX, X: gag intact, uvula midline CN XI: sternocleidomastoid and trapezius muscles intact CN XII: tongue midline Bulk & Tone: normal, no fasciculations. Motor:  muscle strength 5/5 throughout Sensation:  Pinprick, temperature and vibratory sensation intact. Deep Tendon Reflexes:  2+ throughout,  toes downgoing.   Finger to nose testing:  Without dysmetria.   Heel to shin:  Without dysmetria.    Gait:  Normal station and stride.  Romberg with sway.    Thank you for allowing me to take part in the care of this patient.  Metta Clines, DO  CC: Micheline Rough, MD

## 2021-11-15 NOTE — Telephone Encounter (Signed)
-----   Message from Caren Macadam, MD sent at 11/14/2021  2:13 PM EST ----- Noticed that she doesn't have rescue inhaler (albuterol) on med list. If she doesn't have current one on hand at home, would be good for her to have one sent in in case needed for wheezing/shortness of breath.

## 2021-11-16 ENCOUNTER — Other Ambulatory Visit: Payer: Self-pay

## 2021-11-16 ENCOUNTER — Ambulatory Visit: Payer: Medicare HMO | Admitting: Neurology

## 2021-11-16 ENCOUNTER — Encounter: Payer: Self-pay | Admitting: Neurology

## 2021-11-16 VITALS — BP 149/73 | HR 84 | Ht 61.0 in | Wt 128.8 lb

## 2021-11-16 DIAGNOSIS — E785 Hyperlipidemia, unspecified: Secondary | ICD-10-CM | POA: Diagnosis not present

## 2021-11-16 DIAGNOSIS — G459 Transient cerebral ischemic attack, unspecified: Secondary | ICD-10-CM

## 2021-11-16 DIAGNOSIS — R7303 Prediabetes: Secondary | ICD-10-CM | POA: Diagnosis not present

## 2021-11-16 NOTE — Patient Instructions (Addendum)
Check bilateral carotid ultrasound Check complete echocardiogram Continue aspirin 81mg  daily Continue Crestor 10mg  daily Further recommendations pending results.

## 2021-11-24 ENCOUNTER — Other Ambulatory Visit: Payer: Self-pay

## 2021-11-24 ENCOUNTER — Ambulatory Visit (HOSPITAL_BASED_OUTPATIENT_CLINIC_OR_DEPARTMENT_OTHER): Payer: Medicare HMO

## 2021-11-24 DIAGNOSIS — G459 Transient cerebral ischemic attack, unspecified: Secondary | ICD-10-CM | POA: Diagnosis present

## 2021-11-24 LAB — ECHOCARDIOGRAM COMPLETE
Area-P 1/2: 3.46 cm2
S' Lateral: 2.3 cm

## 2021-11-25 ENCOUNTER — Ambulatory Visit (HOSPITAL_COMMUNITY)
Admission: RE | Admit: 2021-11-25 | Discharge: 2021-11-25 | Disposition: A | Discharge status: Home / Self Care | Payer: Medicare HMO | Source: Ambulatory Visit | Attending: Internal Medicine | Admitting: Internal Medicine

## 2021-11-25 DIAGNOSIS — G459 Transient cerebral ischemic attack, unspecified: Secondary | ICD-10-CM | POA: Insufficient documentation

## 2021-11-26 ENCOUNTER — Telehealth: Payer: Self-pay | Admitting: Neurology

## 2021-11-26 ENCOUNTER — Other Ambulatory Visit: Payer: Self-pay | Admitting: Family Medicine

## 2021-11-26 NOTE — Telephone Encounter (Signed)
Pt called informed her u/s looks ok as well her heart

## 2021-11-26 NOTE — Telephone Encounter (Signed)
Patient is returning a call to someone about her results

## 2021-12-03 NOTE — Progress Notes (Signed)
CARDIOLOGY CONSULT NOTE       Patient ID: Kelly Petersen MRN: [id number] DOB/AGE: 11-30-1945 76 y.o.  Admit date: (Not on file) Referring Physician: Ethlyn Gallery Primary Physician: Caren Macadam, MD Primary Cardiologist: New Reason for Consultation: Low BP/Bradycardia  Active Problems:   * No active hospital problems. *   HPI:  76 y.o. referred by Dr Ethlyn Gallery for low BP and bradycardia. History of asthma, Eczema, HLD, Depression Asthma and borderline BS She is on statin and synthroid replacement She takes Zoloft and Trazodone Not postural BP in office 120/64 and all encounters 2022-23 not unduly low. Pulse in 70's per chart   Sees neurology Dr Tomi Likens ? Trazodone makes her feel funny  Dizziness and balance issues MRI 10/04/21 Small chronic infarct in the  Right cerebellar hemisphere Started on ASA and statin increased LDL 65 And TSH 3 A1c 6 Dr Tomi Likens felt symptoms may be more related to standing/orthostasis even though not recorded Had some work finding difficulty In August ? History of occular migraines   Carotid duplex normal 11/25/21 TTE 11/24/21 EF 60-65% trivial MR no PFO   Had COVID in May with some dyspnea   History of uterine cancer with 1.7 mm left adrenal adenoma Last imaged in 2009   Not postural in office today by my exam  ROS All other systems reviewed and negative except as noted above  Past Medical History:  Diagnosis Date   Adrenal mass ([place]) 01/02/2017   -monitored by endocrinologist in the past -reports told did not need further imaging   Asthma    Chicken pox    Depression    Eczema    Endometrial cancer ([place])    Environmental allergies    Glaucoma    Hyperglycemia    Hyperlipidemia    Insomnia    Osteopenia     Family History  Problem Relation Age of Onset   Alzheimer's disease Mother 57   Asthma Mother    Lung cancer Father 14       worked at [employer] [place] Maternal Grandfather 43   Dementia Paternal Grandmother     Hyperlipidemia Paternal Grandmother 40   Lung cancer Paternal Grandfather 67    Social History   Socioeconomic History   Marital status: Single    Spouse name: Not on file   Number of children: Not on file   Years of education: Not on file   Highest education level: Not on file  Occupational History   Occupation: retired  Tobacco Use   Smoking status: Never   Smokeless tobacco: Never  Vaping Use   Vaping Use: Never used  Substance and Sexual Activity   Alcohol use: Yes    Comment: 2 drink rarely   Drug use: No   Sexual activity: Not on file  Other Topics Concern   Not on file  Social History Narrative   Work or School: cares for grandchild      Home Situation: lives alone      Spiritual Beliefs: Christian, no church currently      Lifestyle: no regular exercise; diet is poor      Social Determinants of Radio broadcast assistant Strain: Low Risk    Difficulty of Paying Living Expenses: Not hard at all  Food Insecurity: No Food Insecurity   Worried About Charity fundraiser in the Last Year: Never true   [place] in the Last Year: Never true  Transportation Needs: No Transportation  Needs   Lack of Transportation (Medical): No   Lack of Transportation (Non-Medical): No  Physical Activity: Inactive   Days of Exercise per Week: 0 days   Minutes of Exercise per Session: 0 min  Stress: No Stress Concern Present   Feeling of Stress : Not at all  Social Connections: Not on file  Intimate Partner Violence: Not on file    Past Surgical History:  Procedure Laterality Date   ABDOMINAL HYSTERECTOMY  1999   endometrial cancer   BREAST SURGERY  2003   lumpectomy; benign   CATARACT EXTRACTION     right eye-April 2021--left eye June 2021   ECTOPIC PREGNANCY SURGERY        Current Outpatient Medications:    ASMANEX 120 METERED DOSES 220 MCG/INH inhaler, USE 2 INHALATIONS TWICE A  DAY, Disp: 3 Inhaler, Rfl: 0   Brimonidine Tartrate-Timolol (COMBIGAN OP), Apply  1 drop to eye 2 (two) times daily., Disp: , Rfl:    CALCIUM PO, Take 600 mg by mouth 2 (two) times daily., Disp: , Rfl:    Cyanocobalamin (VITAMIN B 12 PO), Take by mouth., Disp: , Rfl:    denosumab (PROLIA) 60 MG/ML SOSY injection, Inject 60 mg into the skin every 6 (six) months., Disp: , Rfl:    montelukast (SINGULAIR) 10 MG tablet, TAKE 1 TABLET BY MOUTH  EVERY NIGHT AT BEDTIME, Disp: 90 tablet, Rfl: 1   rosuvastatin (CRESTOR) 5 MG tablet, Take 2 tablets (10 mg total) by mouth daily., Disp: 90 tablet, Rfl: 3   sertraline (ZOLOFT) 25 MG tablet, TAKE 1 TABLET BY MOUTH  DAILY (Patient taking differently: Take 12.5 mg by mouth daily.), Disp: 90 tablet, Rfl: 1   SYNTHROID 25 MCG tablet, TAKE 1 TABLET(25 MCG) BY MOUTH DAILY ALTERNATING WITH 2 PILLS BEFORE BREAKFAST, Disp: 120 tablet, Rfl: 1   traZODone (DESYREL) 50 MG tablet, Take 0.5-1 tablets (25-50 mg total) by mouth at bedtime as needed., Disp: 90 tablet, Rfl: 3    Physical Exam: Blood pressure 118/68, pulse 74, height 5\' 1"  (1.549 m), weight 131 lb (59.4 kg).    Affect appropriate Healthy:  appears stated age 76: normal Neck supple with no adenopathy JVP normal no bruits no thyromegaly Lungs clear with no wheezing and good diaphragmatic motion Heart:  S1/S2 no murmur, no rub, gallop or click PMI normal Abdomen: benighn, BS positve, no tenderness, no AAA no bruit.  No HSM or HJR Distal pulses intact with no bruits No edema Neuro non-focal Skin warm and dry No muscular weakness   Labs:   Lab Results  Component Value Date   WBC 8.7 11/12/2021   HGB 12.5 11/12/2021   HCT 38.3 11/12/2021   MCV 96.5 11/12/2021   PLT 223.0 11/12/2021   No results for input(s): NA, K, CL, CO2, BUN, CREATININE, CALCIUM, PROT, BILITOT, ALKPHOS, ALT, AST, GLUCOSE in the last 168 hours.  Invalid input(s): LABALBU No results found for: CKTOTAL, CKMB, CKMBINDEX, TROPONINI  Lab Results  Component Value Date   CHOL 149 11/12/2021   CHOL 185  05/12/2021   CHOL 172 11/02/2020   Lab Results  Component Value Date   HDL 56.60 11/12/2021   HDL 54.60 05/12/2021   HDL 53.50 11/02/2020   Lab Results  Component Value Date   LDLCALC 65 11/12/2021   LDLCALC 77 07/24/2019   LDLCALC 98 08/08/2014   Lab Results  Component Value Date   TRIG 139.0 11/12/2021   TRIG 211.0 (H) 05/12/2021   TRIG 314.0 (H) 11/02/2020  Lab Results  Component Value Date   CHOLHDL 3 11/12/2021   CHOLHDL 3 05/12/2021   CHOLHDL 3 11/02/2020   Lab Results  Component Value Date   LDLDIRECT 106.0 05/12/2021   LDLDIRECT 88.0 11/02/2020   LDLDIRECT 93.0 05/01/2020      Radiology: ECHOCARDIOGRAM COMPLETE  Result Date: 11/24/2021    ECHOCARDIOGRAM REPORT   Patient Name:   Kelly Petersen Date of Exam: 11/24/2021 Medical Rec #:  [id number]      Height:       61.0 in Accession #:    [id number]     Weight:       128.8 lb Date of Birth:  May 09, 1946      BSA:          1.567 m Patient Age:    62 years       BP:           149/73 mmHg Patient Gender: F              HR:           75 bpm. Exam Location:  [address] Procedure: 2D Echo, Cardiac Doppler and Color Doppler Indications:    G45.9 TIA  History:        Patient has no prior history of Echocardiogram examinations.                 TIA; Risk Factors:Dyslipidemia.  Sonographer:    Coralyn Helling RDCS Referring Phys: [id number] ADAM R JAFFE IMPRESSIONS  1. Left ventricular ejection fraction, by estimation, is 60 to 65%. The left ventricle has normal function. The left ventricle has no regional wall motion abnormalities. Left ventricular diastolic parameters are consistent with Grade I diastolic dysfunction (impaired relaxation).  2. Right ventricular systolic function is normal. The right ventricular size is normal. There is normal pulmonary artery systolic pressure. The estimated right ventricular systolic pressure is 42.6 mmHg.  3. The mitral valve is normal in structure. Trivial mitral valve regurgitation. No evidence of  mitral stenosis.  4. The aortic valve is tricuspid. Aortic valve regurgitation is not visualized. No aortic stenosis is present.  5. The inferior vena cava is normal in size with greater than 50% respiratory variability, suggesting right atrial pressure of 3 mmHg. FINDINGS  Left Ventricle: Left ventricular ejection fraction, by estimation, is 60 to 65%. The left ventricle has normal function. The left ventricle has no regional wall motion abnormalities. The left ventricular internal cavity size was normal in size. There is  no left ventricular hypertrophy. Left ventricular diastolic parameters are consistent with Grade I diastolic dysfunction (impaired relaxation). Right Ventricle: The right ventricular size is normal. No increase in right ventricular wall thickness. Right ventricular systolic function is normal. There is normal pulmonary artery systolic pressure. The tricuspid regurgitant velocity is 2.50 m/s, and  with an assumed right atrial pressure of 3 mmHg, the estimated right ventricular systolic pressure is 83.4 mmHg. Left Atrium: Left atrial size was normal in size. Right Atrium: Right atrial size was normal in size. Pericardium: There is no evidence of pericardial effusion. Mitral Valve: The mitral valve is normal in structure. Trivial mitral valve regurgitation. No evidence of mitral valve stenosis. Tricuspid Valve: The tricuspid valve is normal in structure. Tricuspid valve regurgitation is trivial. Aortic Valve: The aortic valve is tricuspid. Aortic valve regurgitation is not visualized. No aortic stenosis is present. Pulmonic Valve: The pulmonic valve was normal in structure. Pulmonic valve regurgitation is trivial. Aorta: The aortic root is normal in  size and structure. Venous: The inferior vena cava is normal in size with greater than 50% respiratory variability, suggesting right atrial pressure of 3 mmHg. IAS/Shunts: No atrial level shunt detected by color flow Doppler.  LEFT VENTRICLE PLAX 2D  LVIDd:         3.40 cm Diastology LVIDs:         2.30 cm LV e' medial:    8.38 cm/s LV PW:         1.00 cm LV E/e' medial:  10.9 LV IVS:        1.00 cm LV e' lateral:   12.20 cm/s                        LV E/e' lateral: 7.5  RIGHT VENTRICLE             IVC RV S prime:     19.60 cm/s  IVC diam: 1.50 cm TAPSE (M-mode): 1.7 cm RVSP:           28.0 mmHg LEFT ATRIUM             Index        RIGHT ATRIUM           Index LA diam:        3.10 cm 1.98 cm/m   RA Pressure: 3.00 mmHg LA Vol (A2C):   28.1 ml 17.94 ml/m  RA Area:     7.73 cm LA Vol (A4C):   26.1 ml 16.66 ml/m  RA Volume:   14.40 ml  9.19 ml/m LA Biplane Vol: 29.8 ml 19.02 ml/m  AORTIC VALVE LVOT Vmax:   104.00 cm/s LVOT Vmean:  64.600 cm/s LVOT VTI:    0.214 m  AORTA Ao Asc diam: 2.70 cm MITRAL VALVE                TRICUSPID VALVE MV Area (PHT): 3.46 cm     TR Peak grad:   25.0 mmHg MV Decel Time: 219 msec     TR Vmax:        250.00 cm/s MV E velocity: 91.70 cm/s   Estimated RAP:  3.00 mmHg MV A velocity: 104.00 cm/s  RVSP:           28.0 mmHg MV E/A ratio:  0.88                             SHUNTS                             Systemic VTI: 0.21 m [employer] Electronically signed by Franki Monte Signature Date/Time: 11/24/2021/6:25:51 PM    Final    VAS US CAROTID  Result Date: 11/25/2021 Carotid Arterial Duplex Study Patient Name:  Kelly Petersen  Date of Exam:   11/25/2021 Medical Rec #: [id number]       Accession #:    [id number] Date of Birth: September 13, 1946       Patient Gender: F Patient Age:   61 years Exam Location:  [facility] Procedure:      VAS US CAROTID Referring Phys: ADAM JAFFE --------------------------------------------------------------------------------  Indications:       TIA and patient reports having two episodes of aphasia and                    tingling along the left side of the face within one  month of                    each other. She also c/o dizziness when getting up too                    quickly. She also reports issues  with her blood pressure                    being low. She denies any other cerebrovascular symptoms. Risk Factors:      Hyperlipidemia, no history of smoking. Comparison Study:  NA Performing Technologist: Sharlett Iles RVT  Examination Guidelines: A complete evaluation includes B-mode imaging, spectral Doppler, color Doppler, and power Doppler as needed of all accessible portions of each vessel. Bilateral testing is considered an integral part of a complete examination. Limited examinations for reoccurring indications may be performed as noted.  Right Carotid Findings: +----------+--------+--------+--------+------------------+--------+             PSV cm/s EDV cm/s Stenosis Plaque Description Comments  +----------+--------+--------+--------+------------------+--------+  CCA Prox   81       17                                             +----------+--------+--------+--------+------------------+--------+  CCA Distal 63       17                                             +----------+--------+--------+--------+------------------+--------+  ICA Prox   65       16       Normal                                +----------+--------+--------+--------+------------------+--------+  ICA Mid    62       23                                             +----------+--------+--------+--------+------------------+--------+  ICA Distal 108      35                                   tortuous  +----------+--------+--------+--------+------------------+--------+  ECA        89       11                                             +----------+--------+--------+--------+------------------+--------+ +----------+--------+-------+----------------+-------------------+             PSV cm/s EDV cms Describe         Arm Pressure (mmHG)  +----------+--------+-------+----------------+-------------------+  Subclavian 117              Multiphasic, WNL 100                  +----------+--------+-------+----------------+-------------------+  +---------+--------+--+--------+--+---------+  Vertebral PSV cm/s 48 EDV cm/s 15 Antegrade  +---------+--------+--+--------+--+---------+  Left Carotid Findings: +----------+--------+--------+--------+------------------+----------+  PSV cm/s EDV cm/s Stenosis Plaque Description Comments    +----------+--------+--------+--------+------------------+----------+  CCA Prox   95       20                                               +----------+--------+--------+--------+------------------+----------+  CCA Distal 81       19                                               +----------+--------+--------+--------+------------------+----------+  ICA Prox   46       16       1-39%    heterogenous                   +----------+--------+--------+--------+------------------+----------+  ICA Mid    59       22                                   steep dive  +----------+--------+--------+--------+------------------+----------+  ICA Distal 90       32                                   steep dive  +----------+--------+--------+--------+------------------+----------+  ECA        91       10                heterogenous                   +----------+--------+--------+--------+------------------+----------+ +----------+--------+--------+----------------+-------------------+             PSV cm/s EDV cm/s Describe         Arm Pressure (mmHG)  +----------+--------+--------+----------------+-------------------+  Subclavian 108               Multiphasic, WNL 98                   +----------+--------+--------+----------------+-------------------+ +---------+--------+--+--------+--+---------+  Vertebral PSV cm/s 45 EDV cm/s 11 Antegrade  +---------+--------+--+--------+--+---------+   Summary: Right Carotid: There is no evidence of stenosis in the right ICA. There was no                evidence of thrombus, dissection, atherosclerotic plaque or                stenosis in the cervical carotid system. Left Carotid: Velocities in the left  ICA are consistent with a 1-39% stenosis. Vertebrals:  Bilateral vertebral arteries demonstrate antegrade flow. Subclavians: Normal flow hemodynamics were seen in bilateral subclavian              arteries. *See table(s) above for measurements and observations.  Electronically signed by Larae Grooms MD on 11/25/2021 at 4:49:37 PM.    Final     EKG: SR rate 74 normal    ASSESSMENT AND PLAN:   Dizziness: postural signs negative on multiple occassions Echo normal baseline ECG 30 day monitor to r/o PAF and ILR for any recurrent events  Cerebellar Infarct:  ? Related to above and balance issues F/U Dr Tomi Likens ASA/statin A1c keep < 7 Anxiety/Depression:  ? Limit  Trazodone and Zoloft use Thyroid:  continue synthroid replacement TSH normal Adrenal Adenoma;  presumed non functioning f/u primary  HLD:  continue statin labs with primary  Asthma/Eczema:  Asmanex and singular no active wheezing    30 day monitor   F/U PRN   Signed: Jenkins Rouge 12/06/2021, 10:11 AM

## 2021-12-06 ENCOUNTER — Ambulatory Visit: Payer: Medicare HMO | Admitting: Cardiovascular Disease

## 2021-12-06 ENCOUNTER — Encounter: Payer: Self-pay | Admitting: Cardiovascular Disease

## 2021-12-06 ENCOUNTER — Other Ambulatory Visit: Payer: Self-pay

## 2021-12-06 VITALS — BP 118/68 | HR 74 | Ht 61.0 in | Wt 131.0 lb

## 2021-12-06 DIAGNOSIS — R42 Dizziness and giddiness: Secondary | ICD-10-CM | POA: Diagnosis not present

## 2021-12-06 DIAGNOSIS — F419 Anxiety disorder, unspecified: Secondary | ICD-10-CM

## 2021-12-06 DIAGNOSIS — I480 Paroxysmal atrial fibrillation: Secondary | ICD-10-CM

## 2021-12-06 DIAGNOSIS — E785 Hyperlipidemia, unspecified: Secondary | ICD-10-CM | POA: Diagnosis not present

## 2021-12-06 DIAGNOSIS — I639 Cerebral infarction, unspecified: Secondary | ICD-10-CM

## 2021-12-06 NOTE — Patient Instructions (Addendum)
Medication Instructions:  Your physician recommends that you continue on your current medications as directed. Please refer to the Current Medication list given to you today.  *If you need a refill on your cardiac medications before your next appointment, please call your pharmacy*  Lab Work: If you have labs (blood work) drawn today and your tests are completely normal, you will receive your results only by: [place] (if you have MyChart) OR A paper copy in the mail If you have any lab test that is abnormal or we need to change your treatment, we will call you to review the results.  Testing/Procedures: Your physician has recommended that you wear a 30 day event monitor. Event monitors are medical devices that record the hearts electrical activity. Doctors most often [place] these monitors to diagnose arrhythmias. Arrhythmias are problems with the speed or rhythm of the heartbeat. The monitor is a small, portable device. You can wear one while you do your normal daily activities. This is usually used to diagnose what is causing palpitations/syncope (passing out).   Follow-Up: At [facility], you and your health needs are our priority.  As part of our continuing mission to provide you with exceptional heart care, we have created designated Provider Care Teams.  These Care Teams include your primary Cardiologist (physician) and Advanced Practice Providers (APPs -  Physician Assistants and Nurse Practitioners) who all work together to provide you with the care you need, when you need it.  We recommend signing up for the patient portal called "MyChart".  Sign up information is provided on this After Visit Summary.  MyChart is used to connect with patients for Virtual Visits (Telemedicine).  Patients are able to view lab/test results, encounter notes, upcoming appointments, etc.  Non-urgent messages can be sent to your provider as well.   To learn more about what you can do with MyChart, go to  [email or web address].    Your next appointment:   As needed  The format for your next appointment:   In Person  Provider:   Jenkins Rouge, MD {

## 2021-12-13 ENCOUNTER — Telehealth: Payer: Self-pay | Admitting: Cardiovascular Disease

## 2021-12-13 NOTE — Telephone Encounter (Signed)
Patient states she received her heart monitor, but she is unable to apply it herself. She is requesting an appointment to have it applied. S

## 2021-12-14 ENCOUNTER — Other Ambulatory Visit: Payer: Self-pay | Admitting: Cardiovascular Disease

## 2021-12-14 DIAGNOSIS — R42 Dizziness and giddiness: Secondary | ICD-10-CM

## 2021-12-14 DIAGNOSIS — I480 Paroxysmal atrial fibrillation: Secondary | ICD-10-CM

## 2021-12-14 NOTE — Telephone Encounter (Signed)
Patient is scheduled to come in for help applying monitor on 2/8

## 2021-12-15 ENCOUNTER — Other Ambulatory Visit: Payer: Self-pay

## 2021-12-15 ENCOUNTER — Ambulatory Visit (INDEPENDENT_AMBULATORY_CARE_PROVIDER_SITE_OTHER): Payer: Medicare HMO

## 2021-12-15 DIAGNOSIS — R42 Dizziness and giddiness: Secondary | ICD-10-CM | POA: Diagnosis not present

## 2021-12-15 DIAGNOSIS — I480 Paroxysmal atrial fibrillation: Secondary | ICD-10-CM | POA: Diagnosis not present

## 2021-12-15 NOTE — Progress Notes (Unsigned)
30 day [employer] cardiac event monitor serial # [id number] shipped to patients home and applied in office.

## 2021-12-29 ENCOUNTER — Other Ambulatory Visit: Payer: Self-pay | Admitting: Family Medicine

## 2022-01-10 ENCOUNTER — Ambulatory Visit: Payer: Medicare HMO | Admitting: Neurology

## 2022-03-15 ENCOUNTER — Encounter: Payer: Self-pay | Admitting: Family Medicine

## 2022-03-22 LAB — HM DEXA SCAN

## 2022-03-23 ENCOUNTER — Encounter: Payer: Self-pay | Admitting: Family Medicine

## 2022-03-24 ENCOUNTER — Other Ambulatory Visit: Payer: Self-pay | Admitting: Family Medicine

## 2022-03-24 DIAGNOSIS — G4700 Insomnia, unspecified: Secondary | ICD-10-CM

## 2022-03-29 ENCOUNTER — Encounter: Payer: Self-pay | Admitting: Family Medicine

## 2022-04-27 ENCOUNTER — Other Ambulatory Visit: Payer: Self-pay | Admitting: *Deleted

## 2022-04-27 MED ORDER — ROSUVASTATIN CALCIUM 5 MG PO TABS: 10.0000 mg | ORAL_TABLET | Freq: Every day | ORAL | 0 refills | Status: DC

## 2022-05-11 ENCOUNTER — Other Ambulatory Visit: Payer: Self-pay | Admitting: Family

## 2022-05-31 NOTE — Telephone Encounter (Signed)
Pt is calling checking on the status of chole med

## 2022-06-15 ENCOUNTER — Other Ambulatory Visit: Payer: Self-pay | Admitting: *Deleted

## 2022-06-15 MED ORDER — SYNTHROID 25 MCG PO TABS: ORAL_TABLET | ORAL | 0 refills | Status: DC

## 2022-06-15 NOTE — Telephone Encounter (Signed)
Needs appt

## 2022-08-11 ENCOUNTER — Ambulatory Visit (INDEPENDENT_AMBULATORY_CARE_PROVIDER_SITE_OTHER): Payer: Medicare HMO | Admitting: Family Medicine

## 2022-08-11 ENCOUNTER — Encounter: Payer: Self-pay | Admitting: Family Medicine

## 2022-08-11 VITALS — BP 120/70 | HR 85 | Temp 98.2°F | Ht 61.0 in | Wt 135.4 lb

## 2022-08-11 DIAGNOSIS — R7989 Other specified abnormal findings of blood chemistry: Secondary | ICD-10-CM

## 2022-08-11 DIAGNOSIS — E039 Hypothyroidism, unspecified: Secondary | ICD-10-CM

## 2022-08-11 DIAGNOSIS — R739 Hyperglycemia, unspecified: Secondary | ICD-10-CM | POA: Diagnosis not present

## 2022-08-11 DIAGNOSIS — E782 Mixed hyperlipidemia: Secondary | ICD-10-CM

## 2022-08-11 NOTE — Progress Notes (Signed)
Established Patient Office Visit  Subjective   Patient ID: Kelly Petersen, female    DOB: 08-Jan-1946  Age: 76 y.o. MRN: [id number]  Chief Complaint  Patient presents with   Results    Patient requests to discuss results from Dr Layne Benton (see attached)   Mass    Patient states she is concerned about a mass or larger area noted today along the right clavicle, denies pain    Patient is here for transition of care visit and to discuss lab work. States that she had her prolia injection ad had blood work before the injection. She was told that her kidney function was higher than previous, I looked back at several GFR's that have been done in the past and her GFR has been anywhere from 45-59. We discussed that kidney function can change over time, that it is dependent on a number of factors. Pt reports she is trying to drink more water.   Patient does have a history of pre-DM per the previous A1C's in the chart. She is not currently on medication for this, her fasting glucose on the latest set of labs was 98. Will repeat labs in 3 months at her next visit.     Patient Active Problem List   Diagnosis Date Noted   Elevated serum creatinine 08/12/2022   Hypothyroid 10/23/2019   Insomnia    Eczema    Adrenal nodule ([facility]) 05/12/2018   Hyperglycemia 01/02/2017   Asthma 07/02/2014   Hyperlipemia 07/02/2014   MDD (major depressive disorder) 07/02/2014   Osteopenia 07/02/2014   Allergic rhinitis 07/02/2014   History of uterine cancer 07/02/2014      Review of Systems  All other systems reviewed and are negative.     Objective:     BP 120/70 (BP Location: Left Arm, Patient Position: Sitting, Cuff Size: Normal)   Pulse 85   Temp 98.2 F (36.8 C) (Oral)   Ht '5\' 1"'$  (1.549 m)   Wt 135 lb 6.4 oz (61.4 kg)   SpO2 98%   BMI 25.58 kg/m    Physical Exam Vitals reviewed.  Constitutional:      Appearance: Normal appearance. She is well-groomed and normal weight.  Eyes:      Conjunctiva/sclera: Conjunctivae normal.  Cardiovascular:     Rate and Rhythm: Normal rate and regular rhythm.     Heart sounds: S1 normal and S2 normal.  Pulmonary:     Effort: Pulmonary effort is normal.     Breath sounds: Normal breath sounds and air entry.  Abdominal:     General: Bowel sounds are normal.  Musculoskeletal:     Right lower leg: No edema.     Left lower leg: No edema.  Skin:    General: Skin is warm and dry.  Neurological:     Mental Status: She is alert and oriented to person, place, and time. Mental status is at baseline.     Gait: Gait is intact.  Psychiatric:        Mood and Affect: Mood and affect normal.        Speech: Speech normal.        Behavior: Behavior normal.        Judgment: Judgment normal.      No results found for any visits on 08/11/22.  Last metabolic panel Lab Results  Component Value Date   GLUCOSE 84 11/12/2021   NA 134 (L) 11/12/2021   K 3.8 11/12/2021   CL 98 11/12/2021   CO2  25 11/12/2021   BUN 18 11/12/2021   CREATININE 1.00 11/12/2021   CALCIUM 9.5 11/12/2021   PROT 7.2 11/12/2021   ALBUMIN 4.0 11/12/2021   BILITOT 0.4 11/12/2021   ALKPHOS 50 11/12/2021   AST 26 11/12/2021   ALT 18 11/12/2021   Last hemoglobin A1c Lab Results  Component Value Date   HGBA1C 6.0 11/12/2021      The ASCVD Risk score (Arnett DK, et al., 2019) failed to calculate for the following reasons:   The patient has a prior MI or stroke diagnosis    Assessment & Plan:   Problem List Items Addressed This Visit       Endocrine   Hypothyroid    On 25 mcg daily of levothyroxine, will recheck in 3 months at her next visit.      Relevant Orders   TSH     Other   Hyperlipemia    Reviewed panel from last year, will get new panel in 3 months with the rest of her bloodwork      Relevant Orders   Lipid Panel   Hyperglycemia - Primary    Had A1C of 6.0 in the past indicating Prediabetes, will recheck A1C in 3 months with her annual  labs.       Relevant Orders   CMP   Hemoglobin A1c   Elevated serum creatinine    Unclear etiology, could be just a variation, she has had slightly elevated Cr in the past, I advised increasing fluid intake and will recheck in 3 months to make it is not getting worse.       Return in about 3 months (around 11/15/2022) for Annual Physical Exam.    Farrel Conners, MD

## 2022-08-12 DIAGNOSIS — R7989 Other specified abnormal findings of blood chemistry: Secondary | ICD-10-CM | POA: Insufficient documentation

## 2022-08-12 NOTE — Assessment & Plan Note (Signed)
On 25 mcg daily of levothyroxine, will recheck in 3 months at her next visit.

## 2022-08-12 NOTE — Assessment & Plan Note (Signed)
Had A1C of 6.0 in the past indicating Prediabetes, will recheck A1C in 3 months with her annual labs.

## 2022-08-12 NOTE — Assessment & Plan Note (Signed)
Reviewed panel from last year, will get new panel in 3 months with the rest of her bloodwork

## 2022-08-12 NOTE — Assessment & Plan Note (Signed)
Unclear etiology, could be just a variation, she has had slightly elevated Cr in the past, I advised increasing fluid intake and will recheck in 3 months to make it is not getting worse.

## 2022-09-07 ENCOUNTER — Other Ambulatory Visit: Payer: Self-pay | Admitting: Family Medicine

## 2022-10-13 ENCOUNTER — Telehealth: Payer: Self-pay | Admitting: Family Medicine

## 2022-10-13 ENCOUNTER — Ambulatory Visit (INDEPENDENT_AMBULATORY_CARE_PROVIDER_SITE_OTHER): Payer: Managed Care, Other (non HMO)

## 2022-10-13 VITALS — BP 118/62 | HR 72 | Temp 98.3°F | Ht 61.0 in | Wt 137.6 lb

## 2022-10-13 DIAGNOSIS — Z0000 Encounter for general adult medical examination without abnormal findings: Secondary | ICD-10-CM

## 2022-10-13 DIAGNOSIS — Z0110 Encounter for examination of ears and hearing without abnormal findings: Secondary | ICD-10-CM

## 2022-10-13 NOTE — Telephone Encounter (Signed)
Pt Asthma Dr stated she should get a RSV vaccine and so pt wanted to check with PCP Dr to see if that is necessary?  Please advise.

## 2022-10-13 NOTE — Progress Notes (Signed)
Subjective:   Kelly Petersen is a 76 y.o. female who presents for Medicare Annual (Subsequent) preventive examination.  Review of Systems      Cardiac Risk Factors include: advanced age (>44mn, >>81women)     Objective:    Today's Vitals   10/13/22 1119  BP: 118/62  Pulse: 72  Temp: 98.3 F (36.8 C)  TempSrc: Oral  SpO2: 96%  Weight: 137 lb 9.6 oz (62.4 kg)  Height: '5\' 1"'$  (1.549 m)   Body mass index is 26 kg/m.     10/13/2022   11:38 AM 10/12/2021   11:39 AM 09/29/2020   11:10 AM 03/20/2018   11:19 AM 12/22/2016   10:43 AM  Advanced Directives  Does Patient Have a Medical Advance Directive? Yes Yes Yes Yes Yes  Type of AParamed[place]of [place] will [place] will [place]'sLiving will    Copy of [place] Chart? No - copy requested No - copy requested No - copy requested      Current Medications (verified) Outpatient Encounter Medications as of 10/13/2022  Medication Sig   ASMANEX 120 METERED DOSES 220 MCG/INH inhaler USE 2 INHALATIONS TWICE A  DAY   Brimonidine Tartrate-Timolol (COMBIGAN OP) Apply 1 drop to eye 2 (two) times daily.   CALCIUM PO Take 600 mg by mouth 2 (two) times daily.   Cyanocobalamin (VITAMIN B 12 PO) Take by mouth.   denosumab (PROLIA) 60 MG/ML SOSY injection Inject 60 mg into the skin every 6 (six) months.   mirabegron ER (MYRBETRIQ) 25 MG TB24 tablet 1 tablet   montelukast (SINGULAIR) 10 MG tablet TAKE 1 TABLET BY MOUTH  EVERY NIGHT AT BEDTIME   rosuvastatin (CRESTOR) 5 MG tablet TAKE 2 TABLETS BY MOUTH DAILY   sertraline (ZOLOFT) 25 MG tablet TAKE 1 TABLET BY MOUTH  DAILY (Patient taking differently: Take 12.5 mg by mouth daily.)   SYNTHROID 25 MCG tablet TAKE 1 TABLET BY MOUTH DAILY  ALTERNATING WITH 2 TABLETS BY  MOUTH DAILY BEFORE BREAKFAST   traZODone (DESYREL) 50 MG tablet TAKE 1/2 TO 1 TABLET BY  MOUTH AT BEDTIME AS NEEDED   No  facility-administered encounter medications on file as of 10/13/2022.    Allergies (verified) Other   History: Past Medical History:  Diagnosis Date   Adrenal mass ([place] 01/02[place]   -monitored by endocrinologist in the past -reports told did not need further imaging   Asthma    Chicken pox    Depression    Eczema    [place]ometrial cancer ([place]    Environmental allergies    Glaucoma    Hyperglycemia    Hyperlipidemia    Insomnia    Osteopenia    Past Surgical History:  Procedure Laterality Date   ABDOMINAL HYSTERECTOMY  1999   endometrial cancer   BREAST SURGERY  2003   lumpectomy; benign   CATARACT EXTRACTION     right eye-April 2021--left eye June 2021   ECTOPIC PREGNANCY SURGERY     Family History  Problem Relation Age of Onset   Alzheimer's disease Mother 882  Asthma Mother    Lung cancer Father 810      worked at [place]aternal Grandfather 857  Dementia Paternal Grandmother    Hyperlipidemia Paternal Grandmother 958  Lung cancer Paternal Grandfather 819  Social History   Socioeconomic History   Marital status: Single  Spouse name: Not on file   Number of children: Not on file   Years of education: Not on file   Highest education level: Not on file  Occupational History   Occupation: retired  Tobacco Use   Smoking status: Never   Smokeless tobacco: Never  Vaping Use   Vaping Use: Never used  Substance and Sexual Activity   Alcohol use: Yes    Comment: 2 drink rarely   Drug use: No   Sexual activity: Not on file  Other Topics Concern   Not on file  Social History Narrative   Work or School: cares for grandchild      Home Situation: lives alone      Spiritual Beliefs: Christian, no church currently      Lifestyle: no regular exercise; diet is poor      Social Determinants of Radio broadcast assistant Strain: Low Risk  (10/13/2022)   Overall Financial Resource Strain (CARDIA)    Difficulty of Paying Living  Expenses: Not hard at all  Food Insecurity: No Food Insecurity (10/13/2022)   Hunger Vital Sign    Worried About Running Out of Food in the Last Year: Never true    Joiner in the Last Year: Never true  Transportation Needs: No Transportation Needs (10/13/2022)   PRAPARE - Hydrologist (Medical): No    Lack of Transportation (Non-Medical): No  Physical Activity: Inactive (10/13/2022)   Exercise Vital Sign    Days of Exercise per Week: 0 days    Minutes of Exercise per Session: 0 min  Stress: No Stress Concern Present (10/13/2022)   [place]    Feeling of Stress : Not at all  Social Connections: Socially Isolated (10/13/2022)   Social Connection and Isolation Panel [NHANES]    Frequency of Communication with Friends and Family: More than three times a week    Frequency of Social Gatherings with Friends and Family: More than three times a week    Attends Religious Services: Never    Marine scientist or Organizations: No    Attends Music therapist: Never    Marital Status: Divorced    Tobacco Counseling Counseling given: Not Answered   Clinical Intake:  Pre-visit preparation completed: No  Pain : No/denies pain     BMI - recorded: 26 Nutritional Status: BMI 25 -29 Overweight Nutritional Risks: None Diabetes: No  How often do you need to have someone help you when you read instructions, pamphlets, or other written materials from your doctor or pharmacy?: 1 - Never  Diabetic?  No  Interpreter Needed?: No  Information entered by :: Rolene Arbour LPN   Activities of Daily Living    10/13/2022   11:35 AM  In your present state of health, do you have any difficulty performing the following activities:  Hearing? 0  Vision? 0  Difficulty concentrating or making decisions? 0  Walking or climbing stairs? 0  Dressing or bathing? 0  Doing errands,  shopping? 0  Preparing Food and eating ? N  Using the Toilet? N  In the past six months, have you accidently leaked urine? Y  Comment Wears panty liner. Followed by GYN  Do you have problems with loss of bowel control? N  Managing your Medications? N  Managing your Finances? N  Housekeeping or managing your Housekeeping? N    Patient Care Team: Farrel Conners, MD as  PCP - General (Family Medicine) Josue Hector, MD as PCP - Cardiology (Cardiology) Sanjuana Kava, MD (Inactive) as Referring Physician (Obstetrics and Gynecology) Hillery Jacks, MD as Referring Physician (Dermatology)  Indicate any recent Medical Services you may have received from other than [facility] providers in the past year (date may be approximate).     Assessment:   This is a routine wellness examination for Kelly Petersen.  Hearing/Vision screen Hearing Screening - Comments:: Denies hearing difficulties   Vision Screening - Comments:: Wears reading glasses - up to date with routine eye exams with  Dr Prudencio Burly  Dietary issues and exercise activities discussed: Exercise limited by: None identified   Goals Addressed               This Visit's Progress     Stay Healthy (pt-stated)         Depression Screen    10/13/2022   11:34 AM 08/11/2022    3:25 PM 11/12/2021   11:19 AM 10/12/2021   11:40 AM 05/12/2021    1:07 PM 11/02/2020   10:59 AM 09/29/2020   11:09 AM  PHQ 2/9 Scores  PHQ - 2 Score 0 0 0 0 0 0 0  PHQ- 9 Score  0 2  0 2     Fall Risk    10/13/2022   11:37 AM 10/12/2022    6:29 PM 11/16/2021    2:57 PM 11/12/2021    9:55 AM 10/12/2021   11:40 AM  Fall Risk   Falls in the past year? 0 0 0 0 0  Number falls in past yr: 0  0 0   Injury with Fall? 0  0    Risk for fall due to : No Fall Risks    Medication side effect  Follow up Falls prevention discussed    Falls evaluation completed;Education provided;Falls prevention discussed    FALL RISK PREVENTION PERTAINING TO THE HOME:  Any stairs in or  around the home? Yes  If so, are there any without handrails? No  Home free of loose throw rugs in walkways, pet beds, electrical cords, etc? Yes  Adequate lighting in your home to reduce risk of falls? Yes   ASSISTIVE DEVICES UTILIZED TO PREVENT FALLS:  Life alert? No  Use of a cane, walker or w/c? No  Grab bars in the bathroom? No  Shower chair or bench in shower? No  Elevated toilet seat or a handicapped toilet? Yes   TIMED UP AND GO:  Was the test performed? Yes .  Length of time to ambulate 10 feet: 10 sec.   Gait steady and fast without use of assistive device  Cognitive Function:        10/13/2022   11:38 AM 10/12/2021   11:42 AM 09/29/2020   11:13 AM 03/20/2018   11:25 AM  6CIT Screen  What Year? 0 points 0 points 0 points 0 points  What month? 0 points 0 points 0 points 0 points  What time? 0 points 0 points  0 points  Count back from 20 0 points 0 points 0 points 0 points  Months in reverse 0 points 0 points 0 points 0 points  Repeat phrase 0 points 0 points 0 points 0 points  Total Score 0 points 0 points  0 points    Immunizations Immunization History  Administered Date(s) Administered   Influenza Split 06/27/2013   Influenza, High Dose Seasonal PF 08/08/2014, 08/03/2015, 07/18/2016, 08/21/2017, 07/05/2019, 08/15/2021   Influenza-Unspecified 08/15/2018, 06/25/2019, 08/07/2020,  07/21/2022   Moderna Covid-19 Vaccine Bivalent Booster 73yr & up 08/30/2021   Moderna Sars-Covid-2 Vaccination 12/09/2019, 01/07/2020, 09/23/2020   PNEUMOCOCCAL CONJUGATE-20 11/12/2021   Pneumococcal Conjugate-13 08/08/2014   Pneumococcal Polysaccharide-23 05/15/2003, 11/07/2008, 12/09/2015   Td 05/11/2003   Tdap 11/24/2010, 06/14/2021   Zoster Recombinat (Shingrix) 06/22/2020, 09/01/2020   Zoster, Live 11/15/2011    TDAP status: Up to date  Flu Vaccine status: Up to date  Pneumococcal vaccine status: Up to date  Covid-19 vaccine status: Completed vaccines  Qualifies for  Shingles Vaccine? Yes   Zostavax completed Yes   Shingrix Completed?: Yes  Screening Tests Health Maintenance  Topic Date Due   COVID-19 Vaccine (5 - 2023-24 season) 10/29/2022 (Originally 07/08/2022)   DEXA SCAN  03/22/2024   DTaP/Tdap/Td (4 - Td or Tdap) 06/15/2031   Pneumonia Vaccine 78 Years old  Completed   INFLUENZA VACCINE  Completed   Hepatitis C Screening  Completed   Zoster Vaccines- Shingrix  Completed   HPV VACCINES  Aged Out   COLONOSCOPY (Pts [phone] coverage will need to be confirmed)  Discontinued    Health Maintenance  There are no preventive care reminders to display for this patient.   Colorectal cancer screening: No longer required.   Mammogram status: No longer required due to Age.  Bone Density status: Completed 03/22/22. Results reflect: Bone density results: OSTEOPOROSIS. Repeat every   years.  Lung Cancer Screening: (Low Dose CT Chest recommended if Age 78-80ears, 30 pack-year currently smoking OR have quit w/in 15years.) does not qualify.     Additional Screening:  Hepatitis C Screening: does qualify; Completed 12/14/15  Vision Screening: Recommended annual ophthalmology exams for early detection of glaucoma and other disorders of the eye. Is the patient up to date with their annual eye exam?  Yes  Who is the provider or what is the name of the office in which the patient attends annual eye exams? Dr LyPrudencio Burlyf pt is not established with a provider, would they like to be referred to a provider to establish care? No .   Dental Screening: Recommended annual dental exams for proper oral hygiene  Community Resource Referral / Chronic Care Management:  CRR required this visit?  No   CCM required this visit?  No      Plan:     I have personally reviewed and noted the following in the patient's chart:   Medical and social history Use of alcohol, tobacco or illicit drugs  Current medications and supplements including opioid  prescriptions. Patient is not currently taking opioid prescriptions. Functional ability and status Nutritional status Physical activity Advanced directives List of other physicians Hospitalizations, surgeries, and ER visits in previous 12 months Vitals Screenings to include cognitive, depression, and falls Referrals and appointments  In addition, I have reviewed and discussed with patient certain preventive protocols, quality metrics, and best practice recommendations. A written personalized care plan for preventive services as well as general preventive health recommendations were provided to patient.     BeCriselda PeachesLPN   [id number] Nurse Notes: None

## 2022-10-13 NOTE — Patient Instructions (Addendum)
Kelly Petersen , Thank you for taking time to come for your Medicare Wellness Visit. I appreciate your ongoing commitment to your health goals. Please review the following plan we discussed and let me know if I can assist you in the future.   These are the goals we discussed:  Goals       Eat more fruits and vegetables      Pick up vegetables farmer's  Snack on them Eat more fruit Cut up some vegetables one day and have them ready in the frig Not eat as much fast foods or make different choices one day a week        Patient Stated      fx to heal and go on cruise this year       Patient Stated      Lose weight      Patient Stated      10/12/2021, stay alive      Stay Healthy (pt-stated)        This is a list of the screening recommended for you and due dates:  Health Maintenance  Topic Date Due   COVID-19 Vaccine (5 - 2023-24 season) 10/29/2022*   DEXA scan (bone density measurement)  03/22/2024   DTaP/Tdap/Td vaccine (4 - Td or Tdap) 06/15/2031   Pneumonia Vaccine  Completed   Flu Shot  Completed   Hepatitis C Screening: USPSTF Recommendation to screen - Ages 38-79 yo.  Completed   Zoster (Shingles) Vaccine  Completed   HPV Vaccine  Aged Out   Colon Cancer Screening  Discontinued  *Topic was postponed. The date shown is not the original due date.    Advanced directives: Please bring a copy of your health care power of attorney and living will to the office to be added to your chart at your convenience.   Conditions/risks identified: None  Next appointment: Follow up in one year for your annual wellness visit     Preventive Care 65 Years and Older, Female Preventive care refers to lifestyle choices and visits with your health care provider that can promote health and wellness. What does preventive care include? A yearly physical exam. This is also called an annual well check. Dental exams once or twice a year. Routine eye exams. Ask your health care provider how  often you should have your eyes checked. Personal lifestyle choices, including: Daily care of your teeth and gums. Regular physical activity. Eating a healthy diet. Avoiding tobacco and drug use. Limiting alcohol use. Practicing safe sex. Taking low-dose aspirin every day. Taking vitamin and mineral supplements as recommended by your health care provider. What happens during an annual well check? The services and screenings done by your health care provider during your annual well check will depend on your age, overall health, lifestyle risk factors, and family history of disease. Counseling  Your health care provider may ask you questions about your: Alcohol use. Tobacco use. Drug use. Emotional well-being. Home and relationship well-being. Sexual activity. Eating habits. History of falls. Memory and ability to understand (cognition). Work and work Statistician. Reproductive health. Screening  You may have the following tests or measurements: Height, weight, and BMI. Blood pressure. Lipid and cholesterol levels. These may be checked every 5 years, or more frequently if you are over 48 years old. Skin check. Lung cancer screening. You may have this screening every year starting at age 19 if you have a 30-pack-year history of smoking and currently smoke or have quit within the past  15 years. Fecal occult blood test (FOBT) of the stool. You may have this test every year starting at age 68. Flexible sigmoidoscopy or colonoscopy. You may have a sigmoidoscopy every 5 years or a colonoscopy every 10 years starting at age 103. Hepatitis C blood test. Hepatitis B blood test. Sexually transmitted disease (STD) testing. Diabetes screening. This is done by checking your blood sugar (glucose) after you have not eaten for a while (fasting). You may have this done every 1-3 years. Bone density scan. This is done to screen for osteoporosis. You may have this done starting at age 57. Mammogram.  This may be done every 1-2 years. Talk to your health care provider about how often you should have regular mammograms. Talk with your health care provider about your test results, treatment options, and if necessary, the need for more tests. Vaccines  Your health care provider may recommend certain vaccines, such as: Influenza vaccine. This is recommended every year. Tetanus, diphtheria, and acellular pertussis (Tdap, Td) vaccine. You may need a Td booster every 10 years. Zoster vaccine. You may need this after age 58. Pneumococcal 13-valent conjugate (PCV13) vaccine. One dose is recommended after age 43. Pneumococcal polysaccharide (PPSV23) vaccine. One dose is recommended after age 49. Talk to your health care provider about which screenings and vaccines you need and how often you need them. This information is not intended to replace advice given to you by your health care provider. Make sure you discuss any questions you have with your health care provider. Document Released: 11/20/2015 Document Revised: 07/13/2016 Document Reviewed: 08/25/2015 Elsevier Interactive Patient Education  2017 [place] Prevention in the Home Falls can cause injuries. They can happen to people of all ages. There are many things you can do to make your home safe and to help prevent falls. What can I do on the outside of my home? Regularly fix the edges of walkways and driveways and fix any cracks. Remove anything that might make you trip as you walk through a door, such as a raised step or threshold. Trim any bushes or trees on the path to your home. Use bright outdoor lighting. Clear any walking paths of anything that might make someone trip, such as rocks or tools. Regularly check to see if handrails are loose or broken. Make sure that both sides of any steps have handrails. Any raised decks and porches should have guardrails on the edges. Have any leaves, snow, or ice cleared regularly. Use sand  or salt on walking paths during winter. Clean up any spills in your garage right away. This includes oil or grease spills. What can I do in the bathroom? Use night lights. Install grab bars by the toilet and in the tub and shower. Do not use towel bars as grab bars. Use non-skid mats or decals in the tub or shower. If you need to sit down in the shower, use a plastic, non-slip stool. Keep the floor dry. Clean up any water that spills on the floor as soon as it happens. Remove soap buildup in the tub or shower regularly. Attach bath mats securely with double-sided non-slip rug tape. Do not have throw rugs and other things on the floor that can make you trip. What can I do in the bedroom? Use night lights. Make sure that you have a light by your bed that is easy to reach. Do not use any sheets or blankets that are too big for your bed. They should not hang  down onto the floor. Have a firm chair that has side arms. You can use this for support while you get dressed. Do not have throw rugs and other things on the floor that can make you trip. What can I do in the kitchen? Clean up any spills right away. Avoid walking on wet floors. Keep items that you use a lot in easy-to-reach places. If you need to reach something above you, use a strong step stool that has a grab bar. Keep electrical cords out of the way. Do not use floor polish or wax that makes floors slippery. If you must use wax, use non-skid floor wax. Do not have throw rugs and other things on the floor that can make you trip. What can I do with my stairs? Do not leave any items on the stairs. Make sure that there are handrails on both sides of the stairs and use them. Fix handrails that are broken or loose. Make sure that handrails are as long as the stairways. Check any carpeting to make sure that it is firmly attached to the stairs. Fix any carpet that is loose or worn. Avoid having throw rugs at the top or bottom of the stairs.  If you do have throw rugs, attach them to the floor with carpet tape. Make sure that you have a light switch at the top of the stairs and the bottom of the stairs. If you do not have them, ask someone to add them for you. What else can I do to help prevent falls? Wear shoes that: Do not have high heels. Have rubber bottoms. Are comfortable and fit you well. Are closed at the toe. Do not wear sandals. If you use a stepladder: Make sure that it is fully opened. Do not climb a closed stepladder. Make sure that both sides of the stepladder are locked into place. Ask someone to hold it for you, if possible. Clearly mark and make sure that you can see: Any grab bars or handrails. First and last steps. Where the edge of each step is. Use tools that help you move around (mobility aids) if they are needed. These include: Canes. Walkers. Scooters. Crutches. Turn on the lights when you go into a dark area. Replace any light bulbs as soon as they burn out. Set up your furniture so you have a clear path. Avoid moving your furniture around. If any of your floors are uneven, fix them. If there are any pets around you, be aware of where they are. Review your medicines with your doctor. Some medicines can make you feel dizzy. This can increase your chance of falling. Ask your doctor what other things that you can do to help prevent falls. This information is not intended to replace advice given to you by your health care provider. Make sure you discuss any questions you have with your health care provider. Document Released: 08/20/2009 Document Revised: 03/31/2016 Document Reviewed: 11/28/2014 Elsevier Interactive Patient Education  2017 [employer].

## 2022-10-13 NOTE — Telephone Encounter (Signed)
Yes RSV is recommended for all patients over age 78.

## 2022-10-13 NOTE — Telephone Encounter (Signed)
Spoke with the patient and informed her of the message below during the Medicare Wellness visit today.

## 2022-11-15 ENCOUNTER — Other Ambulatory Visit (INDEPENDENT_AMBULATORY_CARE_PROVIDER_SITE_OTHER): Payer: Medicare HMO

## 2022-11-15 DIAGNOSIS — E039 Hypothyroidism, unspecified: Secondary | ICD-10-CM | POA: Diagnosis not present

## 2022-11-15 DIAGNOSIS — E782 Mixed hyperlipidemia: Secondary | ICD-10-CM

## 2022-11-15 DIAGNOSIS — R739 Hyperglycemia, unspecified: Secondary | ICD-10-CM

## 2022-11-15 LAB — COMPREHENSIVE METABOLIC PANEL
ALT: 24 U/L (ref 0–35)
AST: 26 U/L (ref 0–37)
Albumin: 4.2 g/dL (ref 3.5–5.2)
Alkaline Phosphatase: 52 U/L (ref 39–117)
BUN: 16 mg/dL (ref 6–23)
CO2: 30 mEq/L (ref 19–32)
Calcium: 10 mg/dL (ref 8.4–10.5)
Chloride: 102 mEq/L (ref 96–112)
Creatinine, Ser: 1.17 mg/dL (ref 0.40–1.20)
GFR: 44.68 mL/min — ABNORMAL LOW (ref >60.00)
Glucose, Bld: 89 mg/dL (ref 70–99)
Potassium: 4.3 mEq/L (ref 3.5–5.1)
Sodium: 139 mEq/L (ref 135–145)
Total Bilirubin: 0.5 mg/dL (ref 0.2–1.2)
Total Protein: 7.5 g/dL (ref 6.0–8.3)

## 2022-11-15 LAB — LIPID PANEL
Cholesterol: 147 mg/dL (ref 0–200)
HDL: 57.8 mg/dL (ref >39.00)
LDL Cholesterol: 52 mg/dL (ref 0–99)
NonHDL: 89.62
Total CHOL/HDL Ratio: 3
Triglycerides: 186 mg/dL — ABNORMAL HIGH (ref 0.0–149.0)
VLDL: 37.2 mg/dL (ref 0.0–40.0)

## 2022-11-15 LAB — TSH: TSH: 3.46 u[IU]/mL (ref 0.35–5.50)

## 2022-11-15 LAB — HEMOGLOBIN A1C: Hgb A1c MFr Bld: 6.2 % (ref 4.6–6.5)

## 2022-11-17 ENCOUNTER — Ambulatory Visit: Payer: Medicare HMO | Attending: Audiologist | Admitting: Audiologist

## 2022-11-17 DIAGNOSIS — H903 Sensorineural hearing loss, bilateral: Secondary | ICD-10-CM | POA: Diagnosis present

## 2022-11-17 NOTE — Procedures (Signed)
  [facility] and [place], [place]  [address] [phone]  AUDIOLOGICAL  EVALUATION  NAME: Kelly Petersen     DOB:   18-Apr-1946      MRN: [id number]                                                                                     DATE: 11/17/2022     REFERENT: Farrel Conners, MD STATUS: Outpatient DIAGNOSIS: Sensorineural Hearing Loss Bilateral     History: [name] was seen for an audiological evaluation.  [name] is receiving a hearing evaluation due to concerns for hearing loss after referring a screening with her primary care doctor Farrel Conners, MD. [name] has difficulty hearing in noise or when people are far away. This difficulty began gradually. Her daughter tells [name] she does not hear well and needs hearing aids. No pain or pressure reported in either ear. Tinnitus present in both ears sounding like "outerspace". It is not bothersome and is only present when she is in very quiet environments. 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  Medical history positive for prediabetes which is a risk factor for hearing loss. No other relevant case history reported.   Evaluation:  Otoscopy showed a clear view of the tympanic membranes, bilaterally Tympanometry results were consistent with normal middle ear pressure, bilaterally   Audiometric testing was completed using conventional audiometry with supraural transducer. Speech Recognition Thresholds were 25dB in the right ear and 25dB in the left ear. Word Recognition was performed 40dB SL, scored 100% in the right ear and 100% in the left ear. Pure tone thresholds show normal sloping to moderate high frequency sensorineural hearing loss in each ear.   Results:  The test results were reviewed with [name]. She had normal hearing 500-2kHz that then slopes to a moderate high pitched hearing loss 3-8kHz. [name] needs to communicate face to face with people. She  is missing high frequency speech sounds. Hearing aids only amplify up to 4kHz. She is not yet a hearing aid candidate but needs to monitor her hearing annually.    Recommendations: 1.   Annual audiometric testing recommended to monitor hearing loss progression. Trial hearing aids once hearing loss is impactful on a more regular basis.    34 minutes spent testing and counseling on results.   Alfonse Alpers  Audiologist, Au.D., CCC-A 11/17/2022  11:29 AM  Cc: Farrel Conners, MD

## 2022-11-22 ENCOUNTER — Ambulatory Visit (INDEPENDENT_AMBULATORY_CARE_PROVIDER_SITE_OTHER): Payer: Medicare HMO | Admitting: Family Medicine

## 2022-11-22 ENCOUNTER — Encounter: Payer: Self-pay | Admitting: Family Medicine

## 2022-11-22 VITALS — BP 96/58 | HR 78 | Temp 98.2°F | Ht 62.0 in | Wt 135.1 lb

## 2022-11-22 DIAGNOSIS — Z0000 Encounter for general adult medical examination without abnormal findings: Secondary | ICD-10-CM

## 2022-11-22 DIAGNOSIS — R7303 Prediabetes: Secondary | ICD-10-CM | POA: Diagnosis not present

## 2022-11-22 NOTE — Patient Instructions (Signed)

## 2022-11-22 NOTE — Assessment & Plan Note (Signed)
A1C is 6.2, still in the prediabetes range, we discussed increasing exercise and reducing sugar ad starches in her diet.  Will continue to monitor yearly.

## 2022-11-22 NOTE — Progress Notes (Signed)
Complete physical exam  Patient: Kelly Petersen   DOB: 11-Feb-1946   77 y.o. Female  MRN: [id number]  Subjective:    Chief Complaint  Patient presents with   Annual Exam    Kelly Petersen is a 77 y.o. female who presents today for a complete physical exam. She reports consuming a general diet. She stays active physically, no current exercise regimens. She generally feels well. She reports sleeping well. She does not have additional problems to discuss today.   I reviewed her bloodwork with her from last week. Her GFR remains around 45. Pt reports she is staying hydrated, is urinating a normal amount.  Most recent fall risk assessment:    11/22/2022   10:09 AM  Fall Risk   Falls in the past year? 0  Number falls in past yr: 0  Injury with Fall? 0  Risk for fall due to : No Fall Risks  Follow up Falls evaluation completed     Most recent depression screenings:    11/22/2022   10:08 AM 10/13/2022   11:34 AM  PHQ 2/9 Scores  PHQ - 2 Score 0 0  PHQ- 9 Score 1     Dental: No current dental problems and Receives regular dental care  Patient Active Problem List   Diagnosis Date Noted   Prediabetes 11/22/2022   Elevated serum creatinine 08/12/2022   Hypothyroid 10/23/2019   Insomnia    Adrenal nodule ([place]) 05/12/2018   Asthma 07/02/2014   Hyperlipemia 07/02/2014   Osteopenia 07/02/2014   Allergic rhinitis 07/02/2014   History of uterine cancer 07/02/2014      Patient Care Team: Farrel Conners, MD as PCP - General (Family Medicine) Josue Hector, MD as PCP - Cardiology (Cardiology) Sanjuana Kava, MD (Inactive) as Referring Physician (Obstetrics and Gynecology) Hillery Jacks, MD as Referring Physician (Dermatology)   Outpatient Medications Prior to Visit  Medication Sig   ASMANEX 120 METERED DOSES 220 MCG/INH inhaler USE 2 INHALATIONS TWICE A  DAY   Brimonidine Tartrate-Timolol (COMBIGAN OP) Apply 1 drop to eye 2 (two) times daily.   CALCIUM PO Take 600 mg by  mouth 2 (two) times daily.   Cyanocobalamin (VITAMIN B 12 PO) Take by mouth.   denosumab (PROLIA) 60 MG/ML SOSY injection Inject 60 mg into the skin every 6 (six) months.   mirabegron ER (MYRBETRIQ) 25 MG TB24 tablet 1 tablet   montelukast (SINGULAIR) 10 MG tablet TAKE 1 TABLET BY MOUTH  EVERY NIGHT AT BEDTIME   rosuvastatin (CRESTOR) 5 MG tablet TAKE 2 TABLETS BY MOUTH DAILY   sertraline (ZOLOFT) 25 MG tablet TAKE 1 TABLET BY MOUTH  DAILY (Patient taking differently: Take 12.5 mg by mouth daily.)   SYNTHROID 25 MCG tablet TAKE 1 TABLET BY MOUTH DAILY  ALTERNATING WITH 2 TABLETS BY  MOUTH DAILY BEFORE BREAKFAST   traZODone (DESYREL) 50 MG tablet TAKE 1/2 TO 1 TABLET BY  MOUTH AT BEDTIME AS NEEDED   No facility-administered medications prior to visit.    Review of Systems  HENT:  Negative for hearing loss.   Eyes:  Negative for blurred vision.  Respiratory:  Negative for shortness of breath.   Cardiovascular:  Negative for chest pain.  Gastrointestinal: Negative.   Genitourinary: Negative.   Musculoskeletal:  Negative for back pain.  Neurological:  Negative for headaches.  Psychiatric/Behavioral:  Negative for depression.           Objective:     BP (!) 96/58 (BP Location: Left  Arm, Patient Position: Sitting, Cuff Size: Normal)   Pulse 78   Temp 98.2 F (36.8 C) (Oral)   Ht '5\' 2"'$  (1.575 m)   Wt 135 lb 1.6 oz (61.3 kg)   SpO2 95%   BMI 24.71 kg/m  BP Readings from Last 3 Encounters:  11/22/22 (!) 96/58  10/13/22 118/62  08/11/22 120/70   Wt Readings from Last 3 Encounters:  11/22/22 135 lb 1.6 oz (61.3 kg)  10/13/22 137 lb 9.6 oz (62.4 kg)  08/11/22 135 lb 6.4 oz (61.4 kg)      Physical Exam Vitals reviewed.  Constitutional:      Appearance: Normal appearance. She is well-groomed and normal weight.  HENT:     Right Ear: Tympanic membrane and ear canal normal.     Left Ear: Tympanic membrane and ear canal normal.     Nose: No congestion.     Mouth/Throat:      Mouth: Mucous membranes are moist.     Pharynx: No posterior oropharyngeal erythema.  Eyes:     Conjunctiva/sclera: Conjunctivae normal.  Neck:     Thyroid: No thyromegaly.  Cardiovascular:     Rate and Rhythm: Normal rate and regular rhythm.     Pulses: Normal pulses.     Heart sounds: S1 normal and S2 normal.  Pulmonary:     Effort: Pulmonary effort is normal.     Breath sounds: Normal breath sounds and air entry.  Abdominal:     General: Bowel sounds are normal.  Musculoskeletal:     Right lower leg: No edema.     Left lower leg: No edema.  Neurological:     Mental Status: She is alert and oriented to person, place, and time. Mental status is at baseline.     Gait: Gait is intact.  Psychiatric:        Mood and Affect: Mood and affect normal.        Speech: Speech normal.        Behavior: Behavior normal.        Judgment: Judgment normal.      No results found for any visits on 11/22/22. Last metabolic panel Lab Results  Component Value Date   GLUCOSE 89 11/15/2022   NA 139 11/15/2022   K 4.3 11/15/2022   CL 102 11/15/2022   CO2 30 11/15/2022   BUN 16 11/15/2022   CREATININE 1.17 11/15/2022   CALCIUM 10.0 11/15/2022   PROT 7.5 11/15/2022   ALBUMIN 4.2 11/15/2022   BILITOT 0.5 11/15/2022   ALKPHOS 52 11/15/2022   AST 26 11/15/2022   ALT 24 11/15/2022   Last lipids Lab Results  Component Value Date   CHOL 147 11/15/2022   HDL 57.80 11/15/2022   LDLCALC 52 11/15/2022   LDLDIRECT 106.0 05/12/2021   TRIG 186.0 (H) 11/15/2022   CHOLHDL 3 11/15/2022   Last hemoglobin A1c Lab Results  Component Value Date   HGBA1C 6.2 11/15/2022   Last thyroid functions Lab Results  Component Value Date   TSH 3.46 11/15/2022        Assessment & Plan:    Routine Health Maintenance and Physical Exam  Immunization History  Administered Date(s) Administered   Influenza Split 06/27/2013   Influenza, High Dose Seasonal PF 08/08/2014, 08/03/2015, 07/18/2016,  08/21/2017, 07/05/2019, 08/15/2021   Influenza-Unspecified 08/15/2018, 06/25/2019, 08/07/2020, 07/21/2022   Moderna Covid-19 Vaccine Bivalent Booster 59yr & up 08/30/2021   Moderna Sars-Covid-2 Vaccination 12/09/2019, 01/07/2020, 09/23/2020   PNEUMOCOCCAL CONJUGATE-20 11/12/2021   Pneumococcal  Conjugate-13 08/08/2014   Pneumococcal Polysaccharide-23 05/15/2003, 11/07/2008, 12/09/2015   Respiratory Syncytial Virus Vaccine,Recomb Aduvanted(Arexvy) 10/25/2022   Td 05/11/2003   Tdap 11/24/2010, 06/14/2021   Zoster Recombinat (Shingrix) 06/22/2020, 09/01/2020   Zoster, Live 11/15/2011    Health Maintenance  Topic Date Due   COVID-19 Vaccine (5 - 2023-24 season) 12/08/2022 (Originally 07/08/2022)   Medicare Annual Wellness (AWV)  10/14/2023   DEXA SCAN  03/22/2024   DTaP/Tdap/Td (4 - Td or Tdap) 06/15/2031   Pneumonia Vaccine 43+ Years old  Completed   INFLUENZA VACCINE  Completed   Hepatitis C Screening  Completed   Zoster Vaccines- Shingrix  Completed   HPV VACCINES  Aged Out   COLONOSCOPY (Pts 45-80yr Insurance coverage will need to be confirmed)  Discontinued    The ASCVD Risk score (Arnett DK, et al., 2019) failed to calculate for the following reasons:   The patient has a prior MI or stroke diagnosis   Discussed health benefits of physical activity, and encouraged her to engage in regular exercise appropriate for her age and condition. Handouts also given.  Problem List Items Addressed This Visit       Unprioritized   Prediabetes (Chronic)    A1C is 6.2, still in the prediabetes range, we discussed increasing exercise and reducing sugar ad starches in her diet.  Will continue to monitor yearly.       Other Visit Diagnoses     Healthy adult on routine physical examination    -  Primary     Normal physical exam findings on exam today, she is UTD on her health maintenance, no new symptoms. Will follow up yearly. Return in 1 year (on 11/23/2023) for Annual physical  exam.     BFarrel Conners MD

## 2023-01-29 ENCOUNTER — Telehealth: Payer: Self-pay

## 2023-01-29 NOTE — Telephone Encounter (Signed)
Transfer from Dr. Layne Benton  Last Prolia inj 08/09/22 Next Prolia inj due 02/09/23

## 2023-01-30 ENCOUNTER — Ambulatory Visit: Payer: Medicare HMO | Admitting: Family Medicine

## 2023-01-30 ENCOUNTER — Encounter: Payer: Self-pay | Admitting: Family Medicine

## 2023-01-30 VITALS — BP 130/82 | Ht 62.0 in | Wt 132.0 lb

## 2023-01-30 DIAGNOSIS — M810 Age-related osteoporosis without current pathological fracture: Secondary | ICD-10-CM | POA: Diagnosis not present

## 2023-01-30 NOTE — Patient Instructions (Signed)
Get labwork after you leave today. You are due for your Prolia on or just after 4/4.

## 2023-01-30 NOTE — Progress Notes (Signed)
PCP: Farrel Conners, MD  Subjective:   HPI: Patient is a 77 y.o. female here for osteoporosis.  Patient here to establish care for her osteoporosis from Dr Layne Benton. Prior treatment: Prolia for about 3 years - no side effects History of Hip, Spine, or Wrist Fracture: no Heart disease or stroke: history of stroke; no stroke or MI in past year Cancer: yes, uterine cancer Kidney Disease: no Gastric/Peptic Ulcer: no Gastric bypass surgery: no Severe GERD: no History of seizures: no Age at Menopause: 20 Calcium intake: 600mg  twice a day Vitamin D intake: 1 tab every other day Hormone replacement therapy: yes Smoking history: never Alcohol: 2 glasses per week Exercise: only shopping and taking care of house Major dental work in past year: no Parents with hip/spine fracture: yes - mother with hip fracture  Past Medical History:  Diagnosis Date   Adrenal mass ([place]) 01/02/2017   -monitored by endocrinologist in the past -reports told did not need further imaging   Asthma    Chicken pox    Depression    Eczema    Endometrial cancer ([place])    Environmental allergies    Glaucoma    Hyperglycemia    Hyperlipidemia    Insomnia    Osteopenia     Current Outpatient Medications on File Prior to Visit  Medication Sig Dispense Refill   ASMANEX 120 METERED DOSES 220 MCG/INH inhaler USE 2 INHALATIONS TWICE A  DAY 3 Inhaler 0   Brimonidine Tartrate-Timolol (COMBIGAN OP) Apply 1 drop to eye 2 (two) times daily.     CALCIUM PO Take 600 mg by mouth 2 (two) times daily.     Cyanocobalamin (VITAMIN B 12 PO) Take by mouth.     denosumab (PROLIA) 60 MG/ML SOSY injection Inject 60 mg into the skin every 6 (six) months.     mirabegron ER (MYRBETRIQ) 25 MG TB24 tablet 1 tablet     montelukast (SINGULAIR) 10 MG tablet TAKE 1 TABLET BY MOUTH  EVERY NIGHT AT BEDTIME 90 tablet 1   rosuvastatin (CRESTOR) 5 MG tablet TAKE 2 TABLETS BY MOUTH DAILY 90 tablet 7   sertraline (ZOLOFT) 25 MG tablet TAKE 1  TABLET BY MOUTH  DAILY (Patient taking differently: Take 12.5 mg by mouth daily.) 90 tablet 1   SYNTHROID 25 MCG tablet TAKE 1 TABLET BY MOUTH DAILY  ALTERNATING WITH 2 TABLETS BY  MOUTH DAILY BEFORE BREAKFAST 135 tablet 3   traZODone (DESYREL) 50 MG tablet TAKE 1/2 TO 1 TABLET BY  MOUTH AT BEDTIME AS NEEDED 90 tablet 3   No current facility-administered medications on file prior to visit.    Past Surgical History:  Procedure Laterality Date   ABDOMINAL HYSTERECTOMY  1999   endometrial cancer   BREAST SURGERY  2003   lumpectomy; benign   CATARACT EXTRACTION     right eye-April 2021--left eye June 2021   ECTOPIC PREGNANCY SURGERY      Allergies  Allergen Reactions   Other Itching    Azogantrazine - Sulfa drug for UTI    BP 130/82   Ht 5\' 2"  (1.575 m)   Wt 132 lb (59.9 kg)   BMI 24.14 kg/m       No data to display              No data to display              Objective:  Physical Exam:  Gen: NAD, comfortable in exam room  Dexa 03/22/22 T  Scores: R fem neck -2.0, R total hip -1.0, Spine +0.50 Labs due   Assessment & Plan:  1. Osteoporosis - has been on prolia for 3+ years - will check CMP and Vitamin D levels.  Due for her prolia on 4/4.  Last Dexa 03/22/22 with worst score R fem neck at -2.0.  Continue calcium, vitamin D.  Encourage exercise.    Total visit time 30 minutes including documentation.

## 2023-01-30 NOTE — Telephone Encounter (Signed)
Prolia VOB initiated via [email or web address]  Last Prolia inj 08/09/22 Next Prolia inj due 02/09/23

## 2023-01-31 LAB — COMPREHENSIVE METABOLIC PANEL
ALT: 35 IU/L — ABNORMAL HIGH (ref 0–32)
AST: 37 IU/L (ref 0–40)
Albumin/Globulin Ratio: 1.5 (ref 1.2–2.2)
Albumin: 4.4 g/dL (ref 3.8–4.8)
Alkaline Phosphatase: 82 IU/L (ref 44–121)
BUN/Creatinine Ratio: 16 (ref 12–28)
BUN: 20 mg/dL (ref 8–27)
Bilirubin Total: <0.2 mg/dL (ref 0.0–1.2)
CO2: 26 mmol/L (ref 20–29)
Calcium: 10.7 mg/dL — ABNORMAL HIGH (ref 8.7–10.3)
Chloride: 101 mmol/L (ref 96–106)
Creatinine, Ser: 1.29 mg/dL — ABNORMAL HIGH (ref 0.57–1.00)
Globulin, Total: 3 g/dL (ref 1.5–4.5)
Glucose: 100 mg/dL — ABNORMAL HIGH (ref 70–99)
Potassium: 5.2 mmol/L (ref 3.5–5.2)
Sodium: 142 mmol/L (ref 134–144)
Total Protein: 7.4 g/dL (ref 6.0–8.5)
eGFR: 42 mL/min/{1.73_m2} — ABNORMAL LOW (ref >59)

## 2023-01-31 LAB — VITAMIN D 1,25 DIHYDROXY

## 2023-01-31 NOTE — Addendum Note (Signed)
Addended by: Cyd Silence on: 01/31/2023 02:08 PM   Modules accepted: Orders

## 2023-01-31 NOTE — Addendum Note (Signed)
Addended by: Cyd Silence on: 01/31/2023 01:58 PM   Modules accepted: Orders

## 2023-01-31 NOTE — Addendum Note (Signed)
Addended by: Cyd Silence on: 01/31/2023 01:27 PM   Modules accepted: Orders

## 2023-01-31 NOTE — Addendum Note (Signed)
Addended by: Cyd Silence on: 01/31/2023 02:47 PM   Modules accepted: Orders

## 2023-01-31 NOTE — Addendum Note (Signed)
Addended by: Cyd Silence on: 01/31/2023 02:49 PM   Modules accepted: Orders

## 2023-02-01 NOTE — Telephone Encounter (Signed)
Prior auth required for PROLIA  PA PROCESS DETAILS: Precertification is required. Call [phone] or complete the Precertification form available at [email or web address] 

## 2023-02-02 LAB — SPECIMEN STATUS REPORT

## 2023-02-02 LAB — VITAMIN D 25 HYDROXY (VIT D DEFICIENCY, FRACTURES): Vit D, 25-Hydroxy: 57.3 ng/mL (ref 30.0–100.0)

## 2023-02-04 NOTE — Telephone Encounter (Signed)
Prior Authorization initiated for [facility] via [employer] Case ID: [id number]

## 2023-02-07 NOTE — Telephone Encounter (Signed)
Pt is ready for scheduling on or after 02/09/23  Out-of-pocket cost due at time of visit: $0  Primary: [employer] Medicare [place] Prolia co-insurance: 0% Admin fee co-insurance: 0%  Deductible: does not apply  Prior Auth: APPROVED PA# [id number] Valid: 02/04/23-02/04/24  Secondary: N/A Prolia co-insurance:  Admin fee co-insurance:       ** This summary of benefits is an estimation of the patient's out-of-pocket cost. Exact cost may very based on individual plan coverage.

## 2023-02-07 NOTE — Telephone Encounter (Signed)
APPROVED  PA# [id number] Valid: 02/04/23-02/04/24

## 2023-02-08 ENCOUNTER — Other Ambulatory Visit: Payer: Self-pay

## 2023-02-08 DIAGNOSIS — R7989 Other specified abnormal findings of blood chemistry: Secondary | ICD-10-CM

## 2023-02-10 ENCOUNTER — Encounter: Payer: Self-pay | Admitting: Family Medicine

## 2023-02-10 ENCOUNTER — Ambulatory Visit (INDEPENDENT_AMBULATORY_CARE_PROVIDER_SITE_OTHER): Payer: Medicare HMO | Admitting: Family Medicine

## 2023-02-10 DIAGNOSIS — M810 Age-related osteoporosis without current pathological fracture: Secondary | ICD-10-CM

## 2023-02-10 MED ORDER — DENOSUMAB 60 MG/ML ~~LOC~~ SOSY
60.0000 mg | PREFILLED_SYRINGE | Freq: Once | SUBCUTANEOUS | Status: AC
  Administered 2023-02-10: 60 mg via SUBCUTANEOUS

## 2023-02-10 NOTE — Progress Notes (Signed)
[facility] Attending Note: Chart reviewed and patient appropriate for prolia injection.Marland Kitchen

## 2023-02-10 NOTE — Progress Notes (Signed)
Patient given [place] prolia injection 60mg/ml in her left arm. Patient tolerated injection well without reaction at the injection site. Patient will schedule next injection, which is 6 months from today.  

## 2023-02-11 NOTE — Telephone Encounter (Signed)
Last Prolia inj 02/10/23 Next Prolia inj due 08/13/23

## 2023-02-24 ENCOUNTER — Other Ambulatory Visit: Payer: Self-pay | Admitting: *Deleted

## 2023-02-24 DIAGNOSIS — G4700 Insomnia, unspecified: Secondary | ICD-10-CM

## 2023-02-24 MED ORDER — TRAZODONE HCL 50 MG PO TABS
25.0000 mg | ORAL_TABLET | Freq: Every evening | ORAL | 0 refills | Status: DC | PRN
Start: 2023-02-24 — End: 2023-05-03

## 2023-03-28 LAB — LAB REPORT - SCANNED
Albumin, Urine POC: <3
Creatinine, POC: 25.6 mg/dL
EGFR: 49
Microalb Creat Ratio: <12

## 2023-04-07 ENCOUNTER — Other Ambulatory Visit: Payer: Self-pay | Admitting: Family

## 2023-05-02 ENCOUNTER — Other Ambulatory Visit: Payer: Self-pay | Admitting: Family Medicine

## 2023-05-02 DIAGNOSIS — G4700 Insomnia, unspecified: Secondary | ICD-10-CM

## 2023-05-03 NOTE — Telephone Encounter (Signed)
Rx done. 

## 2023-05-03 NOTE — Telephone Encounter (Signed)
Ok to refill 

## 2023-06-26 ENCOUNTER — Other Ambulatory Visit: Payer: Self-pay | Admitting: Allergy and Immunology

## 2023-06-26 ENCOUNTER — Ambulatory Visit
Admission: RE | Admit: 2023-06-26 | Discharge: 2023-06-26 | Disposition: A | Discharge status: Home / Self Care | Payer: Medicare HMO | Source: Ambulatory Visit | Attending: Allergy and Immunology | Admitting: Allergy and Immunology

## 2023-06-26 DIAGNOSIS — J4540 Moderate persistent asthma, uncomplicated: Secondary | ICD-10-CM

## 2023-07-09 ENCOUNTER — Other Ambulatory Visit: Payer: Self-pay | Admitting: Family Medicine

## 2023-07-09 DIAGNOSIS — G4700 Insomnia, unspecified: Secondary | ICD-10-CM

## 2023-07-17 ENCOUNTER — Telehealth: Payer: Self-pay | Admitting: Family Medicine

## 2023-07-17 NOTE — Telephone Encounter (Signed)
I do not have any record of this patient being on alprazolam-- please let patient know

## 2023-07-17 NOTE — Telephone Encounter (Signed)
Patient informed of the message below.  Patient stated she was calling for a refill on Sertraline 25mg  not Alprazolam.  Stated she has been taking this since 2022 when the Rx was given by  Dr Hassan Rowan, she had a lot of bottles at home and has ran out.  Message sent to PCP.

## 2023-07-17 NOTE — Telephone Encounter (Signed)
Prescription Request  07/17/2023  LOV: 11/22/2022  What is the name of the medication or equipment? Alprazolam ?mg  Have you contacted your pharmacy to request a refill? No   Which pharmacy would you like this sent to?  [facility] - [place],  - [address] [place]  [address] Phone: [phone] Fax: [phone]    Patient notified that their request is being sent to the clinical staff for review and that they should receive a response within 2 business days.   Please advise at [facility] [phone]

## 2023-07-18 MED ORDER — SERTRALINE HCL 25 MG PO TABS: 25.0000 mg | ORAL_TABLET | Freq: Every day | ORAL | 1 refills | Status: DC

## 2023-07-18 NOTE — Telephone Encounter (Signed)
Prolia VOB initiated via [email or web address]  Next Prolia inj DUE: 08/13/23

## 2023-07-18 NOTE — Telephone Encounter (Signed)
Noted  

## 2023-07-18 NOTE — Telephone Encounter (Signed)
Ok script sent.

## 2023-07-21 ENCOUNTER — Ambulatory Visit (INDEPENDENT_AMBULATORY_CARE_PROVIDER_SITE_OTHER): Payer: Medicare HMO

## 2023-07-21 DIAGNOSIS — Z23 Encounter for immunization: Secondary | ICD-10-CM | POA: Diagnosis not present

## 2023-07-23 NOTE — Telephone Encounter (Signed)
Pt ready for scheduling on or after 08/13/23  Out-of-pocket cost due at time of visit: $0  Primary: A[employer]na Medicare Supp[place] Prolia co-insurance: 0% Admin fee co-insurance: 0%  Deductible: does not apply  Prior Auth: APPROVED PA# [id number] Valid: 02/04/23-02/04/24  Secondary: N/A Prolia co-insurance:  Admin fee co-insurance:  Deductible:  Prior Auth:  PA# Valid:     ** This summary of benefits is an estimation of the patient's out-of-pocket cost. Exact cost may very based on individual plan coverage.

## 2023-07-23 NOTE — Telephone Encounter (Signed)
Prior Auth REQUIRED for [employer]  Prior Auth: APPROVED PA# [id number] Valid: 02/04/23-02/04/24   Primary Insurance: Monia Pouch Medicare:  COVERAGE DETAILS: Prolia and administration will be covered at 100%. No deductible, coinsurance or out of pocket max applies. The benefits provided on this Verification of Benefits form are Medical Benefits and are the patient's In-Network benefits for Prolia. If you would like Pharmacy Benefits for Prolia, please call [phone].

## 2023-07-26 ENCOUNTER — Other Ambulatory Visit: Payer: Self-pay | Admitting: Family Medicine

## 2023-08-02 ENCOUNTER — Other Ambulatory Visit: Payer: Self-pay | Admitting: *Deleted

## 2023-08-02 DIAGNOSIS — M810 Age-related osteoporosis without current pathological fracture: Secondary | ICD-10-CM

## 2023-08-09 ENCOUNTER — Encounter: Payer: Self-pay | Admitting: *Deleted

## 2023-08-09 LAB — BASIC METABOLIC PANEL
BUN/Creatinine Ratio: 14 (ref 12–28)
BUN: 16 mg/dL (ref 8–27)
CO2: 26 mmol/L (ref 20–29)
Calcium: 10 mg/dL (ref 8.7–10.3)
Chloride: 98 mmol/L (ref 96–106)
Creatinine, Ser: 1.12 mg/dL — ABNORMAL HIGH (ref 0.57–1.00)
Glucose: 101 mg/dL — ABNORMAL HIGH (ref 70–99)
Potassium: 4.9 mmol/L (ref 3.5–5.2)
Sodium: 137 mmol/L (ref 134–144)
eGFR: 50 mL/min/{1.73_m2} — ABNORMAL LOW (ref >59)

## 2023-08-09 LAB — VITAMIN D 25 HYDROXY (VIT D DEFICIENCY, FRACTURES): Vit D, 25-Hydroxy: 62.9 ng/mL (ref 30.0–100.0)

## 2023-08-15 ENCOUNTER — Ambulatory Visit: Payer: Medicare HMO | Admitting: Family Medicine

## 2023-08-15 DIAGNOSIS — M810 Age-related osteoporosis without current pathological fracture: Secondary | ICD-10-CM | POA: Diagnosis not present

## 2023-08-15 MED ORDER — DENOSUMAB 60 MG/ML ~~LOC~~ SOSY
60.0000 mg | PREFILLED_SYRINGE | Freq: Once | SUBCUTANEOUS | Status: AC
Start: 2023-08-15 — End: 2023-08-15
  Administered 2023-08-15: 60 mg via SUBCUTANEOUS

## 2023-08-15 NOTE — Progress Notes (Signed)
Patient given [place] prolia injection 60mg/ml in her left arm. Patient tolerated injection well without reaction at the injection site. Patient will schedule next injection, which is 6 months from today.  

## 2023-08-17 NOTE — Telephone Encounter (Signed)
Last Prolia inj 08/15/23 Next Prolia inj due 02/14/24

## 2023-10-10 ENCOUNTER — Other Ambulatory Visit: Payer: Self-pay | Admitting: *Deleted

## 2023-10-10 ENCOUNTER — Other Ambulatory Visit: Payer: Self-pay | Admitting: Family Medicine

## 2023-10-11 MED ORDER — ROSUVASTATIN CALCIUM 5 MG PO TABS: 10.0000 mg | ORAL_TABLET | Freq: Every day | ORAL | 0 refills | Status: DC

## 2023-10-12 NOTE — Telephone Encounter (Signed)
Pt called to request all her refills be sent (asap) via:  [facility] - [place], [place] - [address] Phone: [phone]  Fax: [phone]

## 2023-11-13 ENCOUNTER — Other Ambulatory Visit: Payer: Self-pay | Admitting: Family Medicine

## 2023-11-24 ENCOUNTER — Encounter: Payer: Self-pay | Admitting: Family Medicine

## 2023-11-24 ENCOUNTER — Ambulatory Visit (INDEPENDENT_AMBULATORY_CARE_PROVIDER_SITE_OTHER): Payer: Medicare HMO | Admitting: Family Medicine

## 2023-11-24 VITALS — BP 92/58 | HR 70 | Temp 97.8°F | Ht 61.75 in | Wt 137.6 lb

## 2023-11-24 DIAGNOSIS — Z0000 Encounter for general adult medical examination without abnormal findings: Secondary | ICD-10-CM

## 2023-11-24 DIAGNOSIS — E785 Hyperlipidemia, unspecified: Secondary | ICD-10-CM

## 2023-11-24 DIAGNOSIS — E039 Hypothyroidism, unspecified: Secondary | ICD-10-CM

## 2023-11-24 DIAGNOSIS — R7303 Prediabetes: Secondary | ICD-10-CM

## 2023-11-24 MED ORDER — ROSUVASTATIN CALCIUM 10 MG PO TABS: 10.0000 mg | ORAL_TABLET | Freq: Every day | ORAL | 1 refills | Status: DC

## 2023-11-24 NOTE — Patient Instructions (Signed)

## 2023-11-24 NOTE — Progress Notes (Unsigned)
Complete physical exam  Patient: Kelly Petersen   DOB: 1946/10/23   78 y.o. Female  MRN: [id number]  Subjective:    Chief Complaint  Patient presents with  . Annual Exam    Kelly Petersen is a 78 y.o. female who presents today for a complete physical exam. She reports consuming a general diet. The patient does not participate in regular exercise at present. She generally feels well. She reports sleeping fairly well. She does not have additional problems to discuss today.    Most recent fall risk assessment:    11/24/2023   10:25 AM  Fall Risk   Falls in the past year? 0  Number falls in past yr: 0  Injury with Fall? 0  Risk for fall due to : No Fall Risks  Follow up Falls evaluation completed     Most recent depression screenings:    11/24/2023   10:25 AM 11/22/2022   10:08 AM  PHQ 2/9 Scores  PHQ - 2 Score 0 0  PHQ- 9 Score  1    Vision:Within last year and Dental: No current dental problems and Receives regular dental care  Patient Active Problem List   Diagnosis Date Noted  . Prediabetes 11/22/2022  . Elevated serum creatinine 08/12/2022  . Hypothyroid 10/23/2019  . Insomnia   . Adrenal nodule (HCC) 05/12/2018  . Asthma 07/02/2014  . Hyperlipemia 07/02/2014  . Osteopenia 07/02/2014  . Allergic rhinitis 07/02/2014  . History of uterine cancer 07/02/2014      Patient Care Team: Karie Georges, MD as PCP - General (Family Medicine) Wendall Stade, MD as PCP - Cardiology (Cardiology) Essie Hart, MD (Inactive) as Referring Physician (Obstetrics and Gynecology) Boykin Reaper, MD as Referring Physician (Dermatology)   Outpatient Medications Prior to Visit  Medication Sig  . ASMANEX 120 METERED DOSES 220 MCG/INH inhaler USE 2 INHALATIONS TWICE A  DAY  . Brimonidine Tartrate-Timolol (COMBIGAN OP) Apply 1 drop to eye 2 (two) times daily.  Marland Kitchen CALCIUM PO Take 600 mg by mouth 2 (two) times daily.  . Cyanocobalamin (VITAMIN B 12 PO) Take by mouth.  .  denosumab (PROLIA) 60 MG/ML SOSY injection Inject 60 mg into the skin every 6 (six) months.  . montelukast (SINGULAIR) 10 MG tablet TAKE 1 TABLET BY MOUTH  EVERY NIGHT AT BEDTIME  . MYRBETRIQ 50 MG TB24 tablet Take 50 mg by mouth daily.  . sertraline (ZOLOFT) 25 MG tablet TAKE 1 TABLET BY MOUTH DAILY  . SYNTHROID 25 MCG tablet TAKE 1 TABLET BY MOUTH DAILY  ALTERNATING WITH 2 TABLETS BY  MOUTH DAILY BEFORE BREAKFAST  . traZODone (DESYREL) 50 MG tablet TAKE 1/2 TO 1 TABLET BY MOUTH AT BEDTIME AS NEEDED  . [DISCONTINUED] rosuvastatin (CRESTOR) 5 MG tablet TAKE 2 TABLETS BY MOUTH DAILY  . [DISCONTINUED] mirabegron ER (MYRBETRIQ) 25 MG TB24 tablet 1 tablet   No facility-administered medications prior to visit.    Review of Systems  HENT:  Negative for hearing loss.   Eyes:  Negative for blurred vision.  Respiratory:  Negative for shortness of breath.   Cardiovascular:  Negative for chest pain.  Gastrointestinal: Negative.   Genitourinary: Negative.   Musculoskeletal:  Negative for back pain.  Neurological:  Negative for headaches.  Psychiatric/Behavioral:  Negative for depression.        Objective:     BP (!) 92/58   Pulse 70   Temp 97.8 F (36.6 C) (Oral)   Ht 5' 1.75" (1.568 m)  Wt 137 lb 9.6 oz (62.4 kg)   SpO2 98%   BMI 25.37 kg/m  {Vitals History (Optional):23777}  Physical Exam Vitals reviewed.  Constitutional:      Appearance: Normal appearance. She is well-groomed and normal weight.  HENT:     Right Ear: Tympanic membrane and ear canal normal.     Left Ear: Tympanic membrane and ear canal normal.     Mouth/Throat:     Mouth: Mucous membranes are moist.     Pharynx: No posterior oropharyngeal erythema.  Eyes:     Conjunctiva/sclera: Conjunctivae normal.  Neck:     Thyroid: No thyromegaly.  Cardiovascular:     Rate and Rhythm: Normal rate and regular rhythm.     Pulses: Normal pulses.     Heart sounds: S1 normal and S2 normal.  Pulmonary:     Effort:  Pulmonary effort is normal.     Breath sounds: Normal breath sounds and air entry.  Abdominal:     General: Abdomen is flat. Bowel sounds are normal.     Palpations: Abdomen is soft.  Musculoskeletal:     Right lower leg: No edema.     Left lower leg: No edema.  Lymphadenopathy:     Cervical: No cervical adenopathy.  Neurological:     Mental Status: She is alert and oriented to person, place, and time. Mental status is at baseline.     Gait: Gait is intact.  Psychiatric:        Mood and Affect: Mood and affect normal.        Speech: Speech normal.        Behavior: Behavior normal.        Judgment: Judgment normal.     No results found for any visits on 11/24/23. {Show previous labs (optional):23779}    Assessment & Plan:    Routine Health Maintenance and Physical Exam  Immunization History  Administered Date(s) Administered  . Fluad Trivalent(High Dose 65+) 07/21/2023  . Influenza Split 06/27/2013  . Influenza, High Dose Seasonal PF 08/08/2014, 08/03/2015, 07/18/2016, 08/21/2017, 07/05/2019, 08/15/2021  . Influenza-Unspecified 08/15/2018, 06/25/2019, 08/07/2020, 07/21/2022  . Moderna Covid-19 Vaccine Bivalent Booster 67yrs & up 08/30/2021  . Moderna Sars-Covid-2 Vaccination 12/09/2019, 01/07/2020, 09/23/2020  . PNEUMOCOCCAL CONJUGATE-20 11/12/2021  . Pneumococcal Conjugate-13 08/08/2014  . Pneumococcal Polysaccharide-23 05/15/2003, 11/07/2008, 12/09/2015  . Respiratory Syncytial Virus Vaccine,Recomb Aduvanted(Arexvy) 10/25/2022  . Td 05/11/2003  . Tdap 11/24/2010, 06/14/2021  . Zoster Recombinant(Shingrix) 06/22/2020, 09/01/2020  . Zoster, Live 11/15/2011    Health Maintenance  Topic Date Due  . COVID-19 Vaccine (5 - 2024-25 season) 07/09/2023  . Medicare Annual Wellness (AWV)  10/14/2023  . DEXA SCAN  03/22/2024  . DTaP/Tdap/Td (4 - Td or Tdap) 06/15/2031  . Pneumonia Vaccine 84+ Years old  Completed  . INFLUENZA VACCINE  Completed  . Hepatitis C Screening   Completed  . Zoster Vaccines- Shingrix  Completed  . HPV VACCINES  Aged Out  . Colonoscopy  Discontinued    Discussed health benefits of physical activity, and encouraged her to engage in regular exercise appropriate for her age and condition.  Hyperlipidemia, unspecified hyperlipidemia type -     Lipid panel; Future -     Rosuvastatin Calcium; Take 1 tablet (10 mg total) by mouth daily.  Dispense: 90 tablet; Refill: 1  Prediabetes -     Comprehensive metabolic panel; Future -     Hemoglobin A1c; Future  Hypothyroidism, unspecified type -     TSH; Future  Routine  general medical examination at a health care facility    Return in 6 months (on 05/23/2024) for follow up kidney function.     Karie Georges, MD

## 2023-11-28 ENCOUNTER — Other Ambulatory Visit (INDEPENDENT_AMBULATORY_CARE_PROVIDER_SITE_OTHER): Payer: Medicare HMO

## 2023-11-28 DIAGNOSIS — E785 Hyperlipidemia, unspecified: Secondary | ICD-10-CM

## 2023-11-28 DIAGNOSIS — R7303 Prediabetes: Secondary | ICD-10-CM | POA: Diagnosis not present

## 2023-11-28 DIAGNOSIS — E039 Hypothyroidism, unspecified: Secondary | ICD-10-CM

## 2023-11-28 LAB — LIPID PANEL
Cholesterol: 148 mg/dL (ref 0–200)
HDL: 55.4 mg/dL (ref >39.00)
LDL Cholesterol: 59 mg/dL (ref 0–99)
NonHDL: 92.88
Total CHOL/HDL Ratio: 3
Triglycerides: 168 mg/dL — ABNORMAL HIGH (ref 0.0–149.0)
VLDL: 33.6 mg/dL (ref 0.0–40.0)

## 2023-11-28 LAB — COMPREHENSIVE METABOLIC PANEL
ALT: 22 U/L (ref 0–35)
AST: 24 U/L (ref 0–37)
Albumin: 4.3 g/dL (ref 3.5–5.2)
Alkaline Phosphatase: 54 U/L (ref 39–117)
BUN: 16 mg/dL (ref 6–23)
CO2: 29 meq/L (ref 19–32)
Calcium: 9.7 mg/dL (ref 8.4–10.5)
Chloride: 102 meq/L (ref 96–112)
Creatinine, Ser: 1.05 mg/dL (ref 0.40–1.20)
GFR: 50.51 mL/min — ABNORMAL LOW (ref >60.00)
Glucose, Bld: 88 mg/dL (ref 70–99)
Potassium: 5.1 meq/L (ref 3.5–5.1)
Sodium: 138 meq/L (ref 135–145)
Total Bilirubin: 0.5 mg/dL (ref 0.2–1.2)
Total Protein: 7.2 g/dL (ref 6.0–8.3)

## 2023-11-28 LAB — TSH: TSH: 5.5 u[IU]/mL (ref 0.35–5.50)

## 2023-11-28 LAB — HEMOGLOBIN A1C: Hgb A1c MFr Bld: 6.4 % (ref 4.6–6.5)

## 2023-11-29 ENCOUNTER — Encounter: Payer: Self-pay | Admitting: Family Medicine

## 2024-01-22 NOTE — Telephone Encounter (Signed)
 Prolia VOB initiated via [email or web address]  Next Prolia inj DUE: 02/14/24

## 2024-01-27 NOTE — Telephone Encounter (Signed)
 Medical [employer] and [employer] - Prior Authorization REQUIRED   PA PROCESS DETAILS: PA is required. Call [phone] or complete the PA form available at  [email or web address]   Fax completed form to [phone]. Or submit PA  request online at [email or web address]

## 2024-02-06 NOTE — Telephone Encounter (Signed)
 Patient is ready for scheduling on or after: 02/14/24 BUY AND BILL  Out-of-pocket cost due at time of visit: $0  Primary: [employer] Prolia co-insurance:  Admin fee co-insurance:   Deductible: n/a  Prior Auth: Approved PA #: [id number] Valid: 02/06/24 - 02/05/25    ** This summary of benefits is an estimation of the patient's out-of-pocket cost. Exact cost may vary based on individual plan coverage.

## 2024-02-07 ENCOUNTER — Other Ambulatory Visit: Payer: Self-pay | Admitting: Family Medicine

## 2024-02-08 LAB — BASIC METABOLIC PANEL WITH GFR
BUN/Creatinine Ratio: 14 (ref 12–28)
BUN: 14 mg/dL (ref 8–27)
CO2: 24 mmol/L (ref 20–29)
Calcium: 9.8 mg/dL (ref 8.7–10.3)
Chloride: 100 mmol/L (ref 96–106)
Creatinine, Ser: 1 mg/dL (ref 0.57–1.00)
Glucose: 123 mg/dL — ABNORMAL HIGH (ref 70–99)
Potassium: 4.4 mmol/L (ref 3.5–5.2)
Sodium: 139 mmol/L (ref 134–144)
eGFR: 57 mL/min/{1.73_m2} — ABNORMAL LOW (ref >59)

## 2024-02-08 LAB — VITAMIN D 25 HYDROXY (VIT D DEFICIENCY, FRACTURES): Vit D, 25-Hydroxy: 48.7 ng/mL (ref 30.0–100.0)

## 2024-02-14 ENCOUNTER — Ambulatory Visit: Payer: Medicare HMO | Admitting: Family Medicine

## 2024-02-14 DIAGNOSIS — M810 Age-related osteoporosis without current pathological fracture: Secondary | ICD-10-CM

## 2024-02-14 MED ORDER — DENOSUMAB 60 MG/ML ~~LOC~~ SOSY
60.0000 mg | PREFILLED_SYRINGE | Freq: Once | SUBCUTANEOUS | Status: AC
  Administered 2024-02-14: 60 mg via SUBCUTANEOUS

## 2024-02-14 NOTE — Progress Notes (Signed)
Patient given [place] prolia injection 60mg/ml in her left arm. Patient tolerated injection well without reaction at the injection site. Patient will schedule next injection, which is 6 months from today.  

## 2024-02-22 ENCOUNTER — Other Ambulatory Visit: Payer: Self-pay | Admitting: Family Medicine

## 2024-03-07 ENCOUNTER — Other Ambulatory Visit: Payer: Self-pay | Admitting: Family Medicine

## 2024-03-27 ENCOUNTER — Other Ambulatory Visit: Payer: Self-pay | Admitting: Family Medicine

## 2024-03-27 DIAGNOSIS — E785 Hyperlipidemia, unspecified: Secondary | ICD-10-CM

## 2024-03-28 LAB — LAB REPORT - SCANNED
Albumin, Urine POC: <12
Creatinine, POC: 30.4 mg/dL
Microalb Creat Ratio: <39

## 2024-04-05 LAB — HM PAP SMEAR: HM Pap smear: NEGATIVE

## 2024-04-30 ENCOUNTER — Encounter: Payer: Self-pay | Admitting: Family Medicine

## 2024-04-30 ENCOUNTER — Ambulatory Visit: Payer: Medicare HMO | Admitting: Family Medicine

## 2024-04-30 VITALS — BP 98/60 | HR 75 | Temp 98.0°F | Ht 61.75 in | Wt 137.5 lb

## 2024-04-30 DIAGNOSIS — R7303 Prediabetes: Secondary | ICD-10-CM

## 2024-04-30 DIAGNOSIS — Z1211 Encounter for screening for malignant neoplasm of colon: Secondary | ICD-10-CM

## 2024-04-30 LAB — POCT GLYCOSYLATED HEMOGLOBIN (HGB A1C): Hemoglobin A1C: 6 % — AB (ref 4.0–5.6)

## 2024-04-30 NOTE — Progress Notes (Signed)
 Established Patient Office Visit  Subjective   Patient ID: Kelly Petersen, female    DOB: Jan 15, 1946  Age: 78 y.o. MRN: [id number]  Chief Complaint  Patient presents with   Medical Management of Chronic Issues    Pt is here for follow up on kidney function and other chronic medical conditions.   Pt states that she saw the dermatologist for a spot on her nose, is using a cream for this. States that her allergist passed away.  Prediabetes-- A1C performed in office today and is 6.0, better from previous. She reports she is watching her diet but doesn't exercise much due to her breathing issues. I counseled the patient on simple/low impact exercises she can do at home.  Reviewed health maintenance, se is getting her mammograms with her GYN provider, will ask for records. Has been 10 years since last colonoscopy, pt is overall very healthy and life expectany >10 years, will order new colonoscopy.     Current Outpatient Medications  Medication Instructions   ASMANEX  120 METERED DOSES 220 MCG/INH inhaler USE 2 INHALATIONS TWICE A  DAY   Brimonidine Tartrate-Timolol (COMBIGAN OP) 1 drop, 2 times daily   CALCIUM  PO 600 mg, 2 times daily   Cyanocobalamin (VITAMIN B 12 PO) Take by mouth.   denosumab  (PROLIA ) 60 mg, Every 6 months   montelukast  (SINGULAIR ) 10 MG tablet TAKE 1 TABLET BY MOUTH  EVERY NIGHT AT BEDTIME   Myrbetriq 50 mg, Daily   rosuvastatin  (CRESTOR ) 10 mg, Oral, Daily   sertraline  (ZOLOFT ) 25 mg, Oral, Daily   SYNTHROID  25 MCG tablet TAKE 1 TABLET BY MOUTH DAILY  ALTERNATING WITH 2 TABLETS BY  MOUTH DAILY BEFORE BREAKFAST   traZODone  (DESYREL ) 25-50 mg, Oral, At bedtime PRN    Patient Active Problem List   Diagnosis Date Noted   Prediabetes 11/22/2022   Elevated serum creatinine 08/12/2022   Hypothyroid 10/23/2019   Insomnia    Adrenal nodule (HCC) 05/12/2018   Asthma 07/02/2014   Hyperlipemia 07/02/2014   Osteopenia 07/02/2014   Allergic rhinitis 07/02/2014    History of uterine cancer 07/02/2014      Review of Systems  All other systems reviewed and are negative.     Objective:     BP 98/60   Pulse 75   Temp 98 F (36.7 C) (Oral)   Ht 5' 1.75 (1.568 m)   Wt 137 lb 8 oz (62.4 kg)   SpO2 98%   BMI 25.35 kg/m    Physical Exam Vitals reviewed.  Constitutional:      Appearance: Normal appearance. She is well-groomed and normal weight.   Cardiovascular:     Rate and Rhythm: Normal rate and regular rhythm.     Heart sounds: S1 normal and S2 normal.  Pulmonary:     Effort: Pulmonary effort is normal.     Breath sounds: Normal breath sounds and air entry.  Abdominal:     General: Bowel sounds are normal.   Musculoskeletal:     Right lower leg: No edema.     Left lower leg: No edema.   Neurological:     Mental Status: She is alert and oriented to person, place, and time. Mental status is at baseline.     Gait: Gait is intact.   Psychiatric:        Mood and Affect: Mood and affect normal.        Speech: Speech normal.        Behavior: Behavior normal.  Results for orders placed or performed in visit on 04/30/24  POC HgB A1c  Result Value Ref Range   Hemoglobin A1C 6.0 (A) 4.0 - 5.6 %   HbA1c POC (<> result, manual entry)     HbA1c, POC (prediabetic range)     HbA1c, POC (controlled diabetic range)        The ASCVD Risk score (Arnett DK, et al., 2019) failed to calculate for the following reasons:   The 2019 ASCVD risk score is only valid for ages 26 to 12   Risk score cannot be calculated because patient has a medical history suggesting prior/existing ASCVD    Assessment & Plan:  Prediabetes Assessment & Plan: A1C is 6.0, still in the prediabetes range, we discussed increasing exercise and reducing sugar ad starches in her diet.  Will continue to monitor yearly.   Orders: -     POCT glycosylated hemoglobin (Hb A1C)  Colon cancer screening -     Ambulatory referral to Gastroenterology     Return  in about 6 months (around 10/30/2024) for annual physical exam.    Heron CHRISTELLA Sharper, MD

## 2024-04-30 NOTE — Assessment & Plan Note (Signed)
 A1C is 6.0, still in the prediabetes range, we discussed increasing exercise and reducing sugar ad starches in her diet.  Will continue to monitor yearly.

## 2024-06-13 ENCOUNTER — Encounter: Payer: Self-pay | Admitting: Gastroenterology

## 2024-06-13 ENCOUNTER — Ambulatory Visit: Admitting: Gastroenterology

## 2024-06-13 VITALS — BP 128/86 | HR 70 | Ht 61.75 in | Wt 137.2 lb

## 2024-06-13 DIAGNOSIS — K5909 Other constipation: Secondary | ICD-10-CM | POA: Diagnosis not present

## 2024-06-13 DIAGNOSIS — Z1211 Encounter for screening for malignant neoplasm of colon: Secondary | ICD-10-CM

## 2024-06-13 NOTE — Patient Instructions (Addendum)
 Recommend high fiber diet  OTC Citrucel 1 tsp po daily   _______________________________________________________  If your blood pressure at your visit was 140/90 or greater, please contact your primary care physician to follow up on this.  _______________________________________________________  If you are age 80 or older, your body mass index should be between 23-30. Your Body mass index is 25.3 kg/m. If this is out of the aforementioned range listed, please consider follow up with your Primary Care Provider.  If you are age 62 or younger, your body mass index should be between 19-25. Your Body mass index is 25.3 kg/m. If this is out of the aformentioned range listed, please consider follow up with your Primary Care Provider.   ________________________________________________________  The [facility] providers would like to encourage you to use MYCHART to communicate with providers for non-urgent requests or questions.  Due to long hold times on the telephone, sending your provider a message by [facility] may be a faster and more efficient way to get a response.  Please allow 48 business hours for a response.  Please remember that this is for non-urgent requests.  _______________________________________________________  [facility] is using a team-based approach to care.  Your team is made up of your doctor and two to three APPS. Our APPS (Nurse Practitioners and Physician Assistants) work with your physician to ensure care continuity for you. They are fully qualified to address your health concerns and develop a treatment plan. They communicate directly with your gastroenterologist to care for you. Seeing the Advanced Practice Practitioners on your physician's team can help you by facilitating care more promptly, often allowing for earlier appointments, access to diagnostic testing, procedures, and other specialty referrals.   Thank you for trusting me with your gastrointestinal  care. Deanna May, FNP-C

## 2024-06-13 NOTE — Progress Notes (Signed)
 Chief Complaint:Colon cancer screening  Primary GI Doctor: Dr. Suzann   HPI:  Patient is a  78   year old female patient with past medical history of uterine CA, hypothyroidism, Prediabetes, who was referred to me by Ozell Heron HERO, MD on 04/30/24 for a evaluation of colon cancer screening.    Interval History    Patient presents to discuss issues with mild constipation and colon screening colonoscopy.  Patient reports she has had issues with chronic constipation her whole life.  Patient has never tried increasing fiber or trying anything over-the-counter such as laxatives or stool softeners.  Patient states she will have bowel movement daily however sometimes it is small amount.  She would like to pursue conservative measures to keep herself regular.  Patient denies abdominal pain or blood in stool. Patient also reports pursuing another colonoscopy.  She tells me she has had 2 previous colonoscopies that were negative.  Patient reports she wants to stay very healthy and be around to see her grandson graduate from high school.  Patient would like to discuss the pros and cons of pursuing endoscopic procedures.  No family history of colon cancer. Patient's family history includes father and paternal grandfather with lung CA. Patient with personal history of uterine cancer.  Patient denies any upper GI issues.  Patient reports she had stroke about 2 years ago but not on any blood thinners.  Patient does tell me she lifts light weights several days a week.  She moved here from [place] , and has one daughter who lives here.   Wt Readings from Last 3 Encounters:  06/13/24 137 lb 3.2 oz (62.2 kg)  04/30/24 137 lb 8 oz (62.4 kg)  11/24/23 137 lb 9.6 oz (62.4 kg)     Past Medical History:  Diagnosis Date   Adrenal mass (HCC) 01/02/2017   -monitored by endocrinologist in the past -reports told did not need further imaging   Asthma    Chicken pox    Depression    Eczema    Endometrial  cancer (HCC)    Environmental allergies    Glaucoma    Hyperglycemia    Hyperlipidemia    Insomnia    Osteopenia    Uterine cancer (HCC)     Past Surgical History:  Procedure Laterality Date   ABDOMINAL HYSTERECTOMY  1999   endometrial cancer   BREAST SURGERY  2003   lumpectomy; benign   CATARACT EXTRACTION     right eye-April 2021--left eye June 2021   ECTOPIC PREGNANCY SURGERY      Current Outpatient Medications  Medication Sig Dispense Refill   ASMANEX  120 METERED DOSES 220 MCG/INH inhaler USE 2 INHALATIONS TWICE A  DAY 3 Inhaler 0   Brimonidine Tartrate-Timolol (COMBIGAN OP) Apply 1 drop to eye 2 (two) times daily.     CALCIUM  PO Take 600 mg by mouth 2 (two) times daily. (Patient taking differently: Take 600 mg by mouth daily.)     Cyanocobalamin (VITAMIN B 12 PO) Take by mouth.     denosumab  (PROLIA ) 60 MG/ML SOSY injection Inject 60 mg into the skin every 6 (six) months.     montelukast  (SINGULAIR ) 10 MG tablet TAKE 1 TABLET BY MOUTH  EVERY NIGHT AT BEDTIME 90 tablet 1   MYRBETRIQ 50 MG TB24 tablet Take 50 mg by mouth daily.     rosuvastatin  (CRESTOR ) 10 MG tablet TAKE 1 TABLET BY MOUTH DAILY 90 tablet 1   sertraline  (ZOLOFT ) 25 MG tablet TAKE 1  TABLET BY MOUTH DAILY (Patient taking differently: Take 25 mg by mouth daily. Taking half tablet daily) 90 tablet 3   SYNTHROID  25 MCG tablet TAKE 1 TABLET BY MOUTH DAILY  ALTERNATING WITH 2 TABLETS BY  MOUTH DAILY BEFORE BREAKFAST 135 tablet 1   traZODone  (DESYREL ) 50 MG tablet TAKE 1/2 TO 1 TABLET BY MOUTH AT BEDTIME AS NEEDED 90 tablet 3   No current facility-administered medications for this visit.    Allergies as of 06/13/2024 - Review Complete 06/13/2024  Allergen Reaction Noted   Other Itching 08/08/2014    Family History  Problem Relation Age of Onset   Alzheimer's disease Mother 52   Asthma Mother    Lung cancer Father 96       worked at ship [employer], [employer]   Cancer Maternal Grandfather 60   Dementia Paternal  Grandmother    Hyperlipidemia Paternal Grandmother 55   Lung cancer Paternal Grandfather 83    Review of Systems:    Constitutional: No weight loss, fever, chills, weakness or fatigue HEENT: Eyes: No change in vision               Ears, Nose, Throat:  No change in hearing or congestion Skin: No rash or itching Cardiovascular: No chest pain, chest pressure or palpitations   Respiratory: No SOB or cough Gastrointestinal: See HPI and otherwise negative Genitourinary: No dysuria or change in urinary frequency Neurological: No headache, dizziness or syncope Musculoskeletal: No new muscle or joint pain Hematologic: No bleeding or bruising Psychiatric: No history of depression or anxiety    Physical Exam:  Vital signs: BP 128/86   Pulse 70   Ht 5' 1.75 (1.568 m)   Wt 137 lb 3.2 oz (62.2 kg)   BMI 25.30 kg/m   Constitutional:   Pleasant female appears to be in NAD, Well developed, Well nourished, alert and cooperative. Throat: Oral cavity and pharynx without inflammation, swelling or lesion.  Respiratory: Respirations even and unlabored. Lungs clear to auscultation bilaterally.   No wheezes, crackles, or rhonchi.  Cardiovascular: Normal S1, S2. Regular rate and rhythm. No peripheral edema, cyanosis or pallor.  Gastrointestinal:  Soft, nondistended, nontender. No rebound or guarding. Normal bowel sounds. No appreciable masses or hepatomegaly. Rectal:  Not performed.  Msk:  Symmetrical without gross deformities. Without edema, no deformity or joint abnormality.  Neurologic:  Alert and  oriented x4;  grossly normal neurologically.  Skin:   Dry and intact without significant lesions or rashes.  RELEVANT LABS AND IMAGING: CBC    Latest Ref Rng & Units 11/12/2021   11:16 AM 06/28/2021    1:17 PM 05/12/2021   11:59 AM  CBC  WBC 4.0 - 10.5 K/uL 8.7  9.0  8.9   Hemoglobin 12.0 - 15.0 g/dL 87.4  86.1  86.1   Hematocrit 36.0 - 46.0 % 38.3  42.2  41.0   Platelets 150.0 - 400.0 K/uL 223.0   252.0  264.0      CMP     Latest Ref Rng & Units 02/07/2024   10:12 AM 11/28/2023    9:20 AM 08/08/2023   10:59 AM  CMP  Glucose 70 - 99 mg/dL 876  88  898   BUN 8 - 27 mg/dL 14  16  16    Creatinine 0.57 - 1.00 mg/dL 8.99  8.94  8.87   Sodium 134 - 144 mmol/L 139  138  137   Potassium 3.5 - 5.2 mmol/L 4.4  5.1  4.9  Chloride 96 - 106 mmol/L 100  102  98   CO2 20 - 29 mmol/L 24  29  26    Calcium  8.7 - 10.3 mg/dL 9.8  9.7  89.9   Total Protein 6.0 - 8.3 g/dL  7.2    Total Bilirubin 0.2 - 1.2 mg/dL  0.5    Alkaline Phos 39 - 117 U/L  54    AST 0 - 37 U/L  24    ALT 0 - 35 U/L  22       Lab Results  Component Value Date   TSH 5.50 11/28/2023   11/2021 echo- Left ventricular ejection fraction, by estimation, is 60 to 65%  12/18/2013 colonoscopy Decreased sphincter tone, nonthrombosed external hemorrhoids and nonthrombosed internal hemorrhoids found on digital exam.  Nonbleeding external hemorrhoids.  The examination was otherwise normal.  No specimens collected.  The exam was otherwise normal to the cecum. 05/23/2008 colonoscopy Rectal exam revealed nonthrombosed internal hemorrhoids.  Rectal exam revealed nonthrombosed external hemorrhoids.  Nonbleeding external and internal hemorrhoids.  The exam was otherwise normal to the cecum.  Assessment: Encounter Diagnoses  Name Primary?   Chronic constipation Yes   Special screening for malignant neoplasms, colon         Very healthy 78 year old female patient who presents with mild constipation and we discussed initially starting with high-fiber diet and fiber supplementation such as Citrucel along with plenty of fluids and activity as tolerated.  If this is ineffective we discussed adding over-the-counter MiraLAX.     Patient would also like to discuss colon screening colonoscopy.  Patient had colonoscopy in 2009 and 2015 that were both normal with no colonic polyps.  No family history of personal history of colon cancer.  Patient expresses  her main concern is her personal history of uterine cancer and how it can be linked to colon cancer.  She will discuss with her daughter and let us  know if she would like to pursue the colonoscopy.   Plan: -Recommend high fiber diet  -OTC Citrucel 1 tsp po daily  -we discussed pros and cons of proceeding with colonoscopy today, patient would liek to discuss with her daughter and get back to us .   Thank you for the courtesy of this consult. Please call me with any questions or concerns.   Samra Pesch, FNP-C [facility] 06/13/2024, 4:42 PM  Cc: Ozell Heron HERO, MD   I have reviewed the clinic note as outlined by Cathryne Beal, NP and agree with the assessment, plan and medical decision making.  Ms. Brau presents for evaluation and management of mild constipation and to discuss pros/cons of colonoscopy at the age of 47.  Agree that with her mild symptoms it is reasonable to pursue conservative measures.  She does not have significant risk factors that would be worrisome for colorectal cancer and at her age a colonoscopy would not necessarily be indicated.  Patient will review this in further detail with her family to make a decision if she would like to proceed with her procedure.  She does sound to overall be healthy enough if she wishes to have 1 more colonoscopy.  Inocente Hausen, MD

## 2024-06-19 ENCOUNTER — Other Ambulatory Visit: Payer: Self-pay | Admitting: Family Medicine

## 2024-06-19 DIAGNOSIS — G4700 Insomnia, unspecified: Secondary | ICD-10-CM

## 2024-06-25 ENCOUNTER — Telehealth: Payer: Self-pay | Admitting: Gastroenterology

## 2024-06-25 NOTE — Telephone Encounter (Signed)
 Patient wanting to advise she has decided not to continue with colonoscopy at this time. Please advise, thank you

## 2024-07-09 ENCOUNTER — Ambulatory Visit: Payer: Self-pay | Admitting: Allergy and Immunology

## 2024-07-09 ENCOUNTER — Encounter: Payer: Self-pay | Admitting: Allergy and Immunology

## 2024-07-09 VITALS — BP 94/62 | HR 76 | Temp 98.3°F | Resp 16 | Ht 60.43 in | Wt 138.5 lb

## 2024-07-09 DIAGNOSIS — D72824 Basophilia: Secondary | ICD-10-CM

## 2024-07-09 DIAGNOSIS — K219 Gastro-esophageal reflux disease without esophagitis: Secondary | ICD-10-CM | POA: Diagnosis not present

## 2024-07-09 DIAGNOSIS — J4540 Moderate persistent asthma, uncomplicated: Secondary | ICD-10-CM

## 2024-07-09 DIAGNOSIS — J3089 Other allergic rhinitis: Secondary | ICD-10-CM

## 2024-07-09 DIAGNOSIS — D7219 Other eosinophilia: Secondary | ICD-10-CM

## 2024-07-09 MED ORDER — LEVOCETIRIZINE DIHYDROCHLORIDE 5 MG PO TABS: 5.0000 mg | ORAL_TABLET | Freq: Every day | ORAL | 1 refills | Status: DC | PRN

## 2024-07-09 MED ORDER — TRIAMCINOLONE ACETONIDE 55 MCG/ACT NA AERO
1.0000 | INHALATION_SPRAY | Freq: Every day | NASAL | 1 refills | Status: DC
Start: 2024-07-09 — End: 2024-08-06

## 2024-07-09 MED ORDER — SPACER/AERO-HOLDING CHAMBERS DEVI: 1.0000 | 1 refills | Status: AC

## 2024-07-09 MED ORDER — BUDESONIDE-FORMOTEROL FUMARATE 160-4.5 MCG/ACT IN AERO: 2.0000 | INHALATION_SPRAY | Freq: Two times a day (BID) | RESPIRATORY_TRACT | 1 refills | Status: DC

## 2024-07-09 MED ORDER — ALBUTEROL SULFATE HFA 108 (90 BASE) MCG/ACT IN AERS: 2.0000 | INHALATION_SPRAY | RESPIRATORY_TRACT | 1 refills | Status: DC | PRN

## 2024-07-09 NOTE — Progress Notes (Unsigned)
 [place] - [place] - [place] - [place] - [place]   Dear Ozell,  Thank you for referring Kelly Petersen to the [facility] [place]  on 07/09/2024.   Below is a summation of this patient's evaluation and recommendations.  Thank you for your referral. I will keep you informed about this patient's response to treatment.   If you have any questions please do not hesitate to contact me.   Sincerely,  Camellia DOROTHA Denis, MD Allergy / Immunology [facility] [place]    ______________________________________________________________________    NEW PATIENT NOTE  Referring Provider: Ozell Heron HERO, MD Primary Provider: Ozell Heron HERO, MD Date of office visit: 07/09/2024    Subjective:   Chief Complaint:  Kelly Petersen (DOB: 1946-05-17) is a 78 y.o. female who presents to the clinic on 07/09/2024 with a chief complaint of Asthma (Previous physician passed away so she is looking for a new one. ) .     HPI: Tiye presents to this clinic in evaluation of breathing issues.  She states that she has had asthma since she entered her into the fifth decade of life.  Her asthma is manifested as wheezing, which is predominantly inspiratory, coughing, and some shortness of breath.  It is been particularly worse over the course of this past spring.  If she climbs a hill she is completely out of breath.  Heat and humidity appears to precipitate this issue.  Apparently she had skin testing performed in the past by a local allergist which did not identify any hypersensitivity.  She develops these problems even while using Asmanex  at 440 mcg twice a day along with montelukast .  It should be noted that she has a lot of throat clearing.  She has mucus stuck in her throat and she is constantly coughing to clear out her throat.  She does not have any classic symptoms of reflux.  She also has issues with her nose.  She  has some sneezing and some nasal congestion without any anosmia or ugly nasal discharge.  That has been more of a problem over the course of the past year.  She takes montelukast  and levo cetirizine yet still remains symptomatic.  She is not a smoker nor has she ever been a smoker but she did have 17 years of secondhand tobacco smoke exposure.  She does not perform hobbies where she is exposed to particulate matter.  Past Medical History:  Diagnosis Date  . Adrenal mass (HCC) 01/02/2017   -monitored by endocrinologist in the past -reports told did not need further imaging  . Asthma   . Chicken pox   . Depression   . Eczema   . Endometrial cancer (HCC)   . Environmental allergies   . Glaucoma   . Hyperglycemia   . Hyperlipidemia   . Insomnia   . Osteopenia   . Uterine cancer [facility])     Past Surgical History:  Procedure Laterality Date  . ABDOMINAL HYSTERECTOMY  1999   endometrial cancer  . BREAST SURGERY  2003   lumpectomy; benign  . CATARACT EXTRACTION     right eye-April 2021--left eye June 2021  . ECTOPIC PREGNANCY SURGERY      Allergies as of 07/09/2024       Reactions   Other Itching   Azogantrazine - Sulfa drug for UTI        Medication List    Asmanex  (120 Metered Doses) 220 MCG/INH inhaler Generic drug:  mometasone  USE 2 INHALATIONS TWICE A  DAY   aspirin EC 81 MG tablet Take 81 mg by mouth daily. Swallow whole.   CALCIUM  PO Take 600 mg by mouth 2 (two) times daily.   COMBIGAN OP Apply 1 drop to eye 2 (two) times daily.   denosumab  60 MG/ML Sosy injection Commonly known as: PROLIA  Inject 60 mg into the skin every 6 (six) months.   montelukast  10 MG tablet Commonly known as: SINGULAIR  TAKE 1 TABLET BY MOUTH  EVERY NIGHT AT BEDTIME   Myrbetriq 50 MG Tb24 tablet Generic drug: mirabegron ER Take 50 mg by mouth daily.   rosuvastatin  10 MG tablet Commonly known as: CRESTOR  TAKE 1 TABLET BY MOUTH DAILY   sertraline  25 MG tablet Commonly known as:  ZOLOFT  TAKE 1 TABLET BY MOUTH DAILY   Synthroid  25 MCG tablet Generic drug: levothyroxine TAKE 1 TABLET BY MOUTH DAILY  ALTERNATING WITH 2 TABLETS BY  MOUTH DAILY BEFORE BREAKFAST   traZODone  50 MG tablet Commonly known as: DESYREL  TAKE 1/2 TO 1 TABLET BY MOUTH AT BEDTIME AS NEEDED   VITAMIN B 12 PO Take by mouth.    Review of systems negative except as noted in HPI / PMHx or noted below:  Review of Systems  Constitutional: Negative.   HENT: Negative.    Eyes: Negative.   Respiratory: Negative.    Cardiovascular: Negative.   Gastrointestinal: Negative.   Genitourinary: Negative.   Musculoskeletal: Negative.   Skin: Negative.   Neurological: Negative.   Endo/Heme/Allergies: Negative.   Psychiatric/Behavioral: Negative.      Family History  Problem Relation Age of Onset  . Alzheimer's disease Mother 19  . Asthma Mother   . Lung cancer Father 68       worked at ship [employer], [employer] schools  . Cancer Maternal Grandfather 56  . Dementia Paternal Grandmother   . Hyperlipidemia Paternal Grandmother 30  . Lung cancer Paternal Grandfather 79    Social History   Socioeconomic History  . Marital status: Single    Spouse name: Not on file  . Number of children: 1  . Years of education: Not on file  . Highest education level: Bachelor's degree (e.g., BA, AB, BS)  Occupational History  . Occupation: retired  Tobacco Use  . Smoking status: Never    Passive exposure: Past  . Smokeless tobacco: Never  Vaping Use  . Vaping status: Never Used  Substance and Sexual Activity  . Alcohol use: Yes    Comment: a glass of wine every sunday  . Drug use: No  . Sexual activity: Not on file  Other Topics Concern  . Not on file  Social History Narrative   Work or School: cares for grandchild      Home Situation: lives alone      Spiritual Beliefs: Christian, no church currently      Lifestyle: no regular exercise; diet is poor      Social Drivers of Research scientist (physical sciences) Strain: Low Risk  (04/29/2024)   Overall Financial Resource Strain (CARDIA)   . Difficulty of Paying Living Expenses: Not hard at all  Food Insecurity: No Food Insecurity (04/29/2024)   Hunger Vital Sign   . Worried About Programme researcher, broadcasting/film/video in the Last Year: Never true   . Ran Out of Food in the Last Year: Never true  Transportation Needs: No Transportation Needs (04/29/2024)   PRAPARE - Transportation   . Lack of Transportation (Medical): No   .  Lack of Transportation (Non-Medical): No  Physical Activity: Inactive (04/29/2024)   Exercise Vital Sign   . Days of Exercise per Week: 0 days   . Minutes of Exercise per Session: Not on file  Stress: No Stress Concern Present (04/29/2024)   [employer] of Occupational Health - Occupational Stress Questionnaire   . Feeling of Stress: Not at all  Social Connections: Socially Isolated (04/29/2024)   Social Connection and Isolation Panel   . Frequency of Communication with Friends and Family: Three times a week   . Frequency of Social Gatherings with Friends and Family: Three times a week   . Attends Religious Services: Never   . Active Member of Clubs or Organizations: No   . Attends Banker Meetings: Not on file   . Marital Status: Divorced  Catering manager Violence: Not At Risk (11/24/2023)   Humiliation, Afraid, Rape, and Kick questionnaire   . Fear of Current or Ex-Partner: No   . Emotionally Abused: No   . Physically Abused: No   . Sexually Abused: No    Environmental and Social history  Lives in a house with a dry environment, dog located inside the household, no carpet in the bedroom, plastic encasement on bed and pillow, no smoking ongoing with inside the household.  Objective:   Vitals:   07/09/24 1002  BP: 94/62  Pulse: 76  Resp: 16  Temp: 98.3 F (36.8 C)  SpO2: 96%   Height: 5' 0.43 (153.5 cm) Weight: 138 lb 8 oz (62.8 kg)  Physical Exam Constitutional:      Appearance: She is not  diaphoretic.  HENT:     Head: Normocephalic.     Right Ear: Tympanic membrane, ear canal and external ear normal.     Left Ear: Tympanic membrane, ear canal and external ear normal.     Nose: Nose normal. No mucosal edema or rhinorrhea.     Mouth/Throat:     Pharynx: Uvula midline. No oropharyngeal exudate.  Eyes:     Conjunctiva/sclera: Conjunctivae normal.  Neck:     Thyroid : No thyromegaly.     Trachea: Trachea normal. No tracheal tenderness or tracheal deviation.  Cardiovascular:     Rate and Rhythm: Normal rate and regular rhythm.     Heart sounds: Normal heart sounds, S1 normal and S2 normal. No murmur heard. Pulmonary:     Effort: No respiratory distress.     Breath sounds: Normal breath sounds. No stridor. No wheezing or rales.  Lymphadenopathy:     Head:     Right side of head: No tonsillar adenopathy.     Left side of head: No tonsillar adenopathy.     Cervical: No cervical adenopathy.  Skin:    Findings: No erythema or rash.     Nails: There is no clubbing.  Neurological:     Mental Status: She is alert.     Diagnostics: Allergy skin tests were performed.   Spirometry was performed and demonstrated an FEV1 of 1.10 @ 65 % of predicted. FEV1/FVC = 0.71  Results of blood tests obtained 12 November 2021 identifies WBC 8.7, absolute eosinophil 800, absolute basophil 200, hemoglobin 12.5, platelet 223  Results of a chest x-ray obtained 26 June 2023 identifies the following:  Cardiac silhouette is normal in size. Normal mediastinal and hilar contours. Clear lungs.  No pleural effusion or pneumothorax.   Assessment and Plan:    1. Not well controlled moderate persistent asthma   2. Other allergic rhinitis  3. Other eosinophilia   4. Basophilia     Patient Instructions   1. Blood - CBC w/d, area 2 aeroallergen IgE profile   2. Treat and prevent inflammation of upper airway:   A. OTC Nasacort  - 1 spray each nostril 1 time per day  B. Stop Montelukast     3. Treat and prevent inflammation of lower airway:   A. Symbicort  160 - 2 inhalations 2 times per day w/ spacer (empty lungs)  B. Stop Asmanex   4. Treat and prevent inflammation of throat:   A. Replace throat clearing with drinking / swallowing maneuver  B. Minimize caffeine, chocolate, alcohol (LPR???)  5. If needed:   A. Albuterol  - 2 inhalations every 6 hours  B. Levo cetirizine 5 mg - 1 tablet 1 time per day (does this help???)  6. Influenza = Tamiflu . Covid = Paxlovid  7. Return in 4 weeks or earlier if problem   Camellia DOROTHA Denis, MD Allergy / Immunology [facility] [place] 

## 2024-07-09 NOTE — Patient Instructions (Addendum)
  1. Blood - CBC w/d, area 2 aeroallergen IgE profile   2. Treat and prevent inflammation of upper airway:   A. OTC Nasacort  - 1 spray each nostril 1 time per day  B. Stop Montelukast    3. Treat and prevent inflammation of lower airway:   A. Symbicort  160 - 2 inhalations 2 times per day w/ spacer (empty lungs)  B. Stop Asmanex   4. Treat and prevent inflammation of throat:   A. Replace throat clearing with drinking / swallowing maneuver  B. Minimize caffeine, chocolate, alcohol (LPR???)  5. If needed:   A. Albuterol  - 2 inhalations every 6 hours  B. Levo cetirizine 5 mg - 1 tablet 1 time per day (does this help???)  6. Influenza = Tamiflu . Covid = Paxlovid  7. Return in 4 weeks or earlier if problem

## 2024-07-10 ENCOUNTER — Encounter: Payer: Self-pay | Admitting: Allergy and Immunology

## 2024-07-12 LAB — CBC WITH DIFFERENTIAL/PLATELET
Basophils Absolute: 0.1 x10E3/uL (ref 0.0–0.2)
Basos: 1 %
EOS (ABSOLUTE): 0.6 x10E3/uL — ABNORMAL HIGH (ref 0.0–0.4)
Eos: 7 %
Hematocrit: 42.4 % (ref 34.0–46.6)
Hemoglobin: 13.7 g/dL (ref 11.1–15.9)
Immature Grans (Abs): 0 x10E3/uL (ref 0.0–0.1)
Immature Granulocytes: 0 %
Lymphocytes Absolute: 2.2 x10E3/uL (ref 0.7–3.1)
Lymphs: 27 %
MCH: 32.3 pg (ref 26.6–33.0)
MCHC: 32.3 g/dL (ref 31.5–35.7)
MCV: 100 fL — ABNORMAL HIGH (ref 79–97)
Monocytes Absolute: 0.7 x10E3/uL (ref 0.1–0.9)
Monocytes: 9 %
Neutrophils Absolute: 4.8 x10E3/uL (ref 1.4–7.0)
Neutrophils: 56 %
Platelets: 254 x10E3/uL (ref 150–450)
RBC: 4.24 x10E6/uL (ref 3.77–5.28)
RDW: 12.7 % (ref 11.7–15.4)
WBC: 8.5 x10E3/uL (ref 3.4–10.8)

## 2024-07-12 LAB — ALLERGENS W/TOTAL IGE AREA 2
Alternaria Alternata IgE: <0.1 kU/L
Aspergillus Fumigatus IgE: <0.1 kU/L
Bermuda Grass IgE: <0.1 kU/L
Cat Dander IgE: <0.1 kU/L
Cedar, Mountain IgE: <0.1 kU/L
Cladosporium Herbarum IgE: <0.1 kU/L
Cockroach, German IgE: <0.1 kU/L
Common Silver Birch IgE: <0.1 kU/L
Cottonwood IgE: <0.1 kU/L
D Farinae IgE: <0.1 kU/L
D Pteronyssinus IgE: <0.1 kU/L
Dog Dander IgE: <0.1 kU/L
Elm, American IgE: <0.1 kU/L
IgE (Immunoglobulin E), Serum: <2 [IU]/mL — AB (ref 6–495)
Johnson Grass IgE: <0.1 kU/L
Maple/Box Elder IgE: <0.1 kU/L
Mouse Urine IgE: <0.1 kU/L
Oak, White IgE: <0.1 kU/L
Pecan, Hickory IgE: <0.1 kU/L
Penicillium Chrysogen IgE: <0.1 kU/L
Pigweed, Rough IgE: <0.1 kU/L
Ragweed, Short IgE: <0.1 kU/L
Sheep Sorrel IgE Qn: <0.1 kU/L
Timothy Grass IgE: <0.1 kU/L
White Mulberry IgE: <0.1 kU/L

## 2024-07-15 ENCOUNTER — Ambulatory Visit: Payer: Self-pay | Admitting: Allergy and Immunology

## 2024-07-15 ENCOUNTER — Telehealth: Payer: Self-pay

## 2024-07-15 NOTE — Telephone Encounter (Signed)
 I called the patient and informed of results. The patient mentioned to me that she does not have a spacer. She asked where she could get one I informed [employer] has some and we have some in office as well. I informed the patient that it looks like a spacer was sent into [facility]. The patient mentioned she would reach out to them to see what happened. She said if they don't have it she will come by the [facility] office to pick up a spacer tomorrow 07/16/24 around 11 to pick one up. Forwarding to the [facility] clinical pool to make aware.

## 2024-07-29 ENCOUNTER — Other Ambulatory Visit: Payer: Self-pay | Admitting: *Deleted

## 2024-07-29 DIAGNOSIS — M810 Age-related osteoporosis without current pathological fracture: Secondary | ICD-10-CM

## 2024-08-05 ENCOUNTER — Ambulatory Visit: Admitting: Family

## 2024-08-06 ENCOUNTER — Ambulatory Visit: Admitting: Allergy and Immunology

## 2024-08-06 ENCOUNTER — Encounter: Payer: Self-pay | Admitting: Allergy and Immunology

## 2024-08-06 ENCOUNTER — Other Ambulatory Visit: Payer: Self-pay

## 2024-08-06 VITALS — BP 132/78 | HR 83 | Temp 98.3°F | Ht 60.0 in | Wt 138.0 lb

## 2024-08-06 DIAGNOSIS — J4540 Moderate persistent asthma, uncomplicated: Secondary | ICD-10-CM | POA: Diagnosis not present

## 2024-08-06 DIAGNOSIS — D7219 Other eosinophilia: Secondary | ICD-10-CM

## 2024-08-06 DIAGNOSIS — K219 Gastro-esophageal reflux disease without esophagitis: Secondary | ICD-10-CM

## 2024-08-06 DIAGNOSIS — J3089 Other allergic rhinitis: Secondary | ICD-10-CM

## 2024-08-06 MED ORDER — BENRALIZUMAB 30 MG/ML ~~LOC~~ SOSY
30.0000 mg | PREFILLED_SYRINGE | SUBCUTANEOUS | Status: AC
  Administered 2024-08-06: 30 mg via SUBCUTANEOUS

## 2024-08-06 MED ORDER — TRIAMCINOLONE ACETONIDE 55 MCG/ACT NA AERO: 1.0000 | INHALATION_SPRAY | Freq: Every day | NASAL | 1 refills | Status: AC

## 2024-08-06 MED ORDER — LEVOCETIRIZINE DIHYDROCHLORIDE 5 MG PO TABS: 5.0000 mg | ORAL_TABLET | Freq: Every day | ORAL | 1 refills | Status: DC | PRN

## 2024-08-06 MED ORDER — BUDESONIDE-FORMOTEROL FUMARATE 160-4.5 MCG/ACT IN AERO: 2.0000 | INHALATION_SPRAY | Freq: Two times a day (BID) | RESPIRATORY_TRACT | 1 refills | Status: DC

## 2024-08-06 MED ORDER — ALBUTEROL SULFATE HFA 108 (90 BASE) MCG/ACT IN AERS: 2.0000 | INHALATION_SPRAY | RESPIRATORY_TRACT | 1 refills | Status: DC | PRN

## 2024-08-06 NOTE — Progress Notes (Unsigned)
 Immunotherapy   Patient Details  Name: Kelly Petersen MRN: [id number] Date of Birth: [birth year]  08/06/2024  [name] started injections for  Fasenra Following schedule: Every twenty eight days for three injections then every sixty days thereafter.  Frequency:Every four weeks for three injections and then every eight weeks thereafter.  Epi-Pen:Not needed.  Consent signed in office today and patient instructions given.Patient sat in room seven for fifteen minutes without an issue.    [name] 08/06/2024, 3:43 PM

## 2024-08-06 NOTE — Progress Notes (Unsigned)
 [place] - [place] - [place] - [place] - [place]   Follow-up Note  Referring Provider: Ozell Heron HERO, MD Primary Provider: Ozell Heron HERO, MD Date of Office Visit: 08/06/2024  Subjective:   Kelly Petersen (DOB: 11/28/1945) is a 78 y.o. female who [facility] 08[facility]06/2024 in re-evaluation of the following:  HPI: Kelly Petersen presents to this clinic in evaluation of asthma, rhinitis, LPR, eosinophilia.  I last saw her in this clinic during her initial evaluation of 09 July 2024.  She still gets really short of breath if she exerts herself.  She has used a short acting bronchodilator which does appear to help this issue.  This occurs even while using Symbicort  twice a day.  Since starting Symbicort  she has developed more hoarseness.  She says he had less throat clearing as she is very cognizant about not clearing her throat at this point in time as instructed during her last visit.  She has had very little problems with her nose.  She was able to discontinue montelukast  and it did not really appear to make much difference regarding her respiratory tract symptoms.  Allergies as of 08/06/2024       Reactions   Other Itching   Azogantrazine - Sulfa drug for UTI        Medication List    albuterol  108 (90 Base) MCG/ACT inhaler Commonly known as: Ventolin  HFA Inhale 2 puffs into the lungs every 4 (four) hours as needed for wheezing or shortness of breath.   aspirin EC 81 MG tablet Take 81 mg by mouth daily. Swallow whole.   budesonide -formoterol  160-4.5 MCG/ACT inhaler Commonly known as: Symbicort  Inhale 2 puffs into the lungs 2 (two) times daily. With spacer   CALCIUM  PO Take 600 mg by mouth 2 (two) times daily.   COMBIGAN OP Apply 1 drop to eye 2 (two) times daily.   denosumab  60 MG/ML Sosy injection Commonly known as: PROLIA  Inject 60 mg into the skin every 6 (six) months.   levocetirizine 5 MG tablet Commonly known  as: XYZAL  Take 1 tablet (5 mg total) by mouth daily as needed (Can take an etxra dose during flare ups.).   Myrbetriq 50 MG Tb24 tablet Generic drug: mirabegron ER Take 50 mg by mouth daily.   rosuvastatin  5 MG tablet Commonly known as: CRESTOR  Take 5 mg by mouth daily.   rosuvastatin  10 MG tablet Commonly known as: CRESTOR  TAKE 1 TABLET BY MOUTH DAILY   sertraline  25 MG tablet Commonly known as: ZOLOFT  TAKE 1 TABLET BY MOUTH DAILY   Spacer/Aero-Holding Kelly Petersen 1 Device by Does not apply route as directed.   Synthroid  25 MCG tablet Generic drug: levothyroxine TAKE 1 TABLET BY MOUTH DAILY  ALTERNATING WITH 2 TABLETS BY  MOUTH DAILY BEFORE BREAKFAST   traZODone  50 MG tablet Commonly known as: DESYREL  TAKE 1/2 TO 1 TABLET BY MOUTH AT BEDTIME AS NEEDED   triamcinolone  55 MCG/ACT Aero nasal inhaler Commonly known as: NASACORT  Place 1 spray into the nose daily.   VITAMIN B 12 PO Take by mouth.    Past Medical History:  Diagnosis Date   Adrenal mass 01/02/2017   -monitored by endocrinologist in the past -reports told did not need further imaging   Asthma    Chicken pox    Depression    Eczema    Endometrial cancer (HCC)    Environmental allergies    Glaucoma    Hyperglycemia    Hyperlipidemia  Insomnia    Osteopenia    Uterine cancer [facility])     Past Surgical History:  Procedure Laterality Date   ABDOMINAL HYSTERECTOMY  1999   endometrial cancer   BREAST SURGERY  2003   lumpectomy; benign   CATARACT EXTRACTION     right eye-April 2021--left eye June 2021   ECTOPIC PREGNANCY SURGERY      Review of systems negative except as noted in HPI / PMHx or noted below:  Review of Systems  Constitutional: Negative.   HENT: Negative.    Eyes: Negative.   Respiratory: Negative.    Cardiovascular: Negative.   Gastrointestinal: Negative.   Genitourinary: Negative.   Musculoskeletal: Negative.   Skin: Negative.   Neurological: Negative.    Endo/Heme/Allergies: Negative.   Psychiatric/Behavioral: Negative.       Objective:   Vitals:   08/06/24 1536  BP: 132/78  Pulse: 83  Temp: 98.3 F (36.8 C)  SpO2: 96%   Height: 5' (152.4 cm)  Weight: 138 lb (62.6 kg)   Physical Exam Constitutional:      Appearance: She is not diaphoretic.  HENT:     Head: Normocephalic.     Right Ear: Tympanic membrane, ear canal and external ear normal.     Left Ear: Tympanic membrane, ear canal and external ear normal.     Nose: Nose normal. No mucosal edema or rhinorrhea.     Mouth/Throat:     Pharynx: Uvula midline. No oropharyngeal exudate.  Eyes:     Conjunctiva/sclera: Conjunctivae normal.  Neck:     Thyroid : No thyromegaly.     Trachea: Trachea normal. No tracheal tenderness or tracheal deviation.  Cardiovascular:     Rate and Rhythm: Normal rate and regular rhythm.     Heart sounds: Normal heart sounds, S1 normal and S2 normal. No murmur heard. Pulmonary:     Effort: No respiratory distress.     Breath sounds: Normal breath sounds. No stridor. No wheezing or rales.  Lymphadenopathy:     Head:     Right side of head: No tonsillar adenopathy.     Left side of head: No tonsillar adenopathy.     Cervical: No cervical adenopathy.  Skin:    Findings: No erythema or rash.     Nails: There is no clubbing.  Neurological:     Mental Status: She is alert.     Diagnostics:    Spirometry was performed and demonstrated an FEV1 of 1.10 at 65 % of predicted.  Results of blood tests obtained 09 July 2024 identifies WBC 8.5, absolute eosinophil 600, absolute basophil 100 absolute lymphocyte 2200, hemoglobin 13.7, platelet 254, IgE less than 2 KU/L, no antigen specific IgE antibodies on an area two aeroallergen profile.  Assessment and Plan:   1. Not well controlled moderate persistent asthma   2. Other allergic rhinitis   3. Other eosinophilia   4. LPRD (laryngopharyngeal reflux disease)    1.  Treat and prevent  inflammation airway:   A. OTC Nasacort  - 1 spray each nostril 1 time per day  B. Symbicort  160 - 2 inhalations 1-2 times per day w/ spacer (empty lungs)  C. Start Benralizumab injections today.  4. Treat and prevent inflammation of throat:   A. Replace throat clearing with drinking / swallowing maneuver  B. Minimize caffeine, chocolate, alcohol (LPR???)  5. If needed   A. Albuterol  - 2 inhalations every 6 hours  B. Levo cetirizine 5 mg - 1 tablet 1 time per day  6. Influenza = Tamiflu . Covid = Paxlovid  7. Return in 8 weeks or earlier if problem  Nahlia has failed medical therapy and she will be starting an anti-IL-5 receptor biologic agent to deal with her eosinophilic airway inflammation using benralizumab.  We need to lower her Symbicort  as twice a day Symbicort  is giving rise to some hoarseness.  Will see her back in this clinic in 8 weeks to make a determination about further evaluation and treatment based upon her response to this approach.  Kelly Denis, MD Allergy / Immunology [facility]

## 2024-08-06 NOTE — Patient Instructions (Addendum)
  1.  Treat and prevent inflammation airway:   A. OTC Nasacort  - 1 spray each nostril 1 time per day  B. Symbicort  160 - 2 inhalations 1-2 times per day w/ spacer (empty lungs)  C. Start Benralizumab injections today.  4. Treat and prevent inflammation of throat:   A. Replace throat clearing with drinking / swallowing maneuver  B. Minimize caffeine, chocolate, alcohol (LPR???)  5. If needed   A. Albuterol  - 2 inhalations every 6 hours  B. Levo cetirizine 5 mg - 1 tablet 1 time per day    6. Influenza = Tamiflu . Covid = Paxlovid  7. Return in 8 weeks or earlier if problem

## 2024-08-07 ENCOUNTER — Encounter: Payer: Self-pay | Admitting: Allergy and Immunology

## 2024-08-09 NOTE — Telephone Encounter (Signed)
 [facility] [employer]  Patient is ready for scheduling on or after: 08/16/24  Out-of-pocket cost due at time of visit: $10    Primary: [employer] [place]  co-insurance: $0 Admin fee: $10  Deductible: n/a  Prior Auth: Approved PA #: [id number] Valid: 02/06/24 - 02/05/25    ** This summary of benefits is an estimation of the patient's out-of-pocket cost. Exact cost may vary based on individual plan coverage.

## 2024-08-10 LAB — BASIC METABOLIC PANEL WITH GFR
BUN/Creatinine Ratio: 13 (ref 12–28)
BUN: 14 mg/dL (ref 8–27)
CO2: 26 mmol/L (ref 20–29)
Calcium: 9.7 mg/dL (ref 8.7–10.3)
Chloride: 97 mmol/L (ref 96–106)
Creatinine, Ser: 1.08 mg/dL — ABNORMAL HIGH (ref 0.57–1.00)
Glucose: 100 mg/dL — ABNORMAL HIGH (ref 70–99)
Potassium: 5 mmol/L (ref 3.5–5.2)
Sodium: 137 mmol/L (ref 134–144)
eGFR: 52 mL/min/1.73 — ABNORMAL LOW (ref >59)

## 2024-08-10 LAB — VITAMIN D 25 HYDROXY (VIT D DEFICIENCY, FRACTURES): Vit D, 25-Hydroxy: 45.2 ng/mL (ref 30.0–100.0)

## 2024-08-12 ENCOUNTER — Ambulatory Visit: Payer: Self-pay | Admitting: Family Medicine

## 2024-08-12 ENCOUNTER — Telehealth: Payer: Self-pay | Admitting: *Deleted

## 2024-08-12 NOTE — Telephone Encounter (Signed)
 L/m for patient to contact me regarding Fasenra and affordablilty

## 2024-08-14 NOTE — Telephone Encounter (Signed)
 Patient called 10/7 and I reached back out to her and l/m to contact me

## 2024-08-14 NOTE — Telephone Encounter (Signed)
 Spoke to patient and due to income will email her consent for to fill out and send back to me to try and get her on free Fasenra

## 2024-08-16 ENCOUNTER — Ambulatory Visit: Admitting: Family Medicine

## 2024-08-16 DIAGNOSIS — M810 Age-related osteoporosis without current pathological fracture: Secondary | ICD-10-CM | POA: Diagnosis not present

## 2024-08-16 MED ORDER — DENOSUMAB 60 MG/ML ~~LOC~~ SOSY
60.0000 mg | PREFILLED_SYRINGE | Freq: Once | SUBCUTANEOUS | Status: AC
  Administered 2024-08-16: 60 mg via SUBCUTANEOUS

## 2024-08-16 NOTE — Progress Notes (Signed)
Patient given [place] prolia injection 60mg/ml in her left arm. Patient tolerated injection well without reaction at the injection site. Patient will schedule next injection, which is 6 months from today.  

## 2024-08-22 ENCOUNTER — Other Ambulatory Visit: Payer: Self-pay | Admitting: Family Medicine

## 2024-09-03 NOTE — Telephone Encounter (Signed)
 Spoke to patient and advised she doesn't qualify for PAP due to income and unfortunately all the bio PAP programs go by the same income requirement. Discussed OOP vs PPP starting 2026. She is going to discuss with Dr Maurilio at next visit

## 2024-09-30 ENCOUNTER — Other Ambulatory Visit: Payer: Self-pay | Admitting: Family Medicine

## 2024-09-30 DIAGNOSIS — E785 Hyperlipidemia, unspecified: Secondary | ICD-10-CM

## 2024-10-01 ENCOUNTER — Ambulatory Visit: Admitting: Allergy and Immunology

## 2024-10-01 VITALS — BP 102/60 | Temp 98.0°F | Ht 62.0 in | Wt 138.1 lb

## 2024-10-01 DIAGNOSIS — K219 Gastro-esophageal reflux disease without esophagitis: Secondary | ICD-10-CM | POA: Diagnosis not present

## 2024-10-01 DIAGNOSIS — J3089 Other allergic rhinitis: Secondary | ICD-10-CM

## 2024-10-01 DIAGNOSIS — D7219 Other eosinophilia: Secondary | ICD-10-CM

## 2024-10-01 DIAGNOSIS — J4540 Moderate persistent asthma, uncomplicated: Secondary | ICD-10-CM

## 2024-10-01 MED ORDER — PANTOPRAZOLE SODIUM 40 MG PO TBEC: 40.0000 mg | DELAYED_RELEASE_TABLET | Freq: Every day | ORAL | 2 refills | Status: DC

## 2024-10-01 MED ORDER — ALBUTEROL SULFATE HFA 108 (90 BASE) MCG/ACT IN AERS: 2.0000 | INHALATION_SPRAY | RESPIRATORY_TRACT | 1 refills | Status: AC | PRN

## 2024-10-01 MED ORDER — LEVOCETIRIZINE DIHYDROCHLORIDE 5 MG PO TABS: 5.0000 mg | ORAL_TABLET | Freq: Every day | ORAL | 1 refills | Status: AC | PRN

## 2024-10-01 MED ORDER — BUDESONIDE-FORMOTEROL FUMARATE 160-4.5 MCG/ACT IN AERO: 2.0000 | INHALATION_SPRAY | Freq: Two times a day (BID) | RESPIRATORY_TRACT | 1 refills | Status: AC

## 2024-10-01 NOTE — Patient Instructions (Signed)
  1.  Treat and prevent inflammation airway:   A. OTC Nasacort  - 1 spray each nostril 1 time per day  B. Symbicort  160 - 2 inhalations 1-2 times per day w/ spacer (empty lungs)  2. Treat and prevent inflammation of throat:   A. Replace throat clearing with drinking / swallowing maneuver  B. Minimize caffeine, chocolate, alcohol   C. START pantoprazole  40 mg - 1 tablet 1 time per day  D. Evaluation of throat with ENT???  3. If needed   A. Albuterol  - 2 inhalations every 6 hours  B. Levo cetirizine 5 mg - 1 tablet 1 time per day    4. Influenza = Tamiflu . Covid = Paxlovid  5. Return in 12 weeks or earlier if problem

## 2024-10-01 NOTE — Progress Notes (Unsigned)
 [place] - [place] - [place] - [place] - [place]   Follow-up Note  Referring Provider: Ozell Heron HERO, MD Primary Provider: Ozell Heron HERO, MD Date of Office Visit: 10/01/2024  Subjective:   Kelly Petersen (DOB: 10/27/1946) is a 78 y.o. female who [facility] in re-evaluation of the following:  HPI: Kelly Petersen returns to this clinic in evaluation of asthma, rhinitis, LPR, eosinophilia.  I last saw her in this clinic 06 August 2024.  When we last saw her in this clinic she was having laryngitis from using Symbicort  twice a day and we decreased her dose to 1 time per day and that has resolved her laryngitis.  However, she still has a sensation that there is a coating in the back of her throat.  She still does a little bit of throat clearing.  She has eliminated use of caffeine but she has continued to eat chocolate on occasion.  She believes that her asthma is actually doing relatively well at this point.  She does not really get short of breath now when she exerts herself.  She has minimal use for short acting bronchodilator.  As noted above she continues to use Symbicort  1 time per day.  Previously we did offer a biologic for her eosinophilia but it was just too expensive to obtain that agent.  She has not had any problems with her upper airway.  Allergies as of 10/01/2024       Reactions   Other Itching   Azogantrazine - Sulfa drug for UTI        Medication List    albuterol  108 (90 Base) MCG/ACT inhaler Commonly known as: Ventolin  HFA Inhale 2 puffs into the lungs every 4 (four) hours as needed for wheezing or shortness of breath.   aspirin EC 81 MG tablet Take 81 mg by mouth daily. Swallow whole.   budesonide -formoterol  160-4.5 MCG/ACT inhaler Commonly known as: Symbicort  Inhale 2 puffs into the lungs 2 (two) times daily. With spacer   CALCIUM  PO Take 600 mg by mouth 2 (two) times daily.   clobetasol 0.05  % external solution Commonly known as: TEMOVATE   COMBIGAN OP Apply 1 drop to eye 2 (two) times daily.   levocetirizine 5 MG tablet Commonly known as: XYZAL  Take 1 tablet (5 mg total) by mouth daily as needed (Can take an etxra dose during flare ups.).   Myrbetriq 50 MG Tb24 tablet Generic drug: mirabegron ER Take 50 mg by mouth daily.   rosuvastatin  10 MG tablet Commonly known as: CRESTOR  TAKE 1 TABLET BY MOUTH DAILY   sertraline  25 MG tablet Commonly known as: ZOLOFT  TAKE 1 TABLET BY MOUTH DAILY   Spacer/Aero-Holding Raguel French 1 Device by Does not apply route as directed.   Synthroid  25 MCG tablet Generic drug: levothyroxine TAKE 1 TABLET BY MOUTH DAILY  ALTERNATING WITH 2 TABLETS DAILY BEFORE BREAKFAST   traZODone  50 MG tablet Commonly known as: DESYREL  TAKE 1/2 TO 1 TABLET BY MOUTH AT BEDTIME AS NEEDED   triamcinolone  55 MCG/ACT Aero nasal inhaler Commonly known as: NASACORT  Place 1 spray into the nose daily.   VITAMIN B 12 PO Take by mouth.    Past Medical History:  Diagnosis Date   Adrenal mass 01/02/2017   -monitored by endocrinologist in the past -reports told did not need further imaging   Asthma    Chicken pox    Depression    Eczema    Endometrial cancer (  HCC)    Environmental allergies    Glaucoma    Hyperglycemia    Hyperlipidemia    Insomnia    Osteopenia    Uterine cancer [facility])     Past Surgical History:  Procedure Laterality Date   ABDOMINAL HYSTERECTOMY  1999   endometrial cancer   BREAST SURGERY  2003   lumpectomy; benign   CATARACT EXTRACTION     right eye-April 2021--left eye June 2021   ECTOPIC PREGNANCY SURGERY      Review of systems negative except as noted in HPI / PMHx or noted below:  Review of Systems  Constitutional: Negative.   HENT: Negative.    Eyes: Negative.   Respiratory: Negative.    Cardiovascular: Negative.   Gastrointestinal: Negative.   Genitourinary: Negative.   Musculoskeletal: Negative.    Skin: Negative.   Neurological: Negative.   Endo/Heme/Allergies: Negative.   Psychiatric/Behavioral: Negative.       Objective:   Vitals:   10/01/24 1130  BP: 102/60  Temp: 98 F (36.7 C)   Height: 5' 2 (157.5 cm)  Weight: 138 lb 1.6 oz (62.6 kg)   Physical Exam Constitutional:      Appearance: She is not diaphoretic.  HENT:     Head: Normocephalic.     Right Ear: Tympanic membrane, ear canal and external ear normal.     Left Ear: Tympanic membrane, ear canal and external ear normal.     Nose: Nose normal. No mucosal edema or rhinorrhea.     Mouth/Throat:     Pharynx: Uvula midline. No oropharyngeal exudate.  Eyes:     Conjunctiva/sclera: Conjunctivae normal.  Neck:     Thyroid : No thyromegaly.     Trachea: Trachea normal. No tracheal tenderness or tracheal deviation.  Cardiovascular:     Rate and Rhythm: Normal rate and regular rhythm.     Heart sounds: Normal heart sounds, S1 normal and S2 normal. No murmur heard. Pulmonary:     Effort: No respiratory distress.     Breath sounds: Normal breath sounds. No stridor. No wheezing or rales.  Lymphadenopathy:     Head:     Right side of head: No tonsillar adenopathy.     Left side of head: No tonsillar adenopathy.     Cervical: No cervical adenopathy.  Skin:    Findings: No erythema or rash.     Nails: There is no clubbing.  Neurological:     Mental Status: She is alert.     Diagnostics: Spirometry was performed and demonstrated an FEV1 of 1.13 at 67 % of predicted.  Assessment and Plan:   1. Asthma, moderate persistent, well-controlled   2. Other allergic rhinitis   3. LPRD (laryngopharyngeal reflux disease)   4. Other eosinophilia    1.  Treat and prevent inflammation airway:   A. OTC Nasacort  - 1 spray each nostril 1 time per day  B. Symbicort  160 - 2 inhalations 1-2 times per day w/ spacer (empty lungs)  2. Treat and prevent inflammation of throat:   A. Replace throat clearing with drinking /  swallowing maneuver  B. Minimize caffeine, chocolate, alcohol   C. START pantoprazole  40 mg - 1 tablet 1 time per day  D. Evaluation of throat with ENT???  3. If needed   A. Albuterol  - 2 inhalations every 6 hours  B. Levo cetirizine 5 mg - 1 tablet 1 time per day    4. Influenza = Tamiflu . Covid = Paxlovid  5. Return in 12  weeks or earlier if problem  My will remain on some nasal steroids and combination inhaler utilizing Symbicort  just 1 time per day at this point and we will see how things go over the course the next 12 weeks regarding her asthma control and nasal issues.  I am going to start her on a proton pump inhibitor for what appears to be some LPR and we will see how things go over the next few months.  I will see her back in this clinic in 12 weeks or earlier if there is a problem.  Camellia Denis, MD Allergy / Immunology [facility]

## 2024-10-02 ENCOUNTER — Encounter: Payer: Self-pay | Admitting: Allergy and Immunology

## 2024-11-11 ENCOUNTER — Other Ambulatory Visit: Payer: Self-pay | Admitting: Family Medicine

## 2024-12-04 ENCOUNTER — Other Ambulatory Visit: Payer: Self-pay | Admitting: Allergy and Immunology

## 2024-12-04 DIAGNOSIS — K219 Gastro-esophageal reflux disease without esophagitis: Secondary | ICD-10-CM

## 2025-01-07 ENCOUNTER — Ambulatory Visit: Admitting: Allergy and Immunology

## 2025-02-18 ENCOUNTER — Ambulatory Visit: Admitting: Family Medicine
